# Patient Record
Sex: Male | Born: 1940 | Race: White | Hispanic: No | Smoking: Former smoker
Health system: Southern US, Community
[De-identification: ages and names within clinical notes are randomized; demographics above are authoritative.]

## PROBLEM LIST (undated history)

## (undated) DIAGNOSIS — I351 Nonrheumatic aortic (valve) insufficiency: Secondary | ICD-10-CM

## (undated) DIAGNOSIS — R9430 Abnormal result of cardiovascular function study, unspecified: Secondary | ICD-10-CM

## (undated) DIAGNOSIS — K635 Polyp of colon: Secondary | ICD-10-CM

## (undated) DIAGNOSIS — I10 Essential (primary) hypertension: Secondary | ICD-10-CM

## (undated) DIAGNOSIS — R251 Tremor, unspecified: Secondary | ICD-10-CM

## (undated) DIAGNOSIS — I471 Supraventricular tachycardia, unspecified: Secondary | ICD-10-CM

## (undated) DIAGNOSIS — K2270 Barrett's esophagus without dysplasia: Secondary | ICD-10-CM

## (undated) DIAGNOSIS — C439 Malignant melanoma of skin, unspecified: Secondary | ICD-10-CM

## (undated) DIAGNOSIS — Z86010 Personal history of colonic polyps: Secondary | ICD-10-CM

## (undated) DIAGNOSIS — I340 Nonrheumatic mitral (valve) insufficiency: Secondary | ICD-10-CM

## (undated) DIAGNOSIS — I480 Paroxysmal atrial fibrillation: Secondary | ICD-10-CM

## (undated) DIAGNOSIS — N400 Enlarged prostate without lower urinary tract symptoms: Secondary | ICD-10-CM

## (undated) DIAGNOSIS — I4510 Unspecified right bundle-branch block: Secondary | ICD-10-CM

## (undated) DIAGNOSIS — IMO0002 Reserved for concepts with insufficient information to code with codable children: Secondary | ICD-10-CM

## (undated) DIAGNOSIS — N200 Calculus of kidney: Secondary | ICD-10-CM

## (undated) DIAGNOSIS — K219 Gastro-esophageal reflux disease without esophagitis: Secondary | ICD-10-CM

## (undated) HISTORY — DX: Essential (primary) hypertension: I10

## (undated) HISTORY — DX: Tremor, unspecified: R25.1

## (undated) HISTORY — DX: Barrett's esophagus without dysplasia: K22.70

## (undated) HISTORY — DX: Benign prostatic hyperplasia without lower urinary tract symptoms: N40.0

## (undated) HISTORY — DX: Unspecified right bundle-branch block: I45.10

## (undated) HISTORY — DX: Reserved for concepts with insufficient information to code with codable children: IMO0002

## (undated) HISTORY — PX: NISSEN FUNDOPLICATION: SHX2091

## (undated) HISTORY — DX: Calculus of kidney: N20.0

## (undated) HISTORY — DX: Supraventricular tachycardia, unspecified: I47.10

## (undated) HISTORY — DX: Abnormal result of cardiovascular function study, unspecified: R94.30

## (undated) HISTORY — DX: Gastro-esophageal reflux disease without esophagitis: K21.9

## (undated) HISTORY — DX: Paroxysmal atrial fibrillation: I48.0

## (undated) HISTORY — DX: Nonrheumatic aortic (valve) insufficiency: I35.1

## (undated) HISTORY — DX: Polyp of colon: K63.5

## (undated) HISTORY — DX: Supraventricular tachycardia: I47.1

## (undated) HISTORY — DX: Personal history of colonic polyps: Z86.010

## (undated) HISTORY — DX: Nonrheumatic mitral (valve) insufficiency: I34.0

## (undated) HISTORY — DX: Malignant melanoma of skin, unspecified: C43.9

---

## 1964-02-26 HISTORY — PX: TONSILLECTOMY: SUR1361

## 1979-02-26 HISTORY — PX: KIDNEY STONE SURGERY: SHX686

## 1979-02-26 HISTORY — PX: VASECTOMY: SHX75

## 1990-02-25 HISTORY — PX: OTHER SURGICAL HISTORY: SHX169

## 1998-02-14 ENCOUNTER — Other Ambulatory Visit: Admission: RE | Admit: 1998-02-14 | Discharge: 1998-02-14 | Payer: Self-pay | Admitting: Gastroenterology

## 1998-08-22 ENCOUNTER — Encounter (INDEPENDENT_AMBULATORY_CARE_PROVIDER_SITE_OTHER): Payer: Self-pay | Admitting: Specialist

## 1998-08-22 ENCOUNTER — Other Ambulatory Visit: Admission: RE | Admit: 1998-08-22 | Discharge: 1998-08-22 | Payer: Self-pay | Admitting: Gastroenterology

## 1999-01-30 ENCOUNTER — Other Ambulatory Visit: Admission: RE | Admit: 1999-01-30 | Discharge: 1999-01-30 | Payer: Self-pay | Admitting: Gastroenterology

## 1999-01-30 ENCOUNTER — Encounter (INDEPENDENT_AMBULATORY_CARE_PROVIDER_SITE_OTHER): Payer: Self-pay

## 1999-02-08 ENCOUNTER — Inpatient Hospital Stay (HOSPITAL_COMMUNITY): Admission: EM | Admit: 1999-02-08 | Discharge: 1999-02-09 | Payer: Self-pay | Admitting: Emergency Medicine

## 1999-02-13 ENCOUNTER — Encounter (INDEPENDENT_AMBULATORY_CARE_PROVIDER_SITE_OTHER): Payer: Self-pay

## 1999-02-13 ENCOUNTER — Observation Stay (HOSPITAL_COMMUNITY): Admission: RE | Admit: 1999-02-13 | Discharge: 1999-02-14 | Payer: Self-pay

## 1999-02-13 HISTORY — PX: CHOLECYSTECTOMY: SHX55

## 1999-03-02 ENCOUNTER — Encounter: Payer: Self-pay | Admitting: Gastroenterology

## 1999-03-02 ENCOUNTER — Ambulatory Visit (HOSPITAL_COMMUNITY): Admission: RE | Admit: 1999-03-02 | Discharge: 1999-03-02 | Payer: Self-pay | Admitting: Gastroenterology

## 1999-03-13 ENCOUNTER — Ambulatory Visit (HOSPITAL_COMMUNITY): Admission: RE | Admit: 1999-03-13 | Discharge: 1999-03-13 | Payer: Self-pay | Admitting: Cardiovascular Disease

## 1999-08-07 ENCOUNTER — Encounter (INDEPENDENT_AMBULATORY_CARE_PROVIDER_SITE_OTHER): Payer: Self-pay | Admitting: Specialist

## 1999-08-07 ENCOUNTER — Other Ambulatory Visit: Admission: RE | Admit: 1999-08-07 | Discharge: 1999-08-07 | Payer: Self-pay | Admitting: Gastroenterology

## 2000-02-07 ENCOUNTER — Encounter (INDEPENDENT_AMBULATORY_CARE_PROVIDER_SITE_OTHER): Payer: Self-pay | Admitting: Specialist

## 2000-02-07 ENCOUNTER — Other Ambulatory Visit: Admission: RE | Admit: 2000-02-07 | Discharge: 2000-02-07 | Payer: Self-pay | Admitting: Gastroenterology

## 2000-07-30 ENCOUNTER — Encounter (INDEPENDENT_AMBULATORY_CARE_PROVIDER_SITE_OTHER): Payer: Self-pay | Admitting: Specialist

## 2000-07-30 ENCOUNTER — Other Ambulatory Visit: Admission: RE | Admit: 2000-07-30 | Discharge: 2000-07-30 | Payer: Self-pay | Admitting: Gastroenterology

## 2002-08-03 ENCOUNTER — Encounter: Payer: Self-pay | Admitting: Internal Medicine

## 2004-01-13 ENCOUNTER — Encounter: Admission: RE | Admit: 2004-01-13 | Discharge: 2004-01-13 | Payer: Self-pay | Admitting: Surgery

## 2004-01-26 ENCOUNTER — Ambulatory Visit: Payer: Self-pay | Admitting: Internal Medicine

## 2004-02-13 ENCOUNTER — Ambulatory Visit: Payer: Self-pay | Admitting: Internal Medicine

## 2004-03-12 ENCOUNTER — Ambulatory Visit: Payer: Self-pay | Admitting: Internal Medicine

## 2004-05-16 ENCOUNTER — Ambulatory Visit: Payer: Self-pay | Admitting: Internal Medicine

## 2004-05-18 ENCOUNTER — Ambulatory Visit: Payer: Self-pay | Admitting: Cardiology

## 2004-08-16 ENCOUNTER — Ambulatory Visit: Payer: Self-pay | Admitting: Internal Medicine

## 2004-08-24 ENCOUNTER — Ambulatory Visit: Payer: Self-pay | Admitting: Internal Medicine

## 2005-05-24 ENCOUNTER — Encounter: Payer: Self-pay | Admitting: Cardiology

## 2005-05-24 ENCOUNTER — Ambulatory Visit: Payer: Self-pay | Admitting: Cardiology

## 2005-05-27 ENCOUNTER — Ambulatory Visit: Payer: Self-pay | Admitting: Cardiology

## 2005-06-05 ENCOUNTER — Ambulatory Visit: Payer: Self-pay | Admitting: Internal Medicine

## 2005-06-10 ENCOUNTER — Ambulatory Visit: Payer: Self-pay | Admitting: Gastroenterology

## 2005-08-13 ENCOUNTER — Ambulatory Visit: Payer: Self-pay | Admitting: Internal Medicine

## 2005-08-22 ENCOUNTER — Ambulatory Visit: Payer: Self-pay | Admitting: Internal Medicine

## 2006-04-17 ENCOUNTER — Ambulatory Visit: Payer: Self-pay | Admitting: Family Medicine

## 2006-06-11 ENCOUNTER — Ambulatory Visit: Payer: Self-pay | Admitting: Cardiology

## 2006-10-13 DIAGNOSIS — Z860100 Personal history of colon polyps, unspecified: Secondary | ICD-10-CM

## 2006-10-13 HISTORY — DX: Personal history of colonic polyps: Z86.010

## 2006-10-13 HISTORY — DX: Personal history of colon polyps, unspecified: Z86.0100

## 2007-05-06 ENCOUNTER — Encounter: Payer: Self-pay | Admitting: Internal Medicine

## 2007-06-04 ENCOUNTER — Ambulatory Visit: Payer: Self-pay | Admitting: Cardiology

## 2007-07-17 ENCOUNTER — Encounter: Payer: Self-pay | Admitting: Internal Medicine

## 2007-07-30 ENCOUNTER — Ambulatory Visit: Payer: Self-pay | Admitting: Internal Medicine

## 2007-07-30 DIAGNOSIS — K219 Gastro-esophageal reflux disease without esophagitis: Secondary | ICD-10-CM | POA: Insufficient documentation

## 2007-07-30 DIAGNOSIS — K22719 Barrett's esophagus with dysplasia, unspecified: Secondary | ICD-10-CM

## 2008-01-25 ENCOUNTER — Encounter: Payer: Self-pay | Admitting: Internal Medicine

## 2008-05-20 ENCOUNTER — Ambulatory Visit: Payer: Self-pay | Admitting: Internal Medicine

## 2008-05-31 DIAGNOSIS — N400 Enlarged prostate without lower urinary tract symptoms: Secondary | ICD-10-CM

## 2008-05-31 DIAGNOSIS — Z85820 Personal history of malignant melanoma of skin: Secondary | ICD-10-CM

## 2008-05-31 DIAGNOSIS — Z87442 Personal history of urinary calculi: Secondary | ICD-10-CM

## 2008-06-01 ENCOUNTER — Ambulatory Visit: Payer: Self-pay | Admitting: Cardiology

## 2008-06-01 ENCOUNTER — Encounter: Payer: Self-pay | Admitting: Cardiology

## 2008-06-21 ENCOUNTER — Encounter: Payer: Self-pay | Admitting: Internal Medicine

## 2008-06-21 ENCOUNTER — Ambulatory Visit: Payer: Self-pay | Admitting: Internal Medicine

## 2008-06-23 ENCOUNTER — Encounter: Payer: Self-pay | Admitting: Internal Medicine

## 2009-01-30 ENCOUNTER — Encounter: Payer: Self-pay | Admitting: Internal Medicine

## 2009-01-30 ENCOUNTER — Encounter: Payer: Self-pay | Admitting: Cardiology

## 2009-02-25 HISTORY — PX: MOHS SURGERY: SUR867

## 2009-04-19 ENCOUNTER — Encounter (INDEPENDENT_AMBULATORY_CARE_PROVIDER_SITE_OTHER): Payer: Self-pay | Admitting: *Deleted

## 2009-06-06 ENCOUNTER — Encounter: Payer: Self-pay | Admitting: Cardiology

## 2009-06-07 ENCOUNTER — Ambulatory Visit: Payer: Self-pay | Admitting: Cardiology

## 2009-08-07 ENCOUNTER — Encounter: Payer: Self-pay | Admitting: Internal Medicine

## 2009-08-14 ENCOUNTER — Encounter: Payer: Self-pay | Admitting: Internal Medicine

## 2009-08-18 ENCOUNTER — Telehealth: Payer: Self-pay | Admitting: Internal Medicine

## 2009-09-07 ENCOUNTER — Ambulatory Visit: Payer: Self-pay | Admitting: Internal Medicine

## 2009-09-07 LAB — CONVERTED CEMR LAB
ALT: 19 units/L (ref 0–53)
AST: 22 units/L (ref 0–37)
Albumin: 4.2 g/dL (ref 3.5–5.2)
Alkaline Phosphatase: 54 units/L (ref 39–117)
BUN: 19 mg/dL (ref 6–23)
Basophils Absolute: 0 10*3/uL (ref 0.0–0.1)
Basophils Relative: 0.9 % (ref 0.0–3.0)
Bilirubin Urine: NEGATIVE
Bilirubin, Direct: 0.1 mg/dL (ref 0.0–0.3)
CO2: 30 meq/L (ref 19–32)
Calcium: 9.3 mg/dL (ref 8.4–10.5)
Chloride: 106 meq/L (ref 96–112)
Cholesterol: 184 mg/dL (ref 0–200)
Creatinine, Ser: 0.8 mg/dL (ref 0.4–1.5)
Eosinophils Absolute: 0.2 10*3/uL (ref 0.0–0.7)
Eosinophils Relative: 4.6 % (ref 0.0–5.0)
GFR calc non Af Amer: 104.78 mL/min (ref >60)
Glucose, Bld: 91 mg/dL (ref 70–99)
HCT: 40.2 % (ref 39.0–52.0)
HDL: 63 mg/dL (ref >39.00)
Hemoglobin: 13.7 g/dL (ref 13.0–17.0)
Ketones, ur: NEGATIVE mg/dL
LDL Cholesterol: 100 mg/dL — ABNORMAL HIGH (ref 0–99)
Leukocytes, UA: NEGATIVE
Lymphocytes Relative: 36.2 % (ref 12.0–46.0)
Lymphs Abs: 1.4 10*3/uL (ref 0.7–4.0)
MCHC: 34 g/dL (ref 30.0–36.0)
MCV: 93.8 fL (ref 78.0–100.0)
Monocytes Absolute: 0.4 10*3/uL (ref 0.1–1.0)
Monocytes Relative: 10 % (ref 3.0–12.0)
Neutro Abs: 1.9 10*3/uL (ref 1.4–7.7)
Neutrophils Relative %: 48.3 % (ref 43.0–77.0)
Nitrite: NEGATIVE
PSA: 0.89 ng/mL (ref 0.10–4.00)
Platelets: 142 10*3/uL — ABNORMAL LOW (ref 150.0–400.0)
Potassium: 5 meq/L (ref 3.5–5.1)
RBC: 4.29 M/uL (ref 4.22–5.81)
RDW: 13 % (ref 11.5–14.6)
Sodium: 142 meq/L (ref 135–145)
Specific Gravity, Urine: 1.01 (ref 1.000–1.030)
TSH: 1.39 microintl units/mL (ref 0.35–5.50)
Total Bilirubin: 0.6 mg/dL (ref 0.3–1.2)
Total CHOL/HDL Ratio: 3
Total Protein, Urine: NEGATIVE mg/dL
Total Protein: 6.7 g/dL (ref 6.0–8.3)
Triglycerides: 105 mg/dL (ref 0.0–149.0)
Urine Glucose: NEGATIVE mg/dL
Urobilinogen, UA: 0.2 (ref 0.0–1.0)
VLDL: 21 mg/dL (ref 0.0–40.0)
WBC: 3.9 10*3/uL — ABNORMAL LOW (ref 4.5–10.5)
pH: 7 (ref 5.0–8.0)

## 2009-09-21 ENCOUNTER — Ambulatory Visit: Payer: Self-pay | Admitting: Internal Medicine

## 2009-10-26 ENCOUNTER — Ambulatory Visit: Payer: Self-pay | Admitting: Internal Medicine

## 2010-02-25 HISTORY — PX: MENISCUS REPAIR: SHX5179

## 2010-03-27 NOTE — Assessment & Plan Note (Signed)
Summary: CPX/CJR   Vital Signs:  Patient profile:   70 year old male Height:      68 inches Weight:      164 pounds Temp:     98.1 degrees F oral Pulse rate:   48 / minute Pulse rhythm:   regular BP sitting:   150 / 86  (left arm) Cuff size:   regular  Vitals Entered By: Kern Reap CMA Duncan Dull) (September 21, 2009 9:51 AM) CC: yearly physical Is Patient Diabetic? No Pain Assessment Patient in pain? no        Primary Care Provider:  Birdie Sons MD  CC:  yearly physical.  History of Present Illness: Here for Medicare AWV:  1.   Risk factors based on Past M, S, F history: see list 2.   Physical Activities: he is able to do all 3.   Depression/mood: pt denies 4.   Hearing: pt denies any complaint 5.   ADL's: --pt is able to do all 6.   Fall Risk: no concerns 7.   Home Safety: pt denies any concerns 8.   Height, weight, &visual acuity:see exam, pt has regular eye exam 9.   Counseling: advised regular eye exam 10.   Labs ordered based on risk factors: seee orders 11.           Referral Coordination: none necessary 12.           Care Plan: advised regular f/u with GI , daily exercise,  13.            Cognitive Assessment : pt is able to carry on complicated conversations, he understands all of his medical hx. 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): has regular f/u with dermatology GERD (ICD-530.81):he has known barretts, tolerating PPI without any sxs HYPERTENSION (ICD-401.9): tolerating meds  All other systems reviewed and were negative       Preventive Screening-Counseling & Management  Alcohol-Tobacco     Smoking Status: quit > 6 months     Year Quit: 1977  Allergies (verified): No Known Drug Allergies  Past History:  Past Surgical History: Last updated: 05/31/2008 INGUINAL  HERNIORRHAPHY, HX OF (ICD-V45.89) VASECTOMY, HX OF (ICD-V26.52) CHOLECYSTECTOMY, HX OF (ICD-V45.79)  Social History: Last updated: 10/13/2006 Retired Married Former Smoker  Risk Factors: Smoking Status: quit > 6 months (09/21/2009)  Past Medical History: NEPHROLITHIASIS, HX OF (ICD-V13.01) BENIGN PROSTATIC HYPERTROPHY, HX OF (ICD-V13.8) ATRIAL FIBRILLATION, HX OF ONE EPISODE. 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   Head:  normocephalic and atraumatic.   Eyes:  pupils equal and pupils round.   Ears:  R ear normal and L ear normal.   Neck:  no jugular venous distention Chest Wall:  there is no chest wall tenderness Lungs:  normal respiratory effort and no intercostal retractions.   Heart:  normal rate and regular rhythm.   Abdomen:  abdomen is soft. Rectal:  no external abnormalities and normal sphincter tone.   Prostate:  no nodules and no asymmetry.   Msk:  no musculoskeletal deformities. Pulses:  R radial normal and L radial normal.   Neurologic:  cranial nerves II-XII intact and gait normal.     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  (ICD-V12.59) no need for treatment  Problem # 4:  HYPERTENSION (ICD-401.9) will aim for better control try different beta blocker---may help tremor The following medications were removed from the medication list:    Atenolol 50 Mg Tabs (Atenolol) .Marland Kitchen... 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 One by mouth bid 2)  Claritin 10 Mg Caps (Loratadine) .... As needed 3)  Tylenol  .... As needed 4)  Propranolol Hcl 20 Mg Tabs (Propranolol hcl) .... Two times a day  Other Orders: TD Toxoids IM 7 YR + (60454) Pneumococcal Vaccine (09811) Admin 1st Vaccine (91478) Admin of Any Addtl Vaccine (29562)  Preventive Care Screening  Colonoscopy:    Date:  08/03/2002    Next Due:  07/2012    Results:  normal   Last Pneumovax:    Date:  09/21/2009    Results:  Pneumovax  Last Tetanus Booster:    Date:  09/21/2009    Results:  Td   Patient Instructions: 1)  see me 6 weeks---followup tremor Prescriptions: PROPRANOLOL HCL 20 MG TABS (PROPRANOLOL HCL) two times a day  #60 x 11   Entered and Authorized by:   Birdie Sons MD   Signed by:   Birdie Sons MD on 09/21/2009   Method used:   Electronically to        [facility]* (retail)       [address]       [place], [place]  [id number]       Ph: [phone]       Fax: [phone]   RxID:   [id number]    Immunizations Administered:  Tetanus Vaccine:     Vaccine Type: Td    Site: left deltoid    Mfr: Sanofi Pasteur    Dose: 0.5 ml    Route: IM    Given by: Kern Reap CMA (AAMA)    Exp. Date: 04/29/2010    Lot #: [id number]    Physician counseled: yes  Pneumonia Vaccine:    Vaccine Type: Pneumovax    Site: right deltoid    Mfr: Merck    Dose: 0.5 ml    Route: IM    Given by: Kern Reap CMA (AAMA)    Exp. Date: 02/24/2011    Lot #: [id number]    Physician counseled: yes    Appended Document: CPX/CJR family hx is noncontributory

## 2010-03-27 NOTE — Assessment & Plan Note (Signed)
Summary: Larry Mejia   Visit Type:  Follow-up Primary Provider:  Birdie Sons MD  CC:  supraventricular tachycardia.  History of Present Illness: The patient returns for followup of supraventricular tachycardia.  He has very rare palpitations.  He is not limited by this.  He noted some very infrequent left chest discomfort.  This occurs in 3 separate spots.  It is always at rest.  It does not sound like angina.  Current Medications (verified): 1)  Atenolol 50 Mg Tabs (Atenolol) .... Take 1/2 Tablet By Mouth Once A Day 2)  Nexium 40 Mg  Cpdr (Esomeprazole Magnesium) .... One By Mouth Bid 3)  Claritin 10 Mg Caps (Loratadine) .... As Needed 4)  Tylenol .... 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  All other systems are reviewed and are negative.  Vital Signs:  Patient profile:   70 year old male Height:      71 inches Weight:      163 pounds BMI:     22.82 Pulse rate:   42 / minute BP sitting:   144 / 72  (left arm) Cuff size:   regular  Vitals Entered By: Hardin Negus, RMA (June 07, 2009 11:32 AM)  Physical Exam  General:  patient is  stable in general. Head:  head is atraumatic Eyes:  no xanthelasma Neck:  no jugular venous distention Chest Wall:  there is no chest wall tenderness Lungs:  lungs are clear.  Respiratory effort is nonlabored. Heart:  cardiac exam reveals S1 and S2.  No clicks or significant murmurs. Abdomen:  abdomen is soft. Msk:  no musculoskeletal deformities. Extremities:    No peripheral edema. Skin:  patient has several areas of skin changes probably related to sun exposure. Psych:  patient is oriented to person time and place.  Affect is normal.   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 Take 1/2 tablet by mouth once a day EKG is done and reviewed by me.  He has no right bundle branch block.  Problem # 2:  ATRIAL FIBRILLATION, HX OF ONE EPISODE. (ICD-V12.59)  His updated medication list for this problem includes:    Atenolol 50 Mg Tabs (Atenolol) .Marland Kitchen... Take 1/2 tablet by mouth once a day  Orders: EKG w/ Interpretation (93000) The patient has not had any recurrent atrial fibrillation.  Problem # 3:  MITRAL REGURGITATION (ICD-396.3) The patient has mild mitral regurgitation the past.  His last echo was done in 2007 .  When I see him next year we'll consider followup echo.  Problem # 4:  HYPERTENSION (ICD-401.9)  His updated medication list for this problem includes:    Atenolol 50 Mg Tabs (Atenolol) .Marland Kitchen... Take 1/2 tablet by mouth once a day Blood pressure is under good  control.  No change in therapy.  Problem # 5:  CHEST PAIN (ICD-786.50)  His updated medication list for this problem includes:    Atenolol 50 Mg Tabs (Atenolol) .Marland Kitchen... Take 1/2 tablet by mouth once a day The patient describes some vague chest discomfort.  It is not with exertion.  It is very localized and 3 spots and does not appear to be compatible with anemia.  No other workup is needed at this  time.  Patient Instructions: 1)  Follow up in 1 year

## 2010-03-27 NOTE — Procedures (Signed)
Summary: Upper GI Endoscopy/[facility]   [facility] Endoscopy/[facility]   Imported By: Sherian Rein 08/17/2009 09:12:22  _____________________________________________________________________  External Attachment:    Type:   Image     Comment:   External Document

## 2010-03-27 NOTE — Progress Notes (Signed)
Summary: REQUEST FOR RX (Atneolol) CPX appt has been scheduled.  Phone Note Refill Request Message from:  Patient on August 18, 2009 9:42 AM  Refills Requested: Medication #1:  ATENOLOL 50 MG TABS Take 1/2 tablet by mouth once a day--NEEDS OFFICE VISIT FOR ADDITIONAL REFILLS   Notes: [employer].... Pt has cpx appt wtih Dr Cato Mulligan on September 21, 2009.Marland KitchenMarland KitchenMarland KitchenRequest enough medication to last him till his appt.    Initial call taken by: Debbra Riding,  August 18, 2009 9:43 AM  Follow-up for Phone Call        See Rx.wife notified. Follow-up by: Gladis Riffle, RN,  August 18, 2009 12:46 PM    Prescriptions: ATENOLOL 50 MG TABS (ATENOLOL) Take 1/2 tablet by mouth once a day--NEEDS OFFICE VISIT FOR ADDITIONAL REFILLS  #30 x 1   Entered by:   Gladis Riffle, RN   Authorized by:   Birdie Sons MD   Signed by:   Gladis Riffle, RN on 08/18/2009   Method used:   Electronically to        [facility]* (retail)       [address]       [place], [place]  [id number]       Ph: [phone]       Fax: [phone]   RxID:   [id number]

## 2010-03-27 NOTE — Procedures (Signed)
Summary: Colonoscopy/[facility]  Colonoscopy/[facility]   Imported By: Lanelle Bal 09/22/2009 09:26:11  _____________________________________________________________________  External Attachment:    Type:   Image     Comment:   External Document

## 2010-03-27 NOTE — Letter (Signed)
Summary: [facility]  [facility]   Imported By: Maryln Gottron 08/31/2009 12:24:03  _____________________________________________________________________  External Attachment:    Type:   Image     Comment:   External Document

## 2010-03-27 NOTE — Miscellaneous (Signed)
  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

## 2010-03-27 NOTE — Letter (Signed)
Summary: Appointment - Reminder 2  [facility], Main Office  [address]   [place], [place] [address]   Phone: [phone]  Fax: [phone]     April 19, 2009 MRN: [id number]   Larry Mejia [address] [place], [place]  [address]   Dear Mr. SHIPPER,  Our records indicate that it is time to schedule a follow-up appointment with Dr. Myrtis Ser. It is very important that we reach you to schedule this appointment. We look forward to participating in your health care needs. Please contact us at the number listed above at your earliest convenience to schedule your appointment.  If you are unable to make an appointment at this time, give [place] a call so we can update our records.     Sincerely,   Glass blower/designer

## 2010-03-27 NOTE — Assessment & Plan Note (Signed)
Summary: 6WK Follow up/cb pt rsc/njr   Vital Signs:  Patient profile:   71 year old male Weight:      164 pounds Temp:     98.2 degrees F oral Pulse rate:   48 / minute Pulse rhythm:   regular Resp:     12 per minute BP sitting:   142 / 68  (left arm) Cuff size:   regular  Vitals Entered By: Gladis Riffle, RN (October 26, 2009 9:30 AM)  Serial Vital Signs/Assessments:  Time      Position  BP       Pulse  Resp  Temp     By                     115/65                         Birdie Sons MD  CC: 6 week rov--BP 117/71 last check at home--thinks BP may be too low as "has to be careful" when stands Is Patient Diabetic? No   Primary Care Provider:  Birdie Sons MD  CC:  6 week rov--BP 117/71 last check at home--thinks BP may be too low as "has to be careful" when stands.  History of Present Illness:  Follow-Up Visit      This is a 70 year old man who presents for Follow-up visit.  The patient denies chest pain and palpitations.  Since the last visit the patient notes no new problems or concerns.  The patient reports taking meds as prescribed.  When questioned about possible medication side effects, the patient notes none except orthostatic light headedness.    no CP, SOB, PND  Preventive Screening-Counseling & Management  Alcohol-Tobacco     Smoking Status: quit > 6 months     Year Quit: 1977  Current Medications (verified): 1)  Nexium 40 Mg  Cpdr (Esomeprazole Magnesium) .... One By Mouth Bid 2)  Claritin 10 Mg Caps (Loratadine) .... As Needed 3)  Tylenol .... As Needed 4)  Propranolol Hcl 20 Mg Tabs (Propranolol Hcl) .... Two Times A Day  Allergies (verified): No Known Drug Allergies  Past History:  Past Medical History: Last updated: 09/21/2009 NEPHROLITHIASIS, HX OF (ICD-V13.01) BENIGN PROSTATIC HYPERTROPHY, HX OF (ICD-V13.8) ATRIAL FIBRILLATION, HX OF ONE EPISODE. 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   Head:  normocephalic and atraumatic.   Eyes:  pupils equal and pupils round.   Ears:  R ear normal and L ear normal.   Neck:  No deformities, masses, or tenderness noted. Lungs:  Normal respiratory effort, chest expands symmetrically. Lungs are clear to auscultation, no crackles or wheezes. Heart:  normal rate and regular rhythm.     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 1/2 two times a day  BP today: 142/68 Prior BP: 150/86 (09/21/2009)  Labs Reviewed: K+: 5.0 (09/07/2009) Creat: : 0.8 (09/07/2009)   Chol: 184 (09/07/2009)   HDL: 63.00 (09/07/2009)   LDL: 100 (09/07/2009)   TG: 105.0 (09/07/2009)  Complete Medication List: 1)  Nexium 40 Mg Cpdr (Esomeprazole magnesium) .... One by mouth bid 2)  Claritin 10 Mg Caps (Loratadine) .... As needed 3)  Propranolol Hcl 20 Mg Tabs (Propranolol hcl) .... 1/2 two times a day  Patient Instructions: 1)  Please schedule a follow-up appointment in 6 months.

## 2010-06-14 ENCOUNTER — Ambulatory Visit: Payer: Self-pay | Admitting: Cardiology

## 2010-07-07 ENCOUNTER — Encounter: Payer: Self-pay | Admitting: *Deleted

## 2010-07-07 ENCOUNTER — Encounter: Payer: Self-pay | Admitting: Cardiology

## 2010-07-09 ENCOUNTER — Encounter: Payer: Self-pay | Admitting: Cardiology

## 2010-07-09 DIAGNOSIS — Z8679 Personal history of other diseases of the circulatory system: Secondary | ICD-10-CM | POA: Insufficient documentation

## 2010-07-09 DIAGNOSIS — R9430 Abnormal result of cardiovascular function study, unspecified: Secondary | ICD-10-CM | POA: Insufficient documentation

## 2010-07-09 DIAGNOSIS — I10 Essential (primary) hypertension: Secondary | ICD-10-CM | POA: Insufficient documentation

## 2010-07-09 DIAGNOSIS — I340 Nonrheumatic mitral (valve) insufficiency: Secondary | ICD-10-CM | POA: Insufficient documentation

## 2010-07-09 DIAGNOSIS — G250 Essential tremor: Secondary | ICD-10-CM | POA: Insufficient documentation

## 2010-07-09 DIAGNOSIS — I351 Nonrheumatic aortic (valve) insufficiency: Secondary | ICD-10-CM | POA: Insufficient documentation

## 2010-07-09 DIAGNOSIS — I480 Paroxysmal atrial fibrillation: Secondary | ICD-10-CM | POA: Insufficient documentation

## 2010-07-09 DIAGNOSIS — I4510 Unspecified right bundle-branch block: Secondary | ICD-10-CM | POA: Insufficient documentation

## 2010-07-10 ENCOUNTER — Ambulatory Visit: Payer: Medicare Other | Admitting: Cardiology

## 2010-07-10 ENCOUNTER — Encounter: Payer: Self-pay | Admitting: Cardiology

## 2010-07-10 ENCOUNTER — Ambulatory Visit (INDEPENDENT_AMBULATORY_CARE_PROVIDER_SITE_OTHER): Payer: Medicare Other | Admitting: Cardiology

## 2010-07-10 VITALS — BP 130/84 | HR 51 | Resp 18 | Ht 70.0 in | Wt 164.1 lb

## 2010-07-10 DIAGNOSIS — I4510 Unspecified right bundle-branch block: Secondary | ICD-10-CM

## 2010-07-10 DIAGNOSIS — R079 Chest pain, unspecified: Secondary | ICD-10-CM

## 2010-07-10 DIAGNOSIS — I471 Supraventricular tachycardia: Secondary | ICD-10-CM

## 2010-07-10 DIAGNOSIS — I4891 Unspecified atrial fibrillation: Secondary | ICD-10-CM

## 2010-07-10 DIAGNOSIS — I10 Essential (primary) hypertension: Secondary | ICD-10-CM

## 2010-07-10 DIAGNOSIS — R259 Unspecified abnormal involuntary movements: Secondary | ICD-10-CM

## 2010-07-10 DIAGNOSIS — I340 Nonrheumatic mitral (valve) insufficiency: Secondary | ICD-10-CM

## 2010-07-10 DIAGNOSIS — I480 Paroxysmal atrial fibrillation: Secondary | ICD-10-CM

## 2010-07-10 DIAGNOSIS — R251 Tremor, unspecified: Secondary | ICD-10-CM

## 2010-07-10 DIAGNOSIS — R072 Precordial pain: Secondary | ICD-10-CM

## 2010-07-10 NOTE — Assessment & Plan Note (Signed)
The patient has a mild tremor.  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  His atenolol was changed to Inderal very low dose.  This is to be followed over time.

## 2010-07-10 NOTE — Assessment & Plan Note (Signed)
The patient still has some mild palpitations.  These really do not bother him.  I've considered placing a monitor to see if he has any atrial fibrillation.  His CHADS score is low.  I feel we do not need to push with this information at this time.

## 2010-07-10 NOTE — Assessment & Plan Note (Signed)
He had mild MR in the past.  We will assess his valves further at the time of his echo and a stress echo.

## 2010-07-10 NOTE — Assessment & Plan Note (Signed)
Blood pressure is controlled. No change in therapy. 

## 2010-07-10 NOTE — Assessment & Plan Note (Signed)
At this point there is no definite evidence of ischemic heart disease.  However the patient does have right bundle branch block and has had some chest discomfort.  I am scheduling him for a stress echo to reassess his underlying LV function and to see his response to stress.  I am aware that he has right bundle branch block but I decided that we can still proceed with stress echo.  I will be in touch with him with the information.

## 2010-07-10 NOTE — Patient Instructions (Signed)
You are being scheduled for a stress Echo.  Please follow the instructions given.  You will be contacted with the results of the testing once read.   Please continue all current medications. Follow up with Dr Myrtis Ser in 1 year.

## 2010-07-10 NOTE — Assessment & Plan Note (Signed)
Patient right bundle-branch block.  This is old.  There no obvious significant Q waves.

## 2010-07-10 NOTE — Progress Notes (Signed)
HPI Patient is seen for cardiology followup.  I saw him last April, 2011.  In the past he had some supraventricular tachycardia that was not atrial fibrillation.  He did have some very brief atrial fibrillation on one Limited episode in the remote past.  Over time he has rare palpitations.  He tolerates these and there has been no reason to adjust his medicines.  I have been assuming that this is not atrial fibrillation.  The patient had some localized chest discomfort.  It is not necessarily related to exercise.  There was no nausea vomiting or diaphoresis.  It has not limited his activity.  It is of note that after aggressive treatment for his Barrett's esophagus in the past he now only has to have a biopsy once yearly.  This is quite good for him. No Known Allergies  Current Outpatient Prescriptions  Medication Sig Dispense Refill  . acetaminophen (TYLENOL) 325 MG tablet Take 650 mg by mouth every 6 (six) hours as needed.        Marland Kitchen esomeprazole (NEXIUM) 40 MG capsule Take 40 mg by mouth 2 (two) times daily.       Marland Kitchen loratadine (CLARITIN) 10 MG tablet Take 10 mg by mouth as needed.       . propranolol (INDERAL) 10 MG tablet Take 10 mg by mouth. Take 1/2 tablet twice a day       . DISCONTD: propranolol (INDERAL) 20 MG tablet 1/2 tab po bid         History   Social History  . Marital Status: Married    Spouse Name: N/A    Number of Children: N/A  . Years of Education: N/A   Occupational History  . Not on file.   Social History Main Topics  . Smoking status: Former Games developer  . Smokeless tobacco: Not on file  . Alcohol Use: No  . Drug Use: No  . Sexually Active: Not on file   Other Topics Concern  . Not on file   Social History Narrative  . No narrative on file    No family history on file.  Past Medical History  Diagnosis Date  . Nephrolithiasis   . BPH (benign prostatic hypertrophy)   . Paroxysmal atrial fibrillation     One episode remotely  . Mitral regurgitation    Mild, echo, 2007  . Aortic insufficiency      mild...echo.Marland KitchenMarland Kitchen3/2007....Marland KitchenMarland KitchenAortic root...upper normal ...echo.Marland KitchenMarland Kitchen3/2007  . SVT (supraventricular tachycardia)   . Malignant melanoma of other specified sites of skin   . GERD (gastroesophageal reflux disease)   . Barrett esophagus   . HTN (hypertension)   . Colonic polyp   . RBBB (right bundle branch block)   . Tremor     Essential tremor  . Ejection fraction     55-60% EF, echo, 2007    Past Surgical History  Procedure Date  . Inguinal hernia repair   . Vasectomy   . Cholecystectomy     ROS  Patient denies fever, chills, headache, sweats, rash, change in vision, change in hearing, cough, nausea vomiting, urinary symptoms.  All other systems are reviewed and are negative.  PHYSICAL EXAM Patient is oriented to person time and place.  Affect is normal.  Head is atraumatic.  There is no xanthelasma.  There no carotid bruits.  Lungs are clear.  Respiratory effort is unlabored.  Cardiac exam reveals S1-S2.  There are no clicks or significant murmurs.  The abdomen is soft.  There is no peripheral  edema.  There are no musculoskeletal deformities.  There are no skin rashes. Filed Vitals:   07/10/10 0942 07/10/10 0947  BP: 130/80 130/84  Pulse:  51  Resp: 18 18  Height: 5\' 10"  (1.778 m) 5\' 10"  (1.778 m)  Weight: 164 lb 1.9 oz (74.444 kg) 164 lb 1.9 oz (74.444 kg)    EKG EKG was done today and reviewed by me.  Patient has old right bundle branch block with sinus rhythm.  ASSESSMENT & PLAN

## 2010-07-10 NOTE — Assessment & Plan Note (Signed)
[facility]                            CARDIOLOGY OFFICE NOTE   NAME:Larry Mejia, Larry Mejia                   MRN:          [id number]  DATE:06/04/2007                            DOB:          [birth year]    Larry Mejia continues to do well.  He continues to receive aggressive  research treatment for his Barrett's esophagus.  He may need to have  some radiofrequency work done to his esophagus, but he is stable.  He  had a remote episode of atrial fibrillation, and he has mild of aortic  insufficiency.  His last echocardiogram was done in March 2007 with a  normal ejection fraction and mild aortic insufficiency.  He is not  having any significant symptoms, and he is doing well.   PAST MEDICAL HISTORY:  ALLERGIES:  No known drug allergies.   MEDICATIONS:  1. Tenormin 25.  2. Nexium b.i.d.   OTHER MEDICAL PROBLEMS:  See the list below.   REVIEW OF SYSTEMS:  He really is doing well, and his review of systems  is negative.   PHYSICAL EXAMINATION:  Blood pressure is 126/84 with a pulse of 56.  The  patient is oriented to person, time and place.  Affect is normal.  HEENT:  Reveals no xanthelasma.  He has normal extraocular motion.  There are no carotid bruits.  There is no jugular venous distention.  His skin is tan.  CARDIAC EXAM:  Reveals a S1 with a S2.  There are no clicks or  significant murmurs.  LUNGS:  Are clear.  His abdomen is soft.  He has no peripheral edema.   EKG reveals old right bundle branch block.   IMPRESSION:  The patient is stable.   PROBLEMS:  1. History of significant Barrett's esophagus with dysplasia and      treated in a treatment research program at [facility].  2. History of supraventricular tachycardia, quite stable.  We      discussed Tenormin specifically.  He is on a low dose, and we will      continue it.  3. Benign prostatic hypertrophy.  4. History of renal colic in the past.  5. History of melanoma on his left  shoulder in 1994.  6. Status post cholecystectomy.  7. Episode in the past on one occasion of atrial fibrillation.  He      does not need Coumadin.  8. Good left ventricular function.  9. Mild aortic insufficiency and mild mitral regurgitation  10.Normal LV function.   Larry Mejia is stable.  He needs no further workup at this time.     Luis Abed, MD, [facility]  Electronically Signed    JDK/MedQ  DD: 06/04/2007  DT: 06/05/2007  Job #: [id number]   cc:   Valetta Mole. Swords, MD

## 2010-07-13 NOTE — Assessment & Plan Note (Signed)
[facility]                            CARDIOLOGY OFFICE NOTE   GURTAJ, RUZ                         MRN:          [id number]  DATE:06/11/2006                            DOB:          02-12-1941    Mr. Hellenbrand is doing well.  He continues to do well after his very  aggressive treatment at [facility] for his Barrett's esophagus.  He has good  LV function.  Historically he has had mild aortic insufficiency.  He had  a remote episode of atrial fibrillation and he has not had any recurring  significant symptoms.  His last echo was done in March 2007.  Ejection  fraction was 55% - 60%.  He had mild aortic regurgitation.   PAST MEDICAL HISTORY:  ALLERGIES:  HE HAS NO ALLERGIES BUT HE CAN NOT  TAKE ASPIRIN.   MEDICATIONS:  Tenormin, Nexium, and Carafate.   OTHER MEDICAL PROBLEMS:  See the list below.   REVIEW OF SYSTEMS:  He is really feeling fine and his review of systems  is negative.   PHYSICAL EXAMINATION:  Blood pressure is 118/82 with a pulse of 50.  The  patient is oriented to person, time, and place, and his affect is  normal.  There is no xanthelasma.  There is normal extraocular motion.  There are no carotid bruits.  There is no jugular venous distention.  LUNGS:  Clear.  Respiratory effort is not labored.  SKIN:  Reveals combination of tanning and some areas of lack of colored  pigment.  CARDIAC:  Reveals an S1 with an S2, there are no clicks or significant  murmurs.  ABDOMEN:  Soft.  He has normal bowel sounds.  There is no peripheral edema.   EKG revels no significant change.  He does have sinus bradycardia.   PROBLEMS:  1. History of significant Barrett's esophagus with dysplasia after      treatment with a research protocol at [facility] and this continues to be      stable.  2. History of supraventricular tachycardia, stable.  3. Benign prostatic hypertrophy.  4. History of renal colic in the past.  5. History of a melanoma in the left  shoulder 1994.  6. Status post cholecystectomy.  7. Episode remotely of one episode of atrial fibrillation around the      time of his esophageal treatment.  He is stable and does not need      Coumadin.  8. Good left ventricular function.  9. Mild aortic insufficiency and mild mitral regurgitation.  10.Normal left ventricular function.   He does not need any testing at this time.  I will see him back in one  year.     Luis Abed, MD, [facility]  Electronically Signed    JDK/MedQ  DD: 06/11/2006  DT: 06/11/2006  Job #: [id number]   cc:   Birdie Sons, MD

## 2010-07-19 ENCOUNTER — Ambulatory Visit (HOSPITAL_COMMUNITY): Payer: Medicare Other | Attending: Cardiology | Admitting: Radiology

## 2010-07-19 ENCOUNTER — Encounter: Payer: Self-pay | Admitting: Cardiology

## 2010-07-19 ENCOUNTER — Telehealth: Payer: Self-pay | Admitting: *Deleted

## 2010-07-19 ENCOUNTER — Ambulatory Visit (HOSPITAL_BASED_OUTPATIENT_CLINIC_OR_DEPARTMENT_OTHER): Payer: Medicare Other | Admitting: Radiology

## 2010-07-19 DIAGNOSIS — R072 Precordial pain: Secondary | ICD-10-CM

## 2010-07-19 DIAGNOSIS — R0989 Other specified symptoms and signs involving the circulatory and respiratory systems: Secondary | ICD-10-CM

## 2010-07-19 DIAGNOSIS — I080 Rheumatic disorders of both mitral and aortic valves: Secondary | ICD-10-CM | POA: Insufficient documentation

## 2010-07-19 DIAGNOSIS — I340 Nonrheumatic mitral (valve) insufficiency: Secondary | ICD-10-CM

## 2010-07-19 DIAGNOSIS — I4891 Unspecified atrial fibrillation: Secondary | ICD-10-CM | POA: Insufficient documentation

## 2010-07-19 DIAGNOSIS — I379 Nonrheumatic pulmonary valve disorder, unspecified: Secondary | ICD-10-CM | POA: Insufficient documentation

## 2010-07-19 MED ORDER — PROPRANOLOL HCL 20 MG PO TABS: ORAL_TABLET | ORAL | Status: DC

## 2010-07-19 NOTE — Progress Notes (Signed)
Patient here for Stress Echo and states that he is actually taking Propranolol 20 mg tablet, 1/2 BID.  medication corrected on med list

## 2010-07-19 NOTE — Telephone Encounter (Signed)
Patient here for Stress Echo and states that he is actually taking Propranolol 20 mg tablet, 1/2 BID.  Medication correction made on med list

## 2010-08-23 ENCOUNTER — Encounter: Payer: Self-pay | Admitting: Cardiology

## 2010-12-21 ENCOUNTER — Encounter: Payer: Self-pay | Admitting: Internal Medicine

## 2010-12-21 ENCOUNTER — Ambulatory Visit (INDEPENDENT_AMBULATORY_CARE_PROVIDER_SITE_OTHER): Payer: Medicare Other | Admitting: Internal Medicine

## 2010-12-21 DIAGNOSIS — I359 Nonrheumatic aortic valve disorder, unspecified: Secondary | ICD-10-CM

## 2010-12-21 DIAGNOSIS — I351 Nonrheumatic aortic (valve) insufficiency: Secondary | ICD-10-CM

## 2010-12-21 DIAGNOSIS — K2270 Barrett's esophagus without dysplasia: Secondary | ICD-10-CM

## 2010-12-21 DIAGNOSIS — I10 Essential (primary) hypertension: Secondary | ICD-10-CM

## 2010-12-21 DIAGNOSIS — R259 Unspecified abnormal involuntary movements: Secondary | ICD-10-CM

## 2010-12-21 DIAGNOSIS — R251 Tremor, unspecified: Secondary | ICD-10-CM

## 2010-12-21 LAB — BASIC METABOLIC PANEL
Calcium: 9.2 mg/dL (ref 8.4–10.5)
Creatinine, Ser: 0.8 mg/dL (ref 0.4–1.5)
GFR: 101.39 mL/min (ref >60.00)
Sodium: 141 mEq/L (ref 135–145)

## 2010-12-21 LAB — CBC WITH DIFFERENTIAL/PLATELET
Eosinophils Absolute: 0.3 10*3/uL (ref 0.0–0.7)
Hemoglobin: 13.6 g/dL (ref 13.0–17.0)
Lymphocytes Relative: 32.7 % (ref 12.0–46.0)
Lymphs Abs: 1.1 10*3/uL (ref 0.7–4.0)
Monocytes Absolute: 0.3 10*3/uL (ref 0.1–1.0)
Monocytes Relative: 9.3 % (ref 3.0–12.0)
Neutrophils Relative %: 49.5 % (ref 43.0–77.0)
RBC: 4.26 Mil/uL (ref 4.22–5.81)
WBC: 3.5 10*3/uL — ABNORMAL LOW (ref 4.5–10.5)

## 2010-12-21 LAB — LIPID PANEL
Cholesterol: 184 mg/dL (ref 0–200)
HDL: 65.8 mg/dL (ref >39.00)
Total CHOL/HDL Ratio: 3
Triglycerides: 96 mg/dL (ref 0.0–149.0)

## 2010-12-21 LAB — HEPATIC FUNCTION PANEL
ALT: 16 U/L (ref 0–53)
AST: 24 U/L (ref 0–37)
Albumin: 4.3 g/dL (ref 3.5–5.2)
Total Protein: 6.9 g/dL (ref 6.0–8.3)

## 2010-12-21 MED ORDER — TRAZODONE HCL 50 MG PO TABS: 25.0000 mg | ORAL_TABLET | Freq: Every evening | ORAL | Status: DC | PRN

## 2010-12-21 NOTE — Assessment & Plan Note (Addendum)
No clinical sxs BP Readings from Last 3 Encounters:  12/21/10 122/74  07/10/10 130/84  10/26/09 142/68   Continue same meds

## 2010-12-21 NOTE — Assessment & Plan Note (Signed)
Followed at [facility]. Has had extensive treatment  Check labs today

## 2010-12-21 NOTE — Assessment & Plan Note (Signed)
Essential tremor. Taking propranolol

## 2010-12-21 NOTE — Assessment & Plan Note (Signed)
No sxs. Will check labs today

## 2010-12-26 NOTE — Progress Notes (Signed)
  Subjective:    Patient ID: Larry Mejia, male    DOB: 06-Mar-1940, 70 y.o.   MRN: [id number]  HPI  Patient comes in for followup. He has a history of aortic insufficiency. Reviewed echocardiogram.  He is a history of hypertension previous cardiac medications without difficulty.  He has a history of paroxysmal atrial fibrillation without known recurrence.  He has a history of Barrett's esophagus followed by gastroenterology. He's had dysplasia in the past no recent recurrence.  Past Medical History  Diagnosis Date  . Nephrolithiasis   . BPH (benign prostatic hypertrophy)   . Paroxysmal atrial fibrillation     One episode remotely  . Mitral regurgitation     Mild, echo, 2007  . Aortic insufficiency      mild...echo.Marland KitchenMarland Kitchen3/2007....Marland KitchenMarland KitchenAortic root...upper normal ...echo.Marland KitchenMarland Kitchen3/2007  . SVT (supraventricular tachycardia)   . Malignant melanoma of other specified sites of skin   . GERD (gastroesophageal reflux disease)   . Barrett esophagus   . HTN (hypertension)   . Colonic polyp   . RBBB (right bundle branch block)   . Tremor     Essential tremor  . Ejection fraction     55-60% EF, echo, 2007  . Chest pain     May, 2012   Past Surgical History  Procedure Date  . Inguinal hernia repair   . Vasectomy   . Cholecystectomy     reports that he has quit smoking. He does not have any smokeless tobacco history on file. He reports that he does not drink alcohol or use illicit drugs. family history is not on file. No Known Allergies   Review of Systems     patient denies chest pain, shortness of breath, orthopnea. Denies lower extremity edema, abdominal pain, change in appetite, change in bowel movements. Patient denies rashes, musculoskeletal complaints. No other specific complaints in a complete review of systems.   Objective:   Physical Exam   well-developed well-nourished male in no acute distress. HEENT exam atraumatic, normocephalic, neck supple without jugular venous  distention. Chest clear to auscultation cardiac exam S1-S2 are regular. Abdominal exam overweight with bowel sounds, soft and nontender. Extremities no edema. Neurologic exam is alert with a normal gait.       Assessment & Plan:

## 2011-01-31 ENCOUNTER — Other Ambulatory Visit: Payer: Self-pay | Admitting: Internal Medicine

## 2011-02-27 DIAGNOSIS — M9420 Chondromalacia, unspecified site: Secondary | ICD-10-CM | POA: Diagnosis not present

## 2011-02-27 DIAGNOSIS — IMO0002 Reserved for concepts with insufficient information to code with codable children: Secondary | ICD-10-CM | POA: Diagnosis not present

## 2011-02-27 DIAGNOSIS — Y929 Unspecified place or not applicable: Secondary | ICD-10-CM | POA: Diagnosis not present

## 2011-02-27 DIAGNOSIS — X58XXXA Exposure to other specified factors, initial encounter: Secondary | ICD-10-CM | POA: Diagnosis not present

## 2011-02-27 DIAGNOSIS — M2240 Chondromalacia patellae, unspecified knee: Secondary | ICD-10-CM | POA: Diagnosis not present

## 2011-03-01 ENCOUNTER — Ambulatory Visit (INDEPENDENT_AMBULATORY_CARE_PROVIDER_SITE_OTHER): Payer: Medicare Other | Admitting: Surgery

## 2011-03-01 ENCOUNTER — Encounter (INDEPENDENT_AMBULATORY_CARE_PROVIDER_SITE_OTHER): Payer: Self-pay | Admitting: Surgery

## 2011-03-01 VITALS — BP 148/94 | HR 64 | Temp 98.4°F | Resp 18 | Ht 69.0 in | Wt 165.2 lb

## 2011-03-01 DIAGNOSIS — R1904 Left lower quadrant abdominal swelling, mass and lump: Secondary | ICD-10-CM

## 2011-03-01 NOTE — Progress Notes (Signed)
  CC: Possible inguinal hernia HPI: This patient is a recovering from recent arthroscopic surgery on his right knee. He has had a area in the left inguinal canal it was noted by his urologist and thought to be a "gristle." More recently he had a small lump that came out in the left inguinal area about the size of a quarter. He was able to press on it and it disappeared. He is not having any pain. He has small lump has not recurred. He can show me one today. He's not having any other GU symptoms.   ROS: Basically negative  MEDS: Current Outpatient Prescriptions  Medication Sig Dispense Refill  . acetaminophen (TYLENOL) 325 MG tablet Take 650 mg by mouth every 6 (six) hours as needed.        Marland Kitchen esomeprazole (NEXIUM) 40 MG capsule Take 40 mg by mouth 2 (two) times daily.       . INDERAL 20 MG tablet TAKE 1 TABLET TWICE DAILY.  120 each  0  . loratadine (CLARITIN) 10 MG tablet Take 10 mg by mouth as needed.       Marland Kitchen oxyCODONE-acetaminophen (PERCOCET) 5-325 MG per tablet Ad lib.      . traZODone (DESYREL) 50 MG tablet Take 0.5-1 tablets (25-50 mg total) by mouth at bedtime as needed for sleep.  30 tablet  3      ALLERGIES:No Known Allergies    PE GENERAL:  The patient is alert, oriented, and generally healthy-appearing, NAD. Mood and affect are normal.    ABDOMEN:  Soft, flat, and nontender. No masses or organomegaly is noted. No hernias are noted. Bowel sounds are normal.Well healed surgical midline scar from [facility] repair. No inguinal hernia detectable  GU:  Normal male  EXTREMITIES:     Data Reviewed I have per hour old office records dating back to the year 2000. I've also looked over information in his electronic medical record.  Assessment Possible left inguinal hernia by history but no hernia detectable today on physical examination  Plan I told him if the bulge he was able to feel recurs or becomes more obvious to come back and let me reexamine him again. He felt or pain  he should also come back for a recheck.  I told him that for his office visit as a diagnosis I placed the diagnosis of abdominal mass because that was the closest diagnosis to his current symptoms that I could provide.

## 2011-03-01 NOTE — Patient Instructions (Signed)
If you feel the bulge again make another appointment to see me.

## 2011-03-05 DIAGNOSIS — M25569 Pain in unspecified knee: Secondary | ICD-10-CM | POA: Diagnosis not present

## 2011-03-05 DIAGNOSIS — M1710 Unilateral primary osteoarthritis, unspecified knee: Secondary | ICD-10-CM | POA: Diagnosis not present

## 2011-03-07 DIAGNOSIS — M1710 Unilateral primary osteoarthritis, unspecified knee: Secondary | ICD-10-CM | POA: Diagnosis not present

## 2011-03-07 DIAGNOSIS — M25569 Pain in unspecified knee: Secondary | ICD-10-CM | POA: Diagnosis not present

## 2011-03-11 DIAGNOSIS — M25569 Pain in unspecified knee: Secondary | ICD-10-CM | POA: Diagnosis not present

## 2011-03-11 DIAGNOSIS — M1710 Unilateral primary osteoarthritis, unspecified knee: Secondary | ICD-10-CM | POA: Diagnosis not present

## 2011-03-13 DIAGNOSIS — M1710 Unilateral primary osteoarthritis, unspecified knee: Secondary | ICD-10-CM | POA: Diagnosis not present

## 2011-03-13 DIAGNOSIS — M25569 Pain in unspecified knee: Secondary | ICD-10-CM | POA: Diagnosis not present

## 2011-03-18 DIAGNOSIS — M25569 Pain in unspecified knee: Secondary | ICD-10-CM | POA: Diagnosis not present

## 2011-03-18 DIAGNOSIS — M1710 Unilateral primary osteoarthritis, unspecified knee: Secondary | ICD-10-CM | POA: Diagnosis not present

## 2011-03-20 DIAGNOSIS — M25569 Pain in unspecified knee: Secondary | ICD-10-CM | POA: Diagnosis not present

## 2011-03-20 DIAGNOSIS — M1710 Unilateral primary osteoarthritis, unspecified knee: Secondary | ICD-10-CM | POA: Diagnosis not present

## 2011-03-25 DIAGNOSIS — M1710 Unilateral primary osteoarthritis, unspecified knee: Secondary | ICD-10-CM | POA: Diagnosis not present

## 2011-03-25 DIAGNOSIS — M25569 Pain in unspecified knee: Secondary | ICD-10-CM | POA: Diagnosis not present

## 2011-03-27 DIAGNOSIS — M1710 Unilateral primary osteoarthritis, unspecified knee: Secondary | ICD-10-CM | POA: Diagnosis not present

## 2011-03-27 DIAGNOSIS — M25569 Pain in unspecified knee: Secondary | ICD-10-CM | POA: Diagnosis not present

## 2011-04-01 DIAGNOSIS — M1710 Unilateral primary osteoarthritis, unspecified knee: Secondary | ICD-10-CM | POA: Diagnosis not present

## 2011-04-01 DIAGNOSIS — M25569 Pain in unspecified knee: Secondary | ICD-10-CM | POA: Diagnosis not present

## 2011-04-03 DIAGNOSIS — M1710 Unilateral primary osteoarthritis, unspecified knee: Secondary | ICD-10-CM | POA: Diagnosis not present

## 2011-04-03 DIAGNOSIS — M25569 Pain in unspecified knee: Secondary | ICD-10-CM | POA: Diagnosis not present

## 2011-04-04 DIAGNOSIS — M2240 Chondromalacia patellae, unspecified knee: Secondary | ICD-10-CM | POA: Diagnosis not present

## 2011-04-09 DIAGNOSIS — M25569 Pain in unspecified knee: Secondary | ICD-10-CM | POA: Diagnosis not present

## 2011-04-09 DIAGNOSIS — M1710 Unilateral primary osteoarthritis, unspecified knee: Secondary | ICD-10-CM | POA: Diagnosis not present

## 2011-04-12 DIAGNOSIS — M25569 Pain in unspecified knee: Secondary | ICD-10-CM | POA: Diagnosis not present

## 2011-04-12 DIAGNOSIS — M1710 Unilateral primary osteoarthritis, unspecified knee: Secondary | ICD-10-CM | POA: Diagnosis not present

## 2011-04-15 DIAGNOSIS — M1710 Unilateral primary osteoarthritis, unspecified knee: Secondary | ICD-10-CM | POA: Diagnosis not present

## 2011-04-15 DIAGNOSIS — M25569 Pain in unspecified knee: Secondary | ICD-10-CM | POA: Diagnosis not present

## 2011-04-17 DIAGNOSIS — M1710 Unilateral primary osteoarthritis, unspecified knee: Secondary | ICD-10-CM | POA: Diagnosis not present

## 2011-04-17 DIAGNOSIS — M25569 Pain in unspecified knee: Secondary | ICD-10-CM | POA: Diagnosis not present

## 2011-04-22 DIAGNOSIS — M25569 Pain in unspecified knee: Secondary | ICD-10-CM | POA: Diagnosis not present

## 2011-04-22 DIAGNOSIS — M1710 Unilateral primary osteoarthritis, unspecified knee: Secondary | ICD-10-CM | POA: Diagnosis not present

## 2011-04-24 DIAGNOSIS — M25569 Pain in unspecified knee: Secondary | ICD-10-CM | POA: Diagnosis not present

## 2011-04-24 DIAGNOSIS — M1710 Unilateral primary osteoarthritis, unspecified knee: Secondary | ICD-10-CM | POA: Diagnosis not present

## 2011-05-06 ENCOUNTER — Telehealth: Payer: Self-pay | Admitting: Internal Medicine

## 2011-05-06 ENCOUNTER — Encounter: Payer: Self-pay | Admitting: Family

## 2011-05-06 ENCOUNTER — Ambulatory Visit (INDEPENDENT_AMBULATORY_CARE_PROVIDER_SITE_OTHER): Payer: Medicare Other | Admitting: Family

## 2011-05-06 VITALS — BP 132/80 | Temp 98.6°F | Ht 70.0 in | Wt 176.0 lb

## 2011-05-06 DIAGNOSIS — R05 Cough: Secondary | ICD-10-CM

## 2011-05-06 DIAGNOSIS — J189 Pneumonia, unspecified organism: Secondary | ICD-10-CM | POA: Diagnosis not present

## 2011-05-06 MED ORDER — HYDROCOD POLST-CHLORPHEN POLST 10-8 MG/5ML PO LQCR: 5.0000 mL | Freq: Two times a day (BID) | ORAL | Status: DC | PRN

## 2011-05-06 MED ORDER — DOXYCYCLINE HYCLATE 100 MG PO TABS: 100.0000 mg | ORAL_TABLET | Freq: Two times a day (BID) | ORAL | Status: AC

## 2011-05-06 MED ORDER — METHYLPREDNISOLONE 4 MG PO KIT: PACK | ORAL | Status: AC

## 2011-05-06 NOTE — Telephone Encounter (Signed)
Appt scheduled with Padonda this afternoon.

## 2011-05-06 NOTE — Telephone Encounter (Signed)
Pt is experiencing Chest congestion, drainage,and temp as high as 101.2. Pt would like to be contacted. Pt was offered an appt with another provider but requested to speak to nurse first.

## 2011-05-06 NOTE — Patient Instructions (Signed)

## 2011-05-06 NOTE — Progress Notes (Signed)
Subjective:    Patient ID: Larry Mejia, male    DOB: 1940/11/14, 71 y.o.   MRN: [id number]  HPI Comments: C/o sinus and nasal drainage, constant hacking, nonproductive cough, dyspnea, and fever x three days. Took OTC mucinex, claritin, and tylenol with no relief.   Cough Associated symptoms include chills, a fever, postnasal drip, rhinorrhea, a sore throat and shortness of breath. Pertinent negatives include no chest pain or ear pain.      Review of Systems  Constitutional: Positive for fever, chills and diaphoresis. Negative for activity change, appetite change and fatigue.  HENT: Positive for congestion, sore throat, rhinorrhea, postnasal drip and sinus pressure. Negative for hearing loss, ear pain, nosebleeds, facial swelling, sneezing, mouth sores, trouble swallowing, neck pain, voice change, tinnitus and ear discharge.   Respiratory: Positive for cough and shortness of breath. Negative for apnea, chest tightness and stridor.   Cardiovascular: Negative for chest pain.   Past Medical History  Diagnosis Date  . Nephrolithiasis   . BPH (benign prostatic hypertrophy)   . Paroxysmal atrial fibrillation     One episode remotely  . Mitral regurgitation     Mild, echo, 2007  . Aortic insufficiency      mild...echo.Marland KitchenMarland Kitchen3/2007....Marland KitchenMarland KitchenAortic root...upper normal ...echo.Marland KitchenMarland Kitchen3/2007  . SVT (supraventricular tachycardia)   . Malignant melanoma of other specified sites of skin   . GERD (gastroesophageal reflux disease)   . Barrett esophagus   . HTN (hypertension)   . Colonic polyp   . RBBB (right bundle branch block)   . Tremor     Essential tremor  . Ejection fraction     55-60% EF, echo, 2007  . Chest pain     May, 2012    History   Social History  . Marital Status: Married    Spouse Name: N/A    Number of Children: N/A  . Years of Education: N/A   Occupational History  . Not on file.   Social History Main Topics  . Smoking status: Former Games developer  . Smokeless tobacco: Not  on file  . Alcohol Use: Yes  . Drug Use: No  . Sexually Active: Not on file   Other Topics Concern  . Not on file   Social History Narrative  . No narrative on file    Past Surgical History  Procedure Date  . Vasectomy   . Meniscus repair 2012    right  . Cholecystectomy 02/13/1999    LC and IOC by Dr Orpah Greek  . Nissen fundoplication circa 1981    Dr Wonda Horner    Family History  Problem Relation Age of Onset  . Cancer Father     colon  . Stroke Father     No Known Allergies  Current Outpatient Prescriptions on File Prior to Visit  Medication Sig Dispense Refill  . acetaminophen (TYLENOL) 325 MG tablet Take 650 mg by mouth every 6 (six) hours as needed.        Marland Kitchen esomeprazole (NEXIUM) 40 MG capsule Take 40 mg by mouth 2 (two) times daily.       . INDERAL 20 MG tablet TAKE 1 TABLET TWICE DAILY.  120 each  0  . loratadine (CLARITIN) 10 MG tablet Take 10 mg by mouth as needed.       Marland Kitchen oxyCODONE-acetaminophen (PERCOCET) 5-325 MG per tablet Ad lib.      . traZODone (DESYREL) 50 MG tablet Take 0.5-1 tablets (25-50 mg total) by mouth at bedtime as needed for sleep.  30  tablet  3    BP 132/80  Temp(Src) 98.6 F (37 C) (Oral)  Ht 5\' 10"  (1.778 m)  Wt 176 lb (79.833 kg)  BMI 25.25 kg/m2chart     Objective:   Physical Exam  Constitutional: He is oriented to person, place, and time. He appears well-developed and well-nourished. No distress.  HENT:  Right Ear: External ear normal.  Left Ear: External ear normal.  Nose: Nose normal.  Mouth/Throat: Oropharyngeal exudate present.  Eyes: Right eye exhibits no discharge. Left eye exhibits no discharge.  Cardiovascular: Normal rate, regular rhythm, normal heart sounds and intact distal pulses.  Exam reveals no gallop and no friction rub.   No murmur heard. Pulmonary/Chest: Effort normal and breath sounds normal. No respiratory distress. He has no wheezes. He has no rales. He exhibits no tenderness.  Neurological: He is  alert and oriented to person, place, and time.  Skin: Skin is warm and dry. He is not diaphoretic.          Assessment & Plan:  Assessment: Pneumonia Plan: Prednisone, tussinex, doxycycline, rest and increase po fluids, teaching handouts provided on diagnosis and medications, encouraged to RTC if s/s get worse and do not begin to subside within one week

## 2011-05-09 DIAGNOSIS — M2240 Chondromalacia patellae, unspecified knee: Secondary | ICD-10-CM | POA: Diagnosis not present

## 2011-06-10 ENCOUNTER — Other Ambulatory Visit: Payer: Self-pay | Admitting: Internal Medicine

## 2011-09-05 DIAGNOSIS — M2240 Chondromalacia patellae, unspecified knee: Secondary | ICD-10-CM | POA: Diagnosis not present

## 2011-11-14 DIAGNOSIS — D235 Other benign neoplasm of skin of trunk: Secondary | ICD-10-CM | POA: Diagnosis not present

## 2011-11-14 DIAGNOSIS — L570 Actinic keratosis: Secondary | ICD-10-CM | POA: Diagnosis not present

## 2011-11-14 DIAGNOSIS — Z85820 Personal history of malignant melanoma of skin: Secondary | ICD-10-CM | POA: Diagnosis not present

## 2011-12-05 DIAGNOSIS — K449 Diaphragmatic hernia without obstruction or gangrene: Secondary | ICD-10-CM | POA: Diagnosis not present

## 2011-12-05 DIAGNOSIS — Z8719 Personal history of other diseases of the digestive system: Secondary | ICD-10-CM | POA: Diagnosis not present

## 2011-12-05 DIAGNOSIS — Z9889 Other specified postprocedural states: Secondary | ICD-10-CM | POA: Diagnosis not present

## 2011-12-05 DIAGNOSIS — Z09 Encounter for follow-up examination after completed treatment for conditions other than malignant neoplasm: Secondary | ICD-10-CM | POA: Diagnosis not present

## 2011-12-05 DIAGNOSIS — K2270 Barrett's esophagus without dysplasia: Secondary | ICD-10-CM | POA: Diagnosis not present

## 2012-01-07 DIAGNOSIS — H2510 Age-related nuclear cataract, unspecified eye: Secondary | ICD-10-CM | POA: Diagnosis not present

## 2012-01-15 ENCOUNTER — Telehealth: Payer: Self-pay | Admitting: Internal Medicine

## 2012-01-15 NOTE — Telephone Encounter (Signed)
Error/kjh 

## 2012-02-05 ENCOUNTER — Other Ambulatory Visit: Payer: Self-pay | Admitting: *Deleted

## 2012-02-05 MED ORDER — PROPRANOLOL HCL 20 MG PO TABS: 20.0000 mg | ORAL_TABLET | Freq: Two times a day (BID) | ORAL | Status: DC

## 2012-03-02 ENCOUNTER — Ambulatory Visit: Payer: Medicare Other | Admitting: Internal Medicine

## 2012-03-03 ENCOUNTER — Encounter: Payer: Self-pay | Admitting: Internal Medicine

## 2012-03-03 ENCOUNTER — Ambulatory Visit (INDEPENDENT_AMBULATORY_CARE_PROVIDER_SITE_OTHER): Payer: Medicare Other | Admitting: Internal Medicine

## 2012-03-03 VITALS — BP 142/76 | HR 64 | Temp 97.8°F | Wt 169.0 lb

## 2012-03-03 DIAGNOSIS — R251 Tremor, unspecified: Secondary | ICD-10-CM

## 2012-03-03 DIAGNOSIS — Z23 Encounter for immunization: Secondary | ICD-10-CM | POA: Diagnosis not present

## 2012-03-03 DIAGNOSIS — I359 Nonrheumatic aortic valve disorder, unspecified: Secondary | ICD-10-CM | POA: Diagnosis not present

## 2012-03-03 DIAGNOSIS — K2270 Barrett's esophagus without dysplasia: Secondary | ICD-10-CM | POA: Diagnosis not present

## 2012-03-03 DIAGNOSIS — R259 Unspecified abnormal involuntary movements: Secondary | ICD-10-CM

## 2012-03-03 DIAGNOSIS — C439 Malignant melanoma of skin, unspecified: Secondary | ICD-10-CM

## 2012-03-03 DIAGNOSIS — I351 Nonrheumatic aortic (valve) insufficiency: Secondary | ICD-10-CM

## 2012-03-03 DIAGNOSIS — I10 Essential (primary) hypertension: Secondary | ICD-10-CM | POA: Diagnosis not present

## 2012-03-03 NOTE — Progress Notes (Signed)
Patient ID: Larry Mejia, male   DOB: Jul 05, 1940, 72 y.o.   MRN: [id number] Barrett's esoph-- reviewed [facility] records  htn-- tolerating meds  Melanoma- 1993. He sees derm q 6 months  Past Medical History  Diagnosis Date  . Nephrolithiasis   . BPH (benign prostatic hypertrophy)   . Paroxysmal atrial fibrillation     One episode remotely  . Mitral regurgitation     Mild, echo, 2007  . Aortic insufficiency      mild...echo.Marland KitchenMarland Kitchen3/2007....Marland KitchenMarland KitchenAortic root...upper normal ...echo.Marland KitchenMarland Kitchen3/2007  . SVT (supraventricular tachycardia)   . Malignant melanoma of other specified sites of skin   . GERD (gastroesophageal reflux disease)   . Barrett esophagus   . HTN (hypertension)   . Colonic polyp   . RBBB (right bundle branch block)   . Tremor     Essential tremor  . Ejection fraction     55-60% EF, echo, 2007  . Chest pain     May, 2012    History   Social History  . Marital Status: Married    Spouse Name: N/A    Number of Children: N/A  . Years of Education: N/A   Occupational History  . Not on file.   Social History Main Topics  . Smoking status: Former Games developer  . Smokeless tobacco: Not on file  . Alcohol Use: Yes  . Drug Use: No  . Sexually Active: Not on file   Other Topics Concern  . Not on file   Social History Narrative  . No narrative on file    Past Surgical History  Procedure Date  . Vasectomy   . Meniscus repair 2012    right  . Cholecystectomy 02/13/1999    LC and IOC by Dr Orpah Greek  . Nissen fundoplication circa 1981    Dr Wonda Horner    Family History  Problem Relation Age of Onset  . Cancer Father     colon  . Stroke Father     No Known Allergies  Current Outpatient Prescriptions on File Prior to Visit  Medication Sig Dispense Refill  . acetaminophen (TYLENOL) 325 MG tablet Take 650 mg by mouth every 6 (six) hours as needed.        Marland Kitchen esomeprazole (NEXIUM) 40 MG capsule Take 40 mg by mouth 2 (two) times daily.       Marland Kitchen loratadine (CLARITIN) 10 MG  tablet Take 10 mg by mouth as needed.       . propranolol (INDERAL) 20 MG tablet Take 1 tablet (20 mg total) by mouth 2 (two) times daily.  120 tablet  0     patient denies chest pain, shortness of breath, orthopnea. Denies lower extremity edema, abdominal pain, change in appetite, change in bowel movements. Patient denies rashes, musculoskeletal complaints. No other specific complaints in a complete review of systems.   BP 152/90  Pulse 64  Temp 97.8 F (36.6 C) (Oral)  Wt 169 lb (76.658 kg)  well-developed well-nourished male in no acute distress. HEENT exam atraumatic, normocephalic, neck supple without jugular venous distention. Chest clear to auscultation cardiac exam S1-S2 are regular. Abdominal exam overweight with bowel sounds, soft and nontender. Extremities no edema. Neurologic exam is alert with a normal gait.  Repeat bp 144/78

## 2012-03-05 NOTE — Assessment & Plan Note (Signed)
He has noted increasing tremor. I've talked to him about side effects. I've talked to him about additional medications. He will consider and call me back.

## 2012-03-05 NOTE — Assessment & Plan Note (Signed)
Has regular follow up with dermatology

## 2012-03-05 NOTE — Assessment & Plan Note (Signed)
Reviewed records from [place]. No further evaluation at this time. He will need lifelong treatment with PPI

## 2012-03-06 ENCOUNTER — Other Ambulatory Visit (INDEPENDENT_AMBULATORY_CARE_PROVIDER_SITE_OTHER): Payer: Medicare Other

## 2012-03-06 DIAGNOSIS — K2270 Barrett's esophagus without dysplasia: Secondary | ICD-10-CM

## 2012-03-06 LAB — CBC WITH DIFFERENTIAL/PLATELET
Basophils Absolute: 0.1 10*3/uL (ref 0.0–0.1)
Basophils Relative: 1.7 % (ref 0.0–3.0)
Eosinophils Absolute: 0.2 10*3/uL (ref 0.0–0.7)
Eosinophils Relative: 5.6 % — ABNORMAL HIGH (ref 0.0–5.0)
Lymphocytes Relative: 33.9 % (ref 12.0–46.0)
Lymphs Abs: 1.3 10*3/uL (ref 0.7–4.0)
Monocytes Absolute: 0.4 10*3/uL (ref 0.1–1.0)
Monocytes Relative: 11 % (ref 3.0–12.0)
Neutro Abs: 1.9 10*3/uL (ref 1.4–7.7)
Neutrophils Relative %: 47.8 % (ref 43.0–77.0)
Platelets: 162 10*3/uL (ref 150.0–400.0)
WBC: 3.9 10*3/uL — ABNORMAL LOW (ref 4.5–10.5)

## 2012-03-06 LAB — BASIC METABOLIC PANEL
Chloride: 106 mEq/L (ref 96–112)
Creatinine, Ser: 0.9 mg/dL (ref 0.4–1.5)
Potassium: 4.2 mEq/L (ref 3.5–5.1)
Sodium: 141 mEq/L (ref 135–145)

## 2012-03-06 LAB — HEPATIC FUNCTION PANEL
Albumin: 4.1 g/dL (ref 3.5–5.2)
Alkaline Phosphatase: 60 U/L (ref 39–117)
Total Protein: 6.7 g/dL (ref 6.0–8.3)

## 2012-06-02 ENCOUNTER — Other Ambulatory Visit: Payer: Self-pay | Admitting: Internal Medicine

## 2012-10-02 DIAGNOSIS — N200 Calculus of kidney: Secondary | ICD-10-CM | POA: Diagnosis not present

## 2012-10-28 DIAGNOSIS — N200 Calculus of kidney: Secondary | ICD-10-CM | POA: Diagnosis not present

## 2012-11-12 DIAGNOSIS — R3129 Other microscopic hematuria: Secondary | ICD-10-CM | POA: Diagnosis not present

## 2012-11-12 DIAGNOSIS — N200 Calculus of kidney: Secondary | ICD-10-CM | POA: Diagnosis not present

## 2012-12-08 DIAGNOSIS — H2589 Other age-related cataract: Secondary | ICD-10-CM | POA: Diagnosis not present

## 2012-12-08 DIAGNOSIS — H43819 Vitreous degeneration, unspecified eye: Secondary | ICD-10-CM | POA: Diagnosis not present

## 2013-01-07 DIAGNOSIS — Z9889 Other specified postprocedural states: Secondary | ICD-10-CM | POA: Diagnosis not present

## 2013-01-07 DIAGNOSIS — K2270 Barrett's esophagus without dysplasia: Secondary | ICD-10-CM | POA: Diagnosis not present

## 2013-01-07 DIAGNOSIS — K449 Diaphragmatic hernia without obstruction or gangrene: Secondary | ICD-10-CM | POA: Diagnosis not present

## 2013-01-25 DIAGNOSIS — L820 Inflamed seborrheic keratosis: Secondary | ICD-10-CM | POA: Diagnosis not present

## 2013-01-25 DIAGNOSIS — L570 Actinic keratosis: Secondary | ICD-10-CM | POA: Diagnosis not present

## 2013-01-25 DIAGNOSIS — Z85820 Personal history of malignant melanoma of skin: Secondary | ICD-10-CM | POA: Diagnosis not present

## 2013-02-01 ENCOUNTER — Other Ambulatory Visit: Payer: Self-pay | Admitting: Internal Medicine

## 2013-02-10 ENCOUNTER — Encounter: Payer: Self-pay | Admitting: Internal Medicine

## 2013-02-10 ENCOUNTER — Ambulatory Visit (INDEPENDENT_AMBULATORY_CARE_PROVIDER_SITE_OTHER): Payer: Medicare Other

## 2013-02-10 DIAGNOSIS — H43399 Other vitreous opacities, unspecified eye: Secondary | ICD-10-CM | POA: Diagnosis not present

## 2013-02-10 DIAGNOSIS — Z23 Encounter for immunization: Secondary | ICD-10-CM | POA: Diagnosis not present

## 2013-02-10 DIAGNOSIS — H2510 Age-related nuclear cataract, unspecified eye: Secondary | ICD-10-CM | POA: Diagnosis not present

## 2013-03-08 ENCOUNTER — Ambulatory Visit (INDEPENDENT_AMBULATORY_CARE_PROVIDER_SITE_OTHER): Payer: Medicare Other | Admitting: Internal Medicine

## 2013-03-08 ENCOUNTER — Encounter: Payer: Self-pay | Admitting: Internal Medicine

## 2013-03-08 VITALS — BP 122/80 | HR 76 | Temp 97.8°F | Ht 70.0 in | Wt 168.0 lb

## 2013-03-08 DIAGNOSIS — I10 Essential (primary) hypertension: Secondary | ICD-10-CM

## 2013-03-08 DIAGNOSIS — I359 Nonrheumatic aortic valve disorder, unspecified: Secondary | ICD-10-CM

## 2013-03-08 DIAGNOSIS — C439 Malignant melanoma of skin, unspecified: Secondary | ICD-10-CM

## 2013-03-08 DIAGNOSIS — R259 Unspecified abnormal involuntary movements: Secondary | ICD-10-CM | POA: Diagnosis not present

## 2013-03-08 DIAGNOSIS — K2270 Barrett's esophagus without dysplasia: Secondary | ICD-10-CM | POA: Diagnosis not present

## 2013-03-08 DIAGNOSIS — I351 Nonrheumatic aortic (valve) insufficiency: Secondary | ICD-10-CM

## 2013-03-08 DIAGNOSIS — R251 Tremor, unspecified: Secondary | ICD-10-CM

## 2013-03-08 LAB — CBC WITH DIFFERENTIAL/PLATELET
BASOS ABS: 0.1 10*3/uL (ref 0.0–0.1)
Basophils Relative: 1.3 % (ref 0.0–3.0)
EOS ABS: 0.2 10*3/uL (ref 0.0–0.7)
Eosinophils Relative: 5.4 % — ABNORMAL HIGH (ref 0.0–5.0)
HEMATOCRIT: 41.8 % (ref 39.0–52.0)
HEMOGLOBIN: 14 g/dL (ref 13.0–17.0)
LYMPHS ABS: 1.2 10*3/uL (ref 0.7–4.0)
LYMPHS PCT: 29.5 % (ref 12.0–46.0)
MCHC: 33.6 g/dL (ref 30.0–36.0)
MCV: 92.5 fl (ref 78.0–100.0)
Monocytes Absolute: 0.4 10*3/uL (ref 0.1–1.0)
Monocytes Relative: 10.6 % (ref 3.0–12.0)
NEUTROS ABS: 2.1 10*3/uL (ref 1.4–7.7)
Neutrophils Relative %: 53.2 % (ref 43.0–77.0)
PLATELETS: 160 10*3/uL (ref 150.0–400.0)
RBC: 4.52 Mil/uL (ref 4.22–5.81)
RDW: 12.9 % (ref 11.5–14.6)
WBC: 4 10*3/uL — ABNORMAL LOW (ref 4.5–10.5)

## 2013-03-08 LAB — BASIC METABOLIC PANEL
BUN: 17 mg/dL (ref 6–23)
CALCIUM: 9.6 mg/dL (ref 8.4–10.5)
CO2: 29 mEq/L (ref 19–32)
CREATININE: 0.9 mg/dL (ref 0.4–1.5)
Chloride: 109 mEq/L (ref 96–112)
GFR: 83.65 mL/min (ref >60.00)
Glucose, Bld: 103 mg/dL — ABNORMAL HIGH (ref 70–99)
Potassium: 5.8 mEq/L — ABNORMAL HIGH (ref 3.5–5.1)
SODIUM: 143 meq/L (ref 135–145)

## 2013-03-08 LAB — MAGNESIUM: MAGNESIUM: 1.9 mg/dL (ref 1.5–2.5)

## 2013-03-08 MED ORDER — TRAZODONE HCL 50 MG PO TABS: 25.0000 mg | ORAL_TABLET | Freq: Every evening | ORAL | Status: DC | PRN

## 2013-03-08 MED ORDER — PRIMIDONE 50 MG PO TABS: 50.0000 mg | ORAL_TABLET | Freq: Two times a day (BID) | ORAL | Status: DC

## 2013-03-08 MED ORDER — PROPRANOLOL HCL 20 MG PO TABS: 20.0000 mg | ORAL_TABLET | Freq: Two times a day (BID) | ORAL | Status: DC

## 2013-03-08 NOTE — Assessment & Plan Note (Signed)
Has regular f/u with dermatology.

## 2013-03-08 NOTE — Progress Notes (Signed)
Pre visit review using our clinic review tool, if applicable. No additional management support is needed unless otherwise documented below in the visit note. 

## 2013-03-08 NOTE — Progress Notes (Signed)
Barrett's esoph- has regular f/u with [facility]- reviewed records from 2014  Melanoma- has eval by derm regularly  htn- tolerating meds without difficulty  Grief- grandson died.  Past Medical History  Diagnosis Date  . Nephrolithiasis   . BPH (benign prostatic hypertrophy)   . Paroxysmal atrial fibrillation     One episode remotely  . Mitral regurgitation     Mild, echo, 2007  . Aortic insufficiency      mild...echo.Marland KitchenMarland Kitchen3/2007....Marland KitchenMarland KitchenAortic root...upper normal ...echo.Marland KitchenMarland Kitchen3/2007  . SVT (supraventricular tachycardia)   . Malignant melanoma of other specified sites of skin   . GERD (gastroesophageal reflux disease)   . Barrett esophagus   . HTN (hypertension)   . Colonic polyp   . RBBB (right bundle branch block)   . Tremor     Essential tremor  . Ejection fraction     55-60% EF, echo, 2007  . Chest pain     May, 2012    History   Social History  . Marital Status: Married    Spouse Name: N/A    Number of Children: N/A  . Years of Education: N/A   Occupational History  . Not on file.   Social History Main Topics  . Smoking status: Former Research scientist (life sciences)  . Smokeless tobacco: Not on file  . Alcohol Use: Yes  . Drug Use: No  . Sexual Activity: Not on file   Other Topics Concern  . Not on file   Social History Narrative  . No narrative on file    Past Surgical History  Procedure Laterality Date  . Vasectomy    . Meniscus repair  2012    right  . Cholecystectomy  02/13/1999    LC and IOC by Dr Leafy Kindle  . Nissen fundoplication  circa 1829    Dr Jeral Pinch    Family History  Problem Relation Age of Onset  . Cancer Father     colon  . Stroke Father     No Known Allergies  Current Outpatient Prescriptions on File Prior to Visit  Medication Sig Dispense Refill  . acetaminophen (TYLENOL) 325 MG tablet Take 650 mg by mouth every 6 (six) hours as needed.        Marland Kitchen esomeprazole (NEXIUM) 40 MG capsule Take 40 mg by mouth 2 (two) times daily.       Marland Kitchen loratadine (CLARITIN)  10 MG tablet Take 10 mg by mouth as needed.       . propranolol (INDERAL) 20 MG tablet TAKE 1 TABLET TWICE DAILY.  120 tablet  0   No current facility-administered medications on file prior to visit.     patient denies chest pain, shortness of breath, orthopnea. Denies lower extremity edema, abdominal pain, change in appetite, change in bowel movements. Patient denies rashes, musculoskeletal complaints. No other specific complaints in a complete review of systems. He has been evaluated for "floaters". Has some difficulty sleeping.   BP 122/80  Pulse 76  Temp(Src) 97.8 F (36.6 C) (Oral)  Ht 5\' 10"  (1.778 m)  Wt 168 lb (76.204 kg)  BMI 24.11 kg/m2   well-developed well-nourished male in no acute distress. HEENT exam atraumatic, normocephalic, neck supple without jugular venous distention. Chest clear to auscultation cardiac exam S1-S2 are regular. Abdominal exam overweight with bowel sounds, soft and nontender. Extremities no edema. Neurologic exam is alert with a normal gait.

## 2013-03-08 NOTE — Assessment & Plan Note (Signed)
He has noted tremor worsening

## 2013-03-08 NOTE — Assessment & Plan Note (Signed)
He has regular f/u with [facility] Apparently he has scheduled RF ablation- starting next month

## 2013-03-25 DIAGNOSIS — K219 Gastro-esophageal reflux disease without esophagitis: Secondary | ICD-10-CM | POA: Diagnosis not present

## 2013-03-25 DIAGNOSIS — K2270 Barrett's esophagus without dysplasia: Secondary | ICD-10-CM | POA: Diagnosis not present

## 2013-03-25 DIAGNOSIS — K449 Diaphragmatic hernia without obstruction or gangrene: Secondary | ICD-10-CM | POA: Diagnosis not present

## 2013-03-25 DIAGNOSIS — Z79899 Other long term (current) drug therapy: Secondary | ICD-10-CM | POA: Diagnosis not present

## 2013-03-25 DIAGNOSIS — G250 Essential tremor: Secondary | ICD-10-CM | POA: Diagnosis not present

## 2013-03-25 DIAGNOSIS — G252 Other specified forms of tremor: Secondary | ICD-10-CM | POA: Diagnosis not present

## 2013-03-30 ENCOUNTER — Telehealth: Payer: Self-pay | Admitting: Internal Medicine

## 2013-03-30 NOTE — Telephone Encounter (Signed)
Relevant patient education mailed to patient.  

## 2013-05-07 ENCOUNTER — Ambulatory Visit: Payer: Medicare Other | Admitting: Internal Medicine

## 2013-05-12 ENCOUNTER — Ambulatory Visit (INDEPENDENT_AMBULATORY_CARE_PROVIDER_SITE_OTHER): Payer: Medicare Other | Admitting: Internal Medicine

## 2013-05-12 ENCOUNTER — Encounter: Payer: Self-pay | Admitting: Internal Medicine

## 2013-05-12 VITALS — BP 132/84 | HR 56 | Temp 97.7°F | Ht 70.0 in | Wt 162.0 lb

## 2013-05-12 DIAGNOSIS — C439 Malignant melanoma of skin, unspecified: Secondary | ICD-10-CM

## 2013-05-12 DIAGNOSIS — Z23 Encounter for immunization: Secondary | ICD-10-CM

## 2013-05-12 DIAGNOSIS — I10 Essential (primary) hypertension: Secondary | ICD-10-CM | POA: Diagnosis not present

## 2013-05-12 DIAGNOSIS — Z2911 Encounter for prophylactic immunotherapy for respiratory syncytial virus (RSV): Secondary | ICD-10-CM | POA: Diagnosis not present

## 2013-05-12 DIAGNOSIS — K2270 Barrett's esophagus without dysplasia: Secondary | ICD-10-CM | POA: Diagnosis not present

## 2013-05-12 DIAGNOSIS — R259 Unspecified abnormal involuntary movements: Secondary | ICD-10-CM

## 2013-05-12 DIAGNOSIS — R251 Tremor, unspecified: Secondary | ICD-10-CM

## 2013-05-12 NOTE — Progress Notes (Signed)
Barrett's with significant dysplasia, followed at [facility]-- reviewed records from January  htn- tolerating meds  Tremor- unable to tolerate primidone  Past Medical History  Diagnosis Date  . Nephrolithiasis   . BPH (benign prostatic hypertrophy)   . Paroxysmal atrial fibrillation     One episode remotely  . Mitral regurgitation     Mild, echo, 2007  . Aortic insufficiency      mild...echo.Marland KitchenMarland Kitchen3/2007....Marland KitchenMarland KitchenAortic root...upper normal ...echo.Marland KitchenMarland Kitchen3/2007  . SVT (supraventricular tachycardia)   . Malignant melanoma of other specified sites of skin   . GERD (gastroesophageal reflux disease)   . Barrett esophagus   . HTN (hypertension)   . Colonic polyp   . RBBB (right bundle branch block)   . Tremor     Essential tremor  . Ejection fraction     55-60% EF, echo, 2007  . Chest pain     May, 2012    History   Social History  . Marital Status: Married    Spouse Name: N/A    Number of Children: N/A  . Years of Education: N/A   Occupational History  . Not on file.   Social History Main Topics  . Smoking status: Former Research scientist (life sciences)  . Smokeless tobacco: Not on file  . Alcohol Use: Yes  . Drug Use: No  . Sexual Activity: Not on file   Other Topics Concern  . Not on file   Social History Narrative  . No narrative on file    Past Surgical History  Procedure Laterality Date  . Vasectomy    . Meniscus repair  2012    right  . Cholecystectomy  02/13/1999    LC and IOC by Dr Leafy Kindle  . Nissen fundoplication  circa 8416    Dr Jeral Pinch    Family History  Problem Relation Age of Onset  . Cancer Father     colon  . Stroke Father     No Known Allergies  Current Outpatient Prescriptions on File Prior to Visit  Medication Sig Dispense Refill  . acetaminophen (TYLENOL) 325 MG tablet Take 650 mg by mouth every 6 (six) hours as needed.        Marland Kitchen esomeprazole (NEXIUM) 40 MG capsule Take 40 mg by mouth 2 (two) times daily.       Marland Kitchen loratadine (CLARITIN) 10 MG tablet Take 10 mg by  mouth as needed.       . propranolol (INDERAL) 20 MG tablet Take 1 tablet (20 mg total) by mouth 2 (two) times daily.  180 tablet  3  . traZODone (DESYREL) 50 MG tablet Take 0.5-1 tablets (25-50 mg total) by mouth at bedtime as needed for sleep.  30 tablet  3   No current facility-administered medications on file prior to visit.     patient denies chest pain, shortness of breath, orthopnea. Denies lower extremity edema, abdominal pain, change in appetite, change in bowel movements. Patient denies rashes, musculoskeletal complaints. No other specific complaints in a complete review of systems.   BP 132/84  Pulse 56  Temp(Src) 97.7 F (36.5 C) (Oral)  Ht 5\' 10"  (1.778 m)  Wt 162 lb (73.483 kg)  BMI 23.24 kg/m2  well-developed well-nourished male in no acute distress. HEENT exam atraumatic, normocephalic, neck supple without jugular venous distention. Chest clear to auscultation cardiac exam S1-S2 are regular. Abdominal exam overweight with bowel sounds, soft and nontender. Extremities no edema. Neurologic exam is alert with a normal gait.   HTN (hypertension) Welll controlled BP: 132/84 mmHg  Continue same meds  BARRETTS ESOPHAGUS Clinically doing well Has regular f/u with [place] Has regular f/u with dermatology

## 2013-05-12 NOTE — Progress Notes (Signed)
Pre visit review using our clinic review tool, if applicable. No additional management support is needed unless otherwise documented below in the visit note. 

## 2013-05-13 ENCOUNTER — Telehealth: Payer: Self-pay | Admitting: Internal Medicine

## 2013-05-13 NOTE — Telephone Encounter (Signed)
Relevant patient education mailed to patient.  

## 2013-05-17 NOTE — Assessment & Plan Note (Signed)
Clinically doing well Has regular f/u with [facility]

## 2013-05-17 NOTE — Assessment & Plan Note (Signed)
Welll controlled BP: 132/84 mmHg  Continue same meds

## 2013-05-17 NOTE — Assessment & Plan Note (Signed)
Has regular f/u with dermatology.

## 2013-06-03 ENCOUNTER — Encounter: Payer: Self-pay | Admitting: Internal Medicine

## 2013-06-22 ENCOUNTER — Ambulatory Visit (INDEPENDENT_AMBULATORY_CARE_PROVIDER_SITE_OTHER): Payer: Medicare Other | Admitting: Internal Medicine

## 2013-06-22 VITALS — BP 120/64 | HR 83 | Temp 98.0°F | Resp 18 | Ht 67.5 in | Wt 160.6 lb

## 2013-06-22 DIAGNOSIS — R197 Diarrhea, unspecified: Secondary | ICD-10-CM | POA: Diagnosis not present

## 2013-06-22 DIAGNOSIS — R109 Unspecified abdominal pain: Secondary | ICD-10-CM | POA: Diagnosis not present

## 2013-06-22 LAB — COMPREHENSIVE METABOLIC PANEL
ALK PHOS: 73 U/L (ref 39–117)
ALT: 17 U/L (ref 0–53)
AST: 27 U/L (ref 0–37)
Albumin: 4.6 g/dL (ref 3.5–5.2)
BILIRUBIN TOTAL: 0.7 mg/dL (ref 0.2–1.2)
BUN: 21 mg/dL (ref 6–23)
CO2: 26 meq/L (ref 19–32)
Calcium: 9.3 mg/dL (ref 8.4–10.5)
Chloride: 103 mEq/L (ref 96–112)
Creat: 0.84 mg/dL (ref 0.50–1.35)
Glucose, Bld: 101 mg/dL — ABNORMAL HIGH (ref 70–99)
Potassium: 4.3 mEq/L (ref 3.5–5.3)
SODIUM: 138 meq/L (ref 135–145)
TOTAL PROTEIN: 7.2 g/dL (ref 6.0–8.3)

## 2013-06-22 LAB — POCT CBC
GRANULOCYTE PERCENT: 91.9 % — AB (ref 37–80)
HCT, POC: 49.1 % (ref 43.5–53.7)
HEMOGLOBIN: 15.9 g/dL (ref 14.1–18.1)
Lymph, poc: 0.4 — AB (ref 0.6–3.4)
MCH, POC: 31.1 pg (ref 27–31.2)
MCHC: 32.4 g/dL (ref 31.8–35.4)
MCV: 96 fL (ref 80–97)
MID (cbc): 0.2 (ref 0–0.9)
MPV: 9.4 fL (ref 0–99.8)
POC Granulocyte: 7.6 — AB (ref 2–6.9)
POC LYMPH PERCENT: 5.2 %L — AB (ref 10–50)
POC MID %: 2.9 %M (ref 0–12)
Platelet Count, POC: 200 10*3/uL (ref 142–424)
RBC: 5.11 M/uL (ref 4.69–6.13)
RDW, POC: 13.4 %
WBC: 8.3 10*3/uL (ref 4.6–10.2)

## 2013-06-22 MED ORDER — DIPHENOXYLATE-ATROPINE 2.5-0.025 MG PO TABS: 1.0000 | ORAL_TABLET | Freq: Four times a day (QID) | ORAL | Status: DC | PRN

## 2013-06-22 MED ORDER — DICYCLOMINE HCL 20 MG PO TABS: 20.0000 mg | ORAL_TABLET | Freq: Three times a day (TID) | ORAL | Status: DC

## 2013-06-22 NOTE — Progress Notes (Signed)
Subjective:     Patient ID: Larry Mejia, male   DOB: 18-Aug-1940, 73 y.o.   MRN: [id number]  HPI 73 YO caucasian male presents to [facility] with 1 day of nausea, non-bloody diarrhea, and dry heaves. The symptoms started last evening after dinner and have not subsided. He endorses diarrhea every hour consistently without anything that has made it specifically worse. He states he ate a bowl of cereal for breakfast which he kept down, and a bowl of chicken noodle soup for lunch which he kept down. The dry heave were initially alleviated yesterday with zofran for a little while but came back late last evening and this morning. He did not eat anything unusual yesterday and conducted his ADL's without an issue.   Review of Systems  Constitutional: Positive for fever, chills, diaphoresis, activity change, appetite change and fatigue.  HENT: Negative for ear pain, postnasal drip, rhinorrhea, sinus pressure, sneezing and sore throat.   Eyes: Negative for pain and redness.  Respiratory: Negative for cough, chest tightness, shortness of breath and wheezing.   Cardiovascular: Negative for chest pain, palpitations and leg swelling.  Gastrointestinal: Positive for nausea, abdominal pain and diarrhea. Negative for constipation, blood in stool and abdominal distention.       Dry heaves with no vomitus  Endocrine: Negative for polyuria.  Genitourinary: Negative for frequency, hematuria, flank pain, difficulty urinating and penile pain.       Tender in both testicles  Musculoskeletal: Negative for joint swelling and myalgias.  Skin: Negative for color change and rash.  Neurological: Positive for dizziness, weakness and light-headedness. Negative for syncope and headaches.   Current Outpatient Prescriptions on File Prior to Visit  Medication Sig Dispense Refill  . acetaminophen (TYLENOL) 325 MG tablet Take 650 mg by mouth every 6 (six) hours as needed.        Marland Kitchen esomeprazole (NEXIUM) 40 MG capsule Take 40 mg by  mouth 2 (two) times daily.       Marland Kitchen loratadine (CLARITIN) 10 MG tablet Take 10 mg by mouth as needed.       . propranolol (INDERAL) 20 MG tablet Take 1 tablet (20 mg total) by mouth 2 (two) times daily.  180 tablet  3  . traZODone (DESYREL) 50 MG tablet Take 0.5-1 tablets (25-50 mg total) by mouth at bedtime as needed for sleep.  30 tablet  3   No current facility-administered medications on file prior to visit.   Past Medical History  Diagnosis Date  . Nephrolithiasis   . BPH (benign prostatic hypertrophy)   . Paroxysmal atrial fibrillation     One episode remotely  . Mitral regurgitation     Mild, echo, 2007  . Aortic insufficiency      mild...echo.Marland KitchenMarland Kitchen3/2007....Marland KitchenMarland KitchenAortic root...upper normal ...echo.Marland KitchenMarland Kitchen3/2007  . SVT (supraventricular tachycardia)   . Malignant melanoma of other specified sites of skin   . GERD (gastroesophageal reflux disease)   . Barrett esophagus   . HTN (hypertension)   . Colonic polyp   . RBBB (right bundle branch block)   . Tremor     Essential tremor  . Ejection fraction     55-60% EF, echo, 2007  . Chest pain     May, 2012        Objective:   Physical Exam  Constitutional: He is oriented to person, place, and time. He appears well-developed and well-nourished. No distress.  HENT:  Head: Normocephalic and atraumatic.  Right Ear: External ear normal.  Left Ear: External ear normal.  Mouth/Throat: No oropharyngeal exudate.  Eyes: Conjunctivae are normal. Pupils are equal, round, and reactive to light.  Cardiovascular: Normal rate, regular rhythm, normal heart sounds and intact distal pulses.  Exam reveals no gallop and no friction rub.   No murmur heard. Pulmonary/Chest: Effort normal and breath sounds normal. No respiratory distress. He has no wheezes. He has no rales.  Abdominal: Soft. Bowel sounds are normal. He exhibits no distension. There is tenderness. There is no rebound and no guarding.  LLQ tenderness, - murphy's, - rovsing's  Genitourinary:  Penis normal. No penile tenderness.  No tenderness in testicles  Musculoskeletal: Normal range of motion. He exhibits no edema.  Neurological: He is alert and oriented to person, place, and time. No cranial nerve deficit.  Skin: Skin is warm and dry. No rash noted. He is not diaphoretic. No erythema.  BP 120/64  Pulse 83  Temp(Src) 98 F (36.7 C) (Oral)  Resp 18  Ht 5' 7.5" (1.715 m)  Wt 160 lb 9.6 oz (72.848 kg)  BMI 24.77 kg/m2  SpO2 95%  CBC wnl     Assessment:     Diarrhea w/ abd cramping pain due to foodborne or viral infection - Plan: Stool culture, Comprehensive metabolic panel,   Meds ordered this encounter  Medications  . diphenoxylate-atropine (LOMOTIL) 2.5-0.025 MG per tablet    Sig: Take 1-2 tablets by mouth 4 (four) times daily as needed for diarrhea or loose stools.    Dispense:  30 tablet    Refill:  0  . dicyclomine (BENTYL) 20 MG tablet    Sig: Take 1 tablet (20 mg total) by mouth 4 (four) times daily -  before meals and at bedtime.    Dispense:  12 tablet    Refill:  0         Plan:above     I have completed the patient encounter in its entirety as documented by [facility] Penson, with editing by me where necessary. Henrik P. Laney Pastor, M.D.

## 2013-06-24 DIAGNOSIS — K2970 Gastritis, unspecified, without bleeding: Secondary | ICD-10-CM | POA: Diagnosis not present

## 2013-06-24 DIAGNOSIS — K2990 Gastroduodenitis, unspecified, without bleeding: Secondary | ICD-10-CM | POA: Diagnosis not present

## 2013-06-24 DIAGNOSIS — Z79899 Other long term (current) drug therapy: Secondary | ICD-10-CM | POA: Diagnosis not present

## 2013-06-24 DIAGNOSIS — K2270 Barrett's esophagus without dysplasia: Secondary | ICD-10-CM | POA: Diagnosis not present

## 2013-06-24 DIAGNOSIS — K449 Diaphragmatic hernia without obstruction or gangrene: Secondary | ICD-10-CM | POA: Diagnosis not present

## 2013-06-24 DIAGNOSIS — Z09 Encounter for follow-up examination after completed treatment for conditions other than malignant neoplasm: Secondary | ICD-10-CM | POA: Diagnosis not present

## 2013-06-26 LAB — STOOL CULTURE

## 2013-08-25 HISTORY — PX: OTHER SURGICAL HISTORY: SHX169

## 2013-09-23 DIAGNOSIS — K228 Other specified diseases of esophagus: Secondary | ICD-10-CM | POA: Diagnosis not present

## 2013-09-23 DIAGNOSIS — K2289 Other specified disease of esophagus: Secondary | ICD-10-CM | POA: Diagnosis not present

## 2013-09-23 DIAGNOSIS — K2270 Barrett's esophagus without dysplasia: Secondary | ICD-10-CM | POA: Diagnosis not present

## 2013-11-11 ENCOUNTER — Encounter: Payer: Self-pay | Admitting: Internal Medicine

## 2013-11-12 ENCOUNTER — Ambulatory Visit: Payer: Medicare Other | Admitting: Internal Medicine

## 2013-12-16 DIAGNOSIS — K449 Diaphragmatic hernia without obstruction or gangrene: Secondary | ICD-10-CM | POA: Diagnosis not present

## 2013-12-16 DIAGNOSIS — Z9889 Other specified postprocedural states: Secondary | ICD-10-CM | POA: Diagnosis not present

## 2013-12-16 DIAGNOSIS — K2270 Barrett's esophagus without dysplasia: Secondary | ICD-10-CM | POA: Diagnosis not present

## 2013-12-16 DIAGNOSIS — Z79899 Other long term (current) drug therapy: Secondary | ICD-10-CM | POA: Diagnosis not present

## 2013-12-16 DIAGNOSIS — Z8719 Personal history of other diseases of the digestive system: Secondary | ICD-10-CM | POA: Diagnosis not present

## 2013-12-16 DIAGNOSIS — Z09 Encounter for follow-up examination after completed treatment for conditions other than malignant neoplasm: Secondary | ICD-10-CM | POA: Diagnosis not present

## 2013-12-23 ENCOUNTER — Ambulatory Visit (AMBULATORY_SURGERY_CENTER): Payer: Self-pay | Admitting: *Deleted

## 2013-12-23 VITALS — Ht 70.0 in | Wt 165.0 lb

## 2013-12-23 DIAGNOSIS — M7051 Other bursitis of knee, right knee: Secondary | ICD-10-CM | POA: Diagnosis not present

## 2013-12-23 DIAGNOSIS — Z86010 Personal history of colonic polyps: Secondary | ICD-10-CM

## 2013-12-23 MED ORDER — MOVIPREP 100 G PO SOLR: ORAL | Status: DC

## 2013-12-23 NOTE — Progress Notes (Signed)
No allergies to eggs or soy. No problems with anesthesia.  Pt given Emmi instructions for colonoscopy  No oxygen use  No diet drug use  

## 2013-12-27 ENCOUNTER — Encounter: Payer: Self-pay | Admitting: Internal Medicine

## 2013-12-30 ENCOUNTER — Encounter: Payer: Self-pay | Admitting: Internal Medicine

## 2013-12-30 ENCOUNTER — Ambulatory Visit (AMBULATORY_SURGERY_CENTER): Payer: Medicare Other | Admitting: Internal Medicine

## 2013-12-30 VITALS — BP 140/83 | HR 50 | Temp 97.6°F | Resp 16 | Ht 70.0 in | Wt 165.0 lb

## 2013-12-30 DIAGNOSIS — I4510 Unspecified right bundle-branch block: Secondary | ICD-10-CM | POA: Diagnosis not present

## 2013-12-30 DIAGNOSIS — I471 Supraventricular tachycardia: Secondary | ICD-10-CM | POA: Diagnosis not present

## 2013-12-30 DIAGNOSIS — I10 Essential (primary) hypertension: Secondary | ICD-10-CM | POA: Diagnosis not present

## 2013-12-30 DIAGNOSIS — Z86010 Personal history of colonic polyps: Secondary | ICD-10-CM

## 2013-12-30 DIAGNOSIS — I4891 Unspecified atrial fibrillation: Secondary | ICD-10-CM | POA: Diagnosis not present

## 2013-12-30 MED ORDER — SODIUM CHLORIDE 0.9 % IV SOLN: 500.0000 mL | INTRAVENOUS | Status: DC

## 2013-12-30 NOTE — Patient Instructions (Signed)
YOU HAD AN ENDOSCOPIC PROCEDURE TODAY AT THE [facility]: Refer to the procedure report that was given to you for any specific questions about what was found during the examination.  If the procedure report does not answer your questions, please call your gastroenterologist to clarify.  If you requested that your care partner not be given the details of your procedure findings, then the procedure report has been included in a sealed envelope for you to review at your convenience later.  YOU SHOULD EXPECT: Some feelings of bloating in the abdomen. Passage of more gas than usual.  Walking can help get rid of the air that was put into your GI tract during the procedure and reduce the bloating. If you had a lower endoscopy (such as a colonoscopy or flexible sigmoidoscopy) you may notice spotting of blood in your stool or on the toilet paper. If you underwent a bowel prep for your procedure, then you may not have a normal bowel movement for a few days.  DIET: Your first meal following the procedure should be a light meal and then it is ok to progress to your normal diet.  A half-sandwich or bowl of soup is an example of a good first meal.  Heavy or fried foods are harder to digest and may make you feel nauseous or bloated.  Likewise meals heavy in dairy and vegetables can cause extra gas to form and this can also increase the bloating.  Drink plenty of fluids but you should avoid alcoholic beverages for 24 hours.  ACTIVITY: Your care partner should take you home directly after the procedure.  You should plan to take it easy, moving slowly for the rest of the day.  You can resume normal activity the day after the procedure however you should NOT DRIVE or use heavy machinery for 24 hours (because of the sedation medicines used during the test).    SYMPTOMS TO REPORT IMMEDIATELY: A gastroenterologist can be reached at any hour.  During normal business hours, 8:30 AM to 5:00 PM Monday through Friday,  call [phone].  After hours and on weekends, please call the GI answering service at [phone] who will take a message and have the physician on call contact you.   Following lower endoscopy (colonoscopy or flexible sigmoidoscopy):  Excessive amounts of blood in the stool  Significant tenderness or worsening of abdominal pains  Swelling of the abdomen that is new, acute  Fever of 100F or higher    FOLLOW UP: If any biopsies were taken you will be contacted by phone or by letter within the next 1-3 weeks.  Call your gastroenterologist if you have not heard about the biopsies in 3 weeks.  Our staff will call the home number listed on your records the next business day following your procedure to check on you and address any questions or concerns that you may have at that time regarding the information given to you following your procedure. This is a courtesy call and so if there is no answer at the home number and we have not heard from you through the emergency physician on call, we will assume that you have returned to your regular daily activities without incident.  SIGNATURES/CONFIDENTIALITY: You and/or your care partner have signed paperwork which will be entered into your electronic medical record.  These signatures attest to the fact that that the information above on your After Visit Summary has been reviewed and is understood.  Full responsibility of the confidentiality   of this discharge information lies with you and/or your care-partner.     

## 2013-12-30 NOTE — Progress Notes (Signed)
Report to PACU, RN, vss, BBS= Clear.  

## 2013-12-30 NOTE — Op Note (Signed)
[facility]  [address] [place], [id number]   COLONOSCOPY PROCEDURE REPORT  PATIENT: Larry Mejia, Larry Mejia  MR#: [id number] BIRTHDATE: 1941/01/06 , 73  yrs. old GENDER: male ENDOSCOPIST: Eustace Quail, MD REFERRED YY:[id number] Program Recall PROCEDURE DATE:  12/30/2013 PROCEDURE:   Colonoscopy, surveillance First Screening Colonoscopy - Avg.  risk and is 50 yrs.  old or older - No.  Prior Negative Screening - Now for repeat screening. N/A  History of Adenoma - Now for follow-up colonoscopy & has been > or = to 3 yrs.  Yes hx of adenoma.  Has been 3 or more years since last colonoscopy.  Polyps Removed Today? No.  Recommend repeat exam, <10 yrs? No. ASA CLASS:   Class II INDICATIONS:surveillance colonoscopy based on a history of adenomatous colonic polyp(s). Prior examinations 1988, 1992, 1994, 2000, 2004(negative), and 2010 (diminutive adenoma). MEDICATIONS: Monitored anesthesia care and Propofol 200 mg IV  DESCRIPTION OF PROCEDURE:   After the risks benefits and alternatives of the procedure were thoroughly explained, informed consent was obtained.  The digital rectal exam revealed no abnormalities of the rectum.   The [id number]  endoscope was introduced through the anus and advanced to the cecum, which was identified by both the appendix and ileocecal valve. No adverse events experienced.   The quality of the prep was excellent, using MoviPrep  The instrument was then slowly withdrawn as the colon was fully examined.      COLON FINDINGS: There was mild diverticulosis noted in the sigmoid colon.   The examination was otherwise normal. No polyps seen. Retroflexed views revealed internal hemorrhoids. The time to cecum=4 minutes 08 seconds.  Withdrawal time=8 minutes 41 seconds. The scope was withdrawn and the procedure completed.  COMPLICATIONS: There were no immediate complications.  ENDOSCOPIC IMPRESSION: 1.   Mild diverticulosis was noted  in the sigmoid colon 2.   The examination was otherwise normal  RECOMMENDATIONS: 1. Return to the care of your primary provider.  GI follow up as needed  eSigned:  Eustace Quail, MD 12/30/2013 2:46 PM   cc: The Patient   ; Garret Reddish , Jerilynn Mages.D.

## 2013-12-31 ENCOUNTER — Telehealth: Payer: Self-pay

## 2013-12-31 NOTE — Telephone Encounter (Signed)
  Follow up Call-  Call back number 12/30/2013  Post procedure Call Back phone  # [phone]  Permission to leave phone message Yes     Patient questions:  Do you have a fever, pain , or abdominal swelling? No. Pain Score  0 *  Have you tolerated food without any problems? Yes.    Have you been able to return to your normal activities? Yes.    Do you have any questions about your discharge instructions:    Diet   No. Medications  No. Follow up visit  No.  Do you have questions or concerns about your Care? No.  Actions: * If pain score is 4 or above: No action needed, pain <4.

## 2014-02-09 ENCOUNTER — Ambulatory Visit (INDEPENDENT_AMBULATORY_CARE_PROVIDER_SITE_OTHER): Payer: Medicare Other | Admitting: Family Medicine

## 2014-02-09 ENCOUNTER — Encounter: Payer: Self-pay | Admitting: Family Medicine

## 2014-02-09 ENCOUNTER — Ambulatory Visit (INDEPENDENT_AMBULATORY_CARE_PROVIDER_SITE_OTHER): Payer: Medicare Other

## 2014-02-09 ENCOUNTER — Other Ambulatory Visit: Payer: Self-pay | Admitting: Dermatology

## 2014-02-09 DIAGNOSIS — L820 Inflamed seborrheic keratosis: Secondary | ICD-10-CM | POA: Diagnosis not present

## 2014-02-09 DIAGNOSIS — R251 Tremor, unspecified: Secondary | ICD-10-CM

## 2014-02-09 DIAGNOSIS — Z23 Encounter for immunization: Secondary | ICD-10-CM

## 2014-02-09 DIAGNOSIS — C44519 Basal cell carcinoma of skin of other part of trunk: Secondary | ICD-10-CM | POA: Diagnosis not present

## 2014-02-09 DIAGNOSIS — I10 Essential (primary) hypertension: Secondary | ICD-10-CM | POA: Diagnosis not present

## 2014-02-09 DIAGNOSIS — G4700 Insomnia, unspecified: Secondary | ICD-10-CM | POA: Insufficient documentation

## 2014-02-09 DIAGNOSIS — D225 Melanocytic nevi of trunk: Secondary | ICD-10-CM | POA: Diagnosis not present

## 2014-02-09 DIAGNOSIS — I480 Paroxysmal atrial fibrillation: Secondary | ICD-10-CM

## 2014-02-09 DIAGNOSIS — L570 Actinic keratosis: Secondary | ICD-10-CM | POA: Diagnosis not present

## 2014-02-09 DIAGNOSIS — L821 Other seborrheic keratosis: Secondary | ICD-10-CM | POA: Diagnosis not present

## 2014-02-09 DIAGNOSIS — C44319 Basal cell carcinoma of skin of other parts of face: Secondary | ICD-10-CM | POA: Diagnosis not present

## 2014-02-09 DIAGNOSIS — L814 Other melanin hyperpigmentation: Secondary | ICD-10-CM | POA: Diagnosis not present

## 2014-02-09 DIAGNOSIS — J309 Allergic rhinitis, unspecified: Secondary | ICD-10-CM | POA: Insufficient documentation

## 2014-02-09 NOTE — Assessment & Plan Note (Signed)
Increase propranolol to 20mg  AM and stay at 10mg  pm

## 2014-02-09 NOTE — Assessment & Plan Note (Signed)
ASA use limited due to barrett's esophagus. I am not sure that other anticoagulants would be limited. I suggested patient return to discuss options with cardiology. Patient would prefer to stay off of anything. I warned him of risk of stroke and he states he would certainly start one if ever had stroke or TIA but for now, he would like to remain off of any medication and declines cardiology follow up for discussion. Continue propranolol alone for rate control. NSR today.

## 2014-02-09 NOTE — Patient Instructions (Addendum)
Prevnar today (final pneumonia booster for now)   Try a full tab of propranolol in the AM and see how this does for your tremor, continue 1/2 tab at night. This may help blood pressure some too. If you tolerate this, call me and I will send in new prescription. If you are slowed down too much, ok to continue at current 1/2 pill twice a day.   Copy of magnesium level given to you.   Sorry I don't have a good solution for your sleep problem.   See me in 6 months.   We discussed risk of stroke off of blood thinner with potentially intermittent atrial fibrillation (normal rhythm today)

## 2014-02-09 NOTE — Progress Notes (Signed)
Garret Reddish, MD Phone: [phone]  Subjective:  Patient presents today to establish care with me as their new primary care provider. Patient was formerly a patient of Dr. Leanne Chang. Chief complaint-noted.   Hypertension-mild poor control  Essential tremor-mildly worsening Essential tremor mildly worsening. Gets worse when he gets tired. Handwriting gets worse and worse. Atenolol for years for some ? PVCs. Better with alcohol.  BP Readings from Last 3 Encounters:  02/09/14 140/96  12/30/13 140/83  06/22/13 120/64  Home BP monitoring-no Compliant with medications-yes without side effects ROS-Denies any CP, HA, SOB, blurry vision, LE edema. No tremor at rest  Paroxysmal atrial fibrillation- in NSR currently Rate control with propranolol when in a fib. Aspirin contraindicated due to barrett's. Has not seen cardiology in several years.  ROS- no palpitaitons or chest pain. Also mentionsTrouble getting a good night's rest for years. Goes to sleep and wakes up after 1 hour. Tried trazodone but was groggy.   The following were reviewed and entered/updated in epic: Past Medical History  Diagnosis Date  . Nephrolithiasis   . BPH (benign prostatic hypertrophy)   . Paroxysmal atrial fibrillation     One episode remotely  . Mitral regurgitation     Mild, echo, 2007  . Aortic insufficiency      mild...echo.Marland KitchenMarland Kitchen3/2007....Marland KitchenMarland KitchenAortic root...upper normal ...echo.Marland KitchenMarland Kitchen3/2007  . SVT (supraventricular tachycardia)   . Malignant melanoma of other specified sites of skin   . GERD (gastroesophageal reflux disease)   . Barrett esophagus   . HTN (hypertension)   . Colonic polyp   . RBBB (right bundle branch block)   . Tremor     Essential tremor  . Ejection fraction     55-60% EF, echo, 2007  . Chest pain     May, 2012  . History of colonic polyps 10/13/2006    No polyps 2015 age 89-no further colonscopies     Patient Active Problem List   Diagnosis Date Noted  . Paroxysmal atrial fibrillation     Priority: High  . SVT (supraventricular tachycardia)     Priority: Medium  . HTN (hypertension)     Priority: Medium  . Tremor     Priority: Medium  . Melanoma of skin 05/31/2008    Priority: Medium  . GERD 07/30/2007    Priority: Medium  . BARRETTS ESOPHAGUS 07/30/2007    Priority: Medium  . Allergic rhinitis 02/09/2014    Priority: Low  . Insomnia 02/09/2014    Priority: Low  . Mitral regurgitation     Priority: Low  . Aortic insufficiency     Priority: Low  . RBBB (right bundle branch block)     Priority: Low  . NEPHROLITHIASIS, HX OF 05/31/2008    Priority: Low  . BPH (benign prostatic hyperplasia) 05/31/2008    Priority: Low   Past Surgical History  Procedure Laterality Date  . Vasectomy  1981  . Meniscus repair Right 2012  . Cholecystectomy  02/13/1999    LC and IOC by Dr Leafy Kindle  . Nissen fundoplication  circa 5573    Dr Jeral Pinch  . Kidney stone surgery  1981  . Mohs surgery  2011    forehead  . Melanoma removal Left 1992    shoulder  . Tonsillectomy  1966  . Esophageal ablation  08/2013    at least 6 times    Family History  Problem Relation Age of Onset  . Stroke Father 54  . Colon cancer Father 34    Medications- reviewed and updated  Current Outpatient Prescriptions  Medication Sig Dispense Refill  . esomeprazole (NEXIUM) 40 MG capsule Take 40 mg by mouth 2 (two) times daily.     Marland Kitchen loratadine (CLARITIN) 10 MG tablet Take 10 mg by mouth as needed.     . propranolol (INDERAL) 20 MG tablet Take 10 mg by mouth 2 (two) times daily.    Marland Kitchen acetaminophen (TYLENOL) 325 MG tablet Take 650 mg by mouth every 6 (six) hours as needed.      . sucralfate (CARAFATE) 1 GM/10ML suspension Take by mouth.     No current facility-administered medications for this visit.    Allergies-reviewed and updated Allergies  Allergen Reactions  . Iohexol     Contrast dye causes flushing and tingling    History   Social History  . Marital Status: Married     Spouse Name: N/A    Number of Children: N/A  . Years of Education: N/A   Social History Main Topics  . Smoking status: Former Smoker -- 0.25 packs/day for 6 years    Types: Cigarettes    Quit date: 02/25/1973  . Smokeless tobacco: Never Used  . Alcohol Use: 4.2 oz/week    7 Glasses of wine per week  . Drug Use: No  . Sexual Activity: None   Other Topics Concern  . None   Social History Narrative   Married 1963. 2 sons-1 with barrett's. Lost 1 grandkid at age 38, another 68 living in 21.    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       [place]ies[place]    ROS--See HPI   Objective: BP 140/96 mmHg  Pulse 63  Temp(Src) 98 F (36.7 C)  Wt 167 lb (75.751 kg) Gen: NAD, resting comfortably in chair HEENT: Mucous membranes are moist.  CV: RRR no murmurs rubs or gallops Lungs: CTAB no crackles, wheeze, rhonchi Abdomen: soft/nontender/nondistended/normal bowel sounds.  Ext: no edema Skin: warm, dry, no rash Neuro: grossly normal, moves all extremities, PERRLA   Assessment/Plan:  HTN (hypertension) Mild poor control DBP (ok with tolerating very mild SBP poor control) and tremor with mild poor control as well. Increase propranolol to 20mg  in day and 10mg  at night to see if patient tolerates (did not previously) Bp recheck next visit and discussed if cannot reach goal, would need additional medication for diastolic BP.   Tremor Increase propranolol to 20mg  AM and stay at 10mg  pm  Paroxysmal atrial fibrillation ASA use limited due to barrett's esophagus. I am not sure that other anticoagulants would be limited. I suggested patient return to discuss options with cardiology. Patient would prefer to stay off of anything. I warned him of risk of stroke and he states he would certainly start one if ever had stroke or TIA but for now, he would like to remain off of any medication and  declines cardiology follow up for discussion. Continue propranolol alone for rate control. NSR today.    Return precautions advised. 6 month follow up planned.   Orders Placed This Encounter  Procedures  . Pneumococcal conjugate vaccine 13-valent

## 2014-02-09 NOTE — Assessment & Plan Note (Signed)
Mild poor control DBP (ok with tolerating very mild SBP poor control) and tremor with mild poor control as well. Increase propranolol to 20mg  in day and 10mg  at night to see if patient tolerates (did not previously) Bp recheck next visit and discussed if cannot reach goal, would need additional medication for diastolic BP.

## 2014-02-24 DIAGNOSIS — H25819 Combined forms of age-related cataract, unspecified eye: Secondary | ICD-10-CM | POA: Diagnosis not present

## 2014-02-24 DIAGNOSIS — H43819 Vitreous degeneration, unspecified eye: Secondary | ICD-10-CM | POA: Diagnosis not present

## 2014-03-18 ENCOUNTER — Other Ambulatory Visit: Payer: Self-pay | Admitting: Family Medicine

## 2014-03-21 ENCOUNTER — Other Ambulatory Visit: Payer: Self-pay | Admitting: Internal Medicine

## 2014-04-05 DIAGNOSIS — Z85828 Personal history of other malignant neoplasm of skin: Secondary | ICD-10-CM | POA: Diagnosis not present

## 2014-04-05 DIAGNOSIS — L821 Other seborrheic keratosis: Secondary | ICD-10-CM | POA: Diagnosis not present

## 2014-04-05 DIAGNOSIS — D485 Neoplasm of uncertain behavior of skin: Secondary | ICD-10-CM | POA: Diagnosis not present

## 2014-04-05 DIAGNOSIS — Z08 Encounter for follow-up examination after completed treatment for malignant neoplasm: Secondary | ICD-10-CM | POA: Diagnosis not present

## 2014-06-23 DIAGNOSIS — K22711 Barrett's esophagus with high grade dysplasia: Secondary | ICD-10-CM | POA: Diagnosis not present

## 2014-06-23 DIAGNOSIS — Z9889 Other specified postprocedural states: Secondary | ICD-10-CM | POA: Diagnosis not present

## 2014-06-23 DIAGNOSIS — K2970 Gastritis, unspecified, without bleeding: Secondary | ICD-10-CM | POA: Diagnosis not present

## 2014-06-23 DIAGNOSIS — K319 Disease of stomach and duodenum, unspecified: Secondary | ICD-10-CM | POA: Diagnosis not present

## 2014-06-23 DIAGNOSIS — K2270 Barrett's esophagus without dysplasia: Secondary | ICD-10-CM | POA: Diagnosis not present

## 2014-06-23 DIAGNOSIS — K449 Diaphragmatic hernia without obstruction or gangrene: Secondary | ICD-10-CM | POA: Diagnosis not present

## 2014-06-27 ENCOUNTER — Encounter: Payer: Self-pay | Admitting: Family Medicine

## 2014-07-05 DIAGNOSIS — Z85828 Personal history of other malignant neoplasm of skin: Secondary | ICD-10-CM | POA: Diagnosis not present

## 2014-07-05 DIAGNOSIS — L821 Other seborrheic keratosis: Secondary | ICD-10-CM | POA: Diagnosis not present

## 2014-07-05 DIAGNOSIS — B079 Viral wart, unspecified: Secondary | ICD-10-CM | POA: Diagnosis not present

## 2014-07-05 DIAGNOSIS — Z08 Encounter for follow-up examination after completed treatment for malignant neoplasm: Secondary | ICD-10-CM | POA: Diagnosis not present

## 2014-07-05 DIAGNOSIS — L570 Actinic keratosis: Secondary | ICD-10-CM | POA: Diagnosis not present

## 2014-08-11 ENCOUNTER — Ambulatory Visit: Payer: Medicare Other | Admitting: Family Medicine

## 2014-08-16 ENCOUNTER — Ambulatory Visit (INDEPENDENT_AMBULATORY_CARE_PROVIDER_SITE_OTHER): Payer: Medicare Other | Admitting: Family Medicine

## 2014-08-16 ENCOUNTER — Encounter: Payer: Self-pay | Admitting: Family Medicine

## 2014-08-16 VITALS — BP 138/80 | HR 58 | Temp 98.3°F | Wt 164.0 lb

## 2014-08-16 DIAGNOSIS — G4700 Insomnia, unspecified: Secondary | ICD-10-CM

## 2014-08-16 DIAGNOSIS — I480 Paroxysmal atrial fibrillation: Secondary | ICD-10-CM | POA: Diagnosis not present

## 2014-08-16 DIAGNOSIS — G250 Essential tremor: Secondary | ICD-10-CM

## 2014-08-16 DIAGNOSIS — I10 Essential (primary) hypertension: Secondary | ICD-10-CM | POA: Diagnosis not present

## 2014-08-16 MED ORDER — PRIMIDONE 50 MG PO TABS: 50.0000 mg | ORAL_TABLET | Freq: Every day | ORAL | Status: DC

## 2014-08-16 NOTE — Assessment & Plan Note (Signed)
CHADsvasc of 2. Patient continues to decline anticoagulation and asa not indicated with barrett's. Starting primidone and patient does not want to start more than 1 med at a time. He opted for 3 month return. If tremor is improved, would consider reaching out to Dr. Marilynne Halsted at [facility] to see if he is ok with NOAC

## 2014-08-16 NOTE — Assessment & Plan Note (Signed)
Controlled with Propranolol 20mg  AM, 10mg  PM, continue

## 2014-08-16 NOTE — Assessment & Plan Note (Signed)
Continue propranolol 20mg  AM, 10mg  PM. Follow up 3 months as adding low dose primidone 50mg , next step 100mg  qhs or 50mg  BID

## 2014-08-16 NOTE — Patient Instructions (Addendum)
Trial a very low dose primidone 50mg  at bedtime to see if that helps with your tremor. Continue propranolol. Follow up in 3 months and if tolerating and tremor not improved, may increase dose.   We discussed risk of stroke off of blood thinner with  intermittent atrial fibrillation (normal rhythm today). Risk 2.2% per year. Would recommend blood thinner. You want to make 1 change at a time. Next visit, we may reach out to Dr. Marilynne Halsted if you are ready to consider that change  Blood pressure looks better/at goal

## 2014-08-16 NOTE — Progress Notes (Signed)
Garret Reddish, MD  Subjective:  Larry Mejia is a 74 y.o. year old very pleasant male patient who presents with:  Hypertension-improved controlled with increase to propranolol 20mg  in AM and 10mg  in PM Essential tremor- poor control despite increased beta blocker. Really troubling him. Most trouble with writing though will spill liquids at times as well INsomnia- remains an issue, with difficulty both getting to sleep and maintaining sleep (bigger issue) BP Readings from Last 3 Encounters:  08/16/14 138/80  02/09/14 140/96  12/30/13 140/83  Compliant with medications-yes without side effects ROS-Denies any CP, HA, SOB, blurry vision, LE edema, transient weakness, orthopnea, PND.   PAF- rate controlled Feels palpitatoins/knows he goes into a fib 2-3x a week but then comes out. Not on aspirin due to barrett's though he has not talked with cards or [facility] who cares for Barrett's ( Dr. 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 esomeprazole (NEXIUM) 40 MG capsule Take 40 mg by mouth 2 (two) times daily.     Marland Kitchen loratadine (CLARITIN) 10 MG tablet Take 10 mg by mouth as needed.     . propranolol (INDERAL) 20 MG tablet TAKE 1 TABLET TWICE DAILY. 180 tablet 3  . acetaminophen (TYLENOL) 325 MG tablet Take 650 mg by mouth every 6 (six) hours as needed.         Objective: BP 138/80 mmHg  Pulse 58  Temp(Src) 98.3 F (36.8 C)  Wt 164 lb (74.39 kg) Gen: NAD, resting comfortably CV: RRR no murmurs rubs or gallops (NSR today) Lungs: CTAB no crackles, wheeze, rhonchi Abdomen: soft/nontender/nondistended/normal bowel sounds. No rebound or guarding.  Ext: no edema Skin: warm, dry, no rash Neuro: intention tremor noted    Assessment/Plan:  Paroxysmal atrial fibrillation CHADsvasc of 2. Patient continues to decline anticoagulation and asa not indicated with barrett's. Starting primidone and patient does not want to start more than 1 med at a time. He opted for 3 month return. If tremor is improved, would consider reaching out to [facility] at [facility] to see if he is ok with NOAC  HTN (hypertension) Controlled with Propranolol 20mg  AM, 10mg  PM, continue  Essential tremor Continue propranolol 20mg  AM, 10mg  PM. Follow up 3 months as adding low dose primidone 50mg , next step 100mg  qhs or 50mg  BID  Insomnia Failed trazodone even at 25mg , trazodone, high dose melatonin. Primidone can cause some sleepiness so this may help but if not advised melatonin low dose 1-2mg  nightly  3 months  Meds ordered this encounter  Medications  . primidone (MYSOLINE) 50 MG tablet    Sig: Take 1 tablet (50 mg total) by mouth at bedtime.    Dispense:  30 tablet    Refill:  5

## 2014-08-16 NOTE — Assessment & Plan Note (Signed)
Failed trazodone even at 25mg , trazodone, high dose melatonin. Primidone can cause some sleepiness so this may help but if not advised melatonin low dose 1-2mg  nightly

## 2014-11-16 ENCOUNTER — Encounter: Payer: Self-pay | Admitting: Family Medicine

## 2014-11-16 ENCOUNTER — Ambulatory Visit (INDEPENDENT_AMBULATORY_CARE_PROVIDER_SITE_OTHER): Payer: Medicare Other | Admitting: Family Medicine

## 2014-11-16 VITALS — BP 122/80 | HR 45 | Temp 97.8°F | Wt 168.0 lb

## 2014-11-16 DIAGNOSIS — I480 Paroxysmal atrial fibrillation: Secondary | ICD-10-CM

## 2014-11-16 DIAGNOSIS — Z23 Encounter for immunization: Secondary | ICD-10-CM | POA: Diagnosis not present

## 2014-11-16 DIAGNOSIS — G250 Essential tremor: Secondary | ICD-10-CM | POA: Diagnosis not present

## 2014-11-16 DIAGNOSIS — I10 Essential (primary) hypertension: Secondary | ICD-10-CM | POA: Diagnosis not present

## 2014-11-16 DIAGNOSIS — G4700 Insomnia, unspecified: Secondary | ICD-10-CM

## 2014-11-16 NOTE — Assessment & Plan Note (Signed)
S:Writing issues mainly. Alcohol works. Already on propranolol for a fib. Primidone 50mg  started for tremor. On this dose, patient noted Low hr- 40s, tired, unsteady. When he stopped medication- symptoms resolved A/P: continue on propranolol. Do not think HR issues were related to primidone given HR 46 today on primidone but patient did feel poorly on it so continue off. Can use alcohol prn if in safe situation to use in future.

## 2014-11-16 NOTE — Assessment & Plan Note (Signed)
S: occasionally has rapid heart beat but usually in 50s and 60s.. HR today irregularly irregular and in 40s.  A/P: reduce dose of propranolol to 20mg  in Am and 10mg  in PM. Declines anticoag and asa not indicated with barretts. Considered a fib clinic but patient declines. Would need to check with GI before starting NOAC.

## 2014-11-16 NOTE — Patient Instructions (Addendum)
Flu shot received today.  Hear rate low- just take 1 of propranolol in morning instead of 1.5, continue to take 1/2 tab in evening. Let's try to keep HR more in 50-70 range.   Essential tremor- continued as needed alcohol  In appropriate situations since our medical therapies haven't helped. Sorry the primidone didn't help.   Glad sleep is better

## 2014-11-16 NOTE — Progress Notes (Signed)
Garret Reddish, MD  Subjective:  Larry Mejia is a 74 y.o. year old very pleasant male patient who presents for/with See problem oriented charting ROS- occaasional heart racing but no chest pain, shortness of breath, lightheadedness. Tremor in bilateral hands- worse with small writing pattern-ok on large boards.   Past Medical History-  Patient Active Problem List   Diagnosis Date Noted  . Paroxysmal atrial fibrillation     Priority: High  . SVT (supraventricular tachycardia)     Priority: Medium  . HTN (hypertension)     Priority: Medium  . Essential tremor     Priority: Medium  . Melanoma of skin 05/31/2008    Priority: Medium  . GERD 07/30/2007    Priority: Medium  . BARRETTS ESOPHAGUS 07/30/2007    Priority: Medium  . Allergic rhinitis 02/09/2014    Priority: Low  . Insomnia 02/09/2014    Priority: Low  . Mitral regurgitation     Priority: Low  . Aortic insufficiency     Priority: Low  . RBBB (right bundle branch block)     Priority: Low  . NEPHROLITHIASIS, HX OF 05/31/2008    Priority: Low  . BPH (benign prostatic hyperplasia) 05/31/2008    Priority: Low    Medications- reviewed and updated Current Outpatient Prescriptions  Medication Sig Dispense Refill  . acetaminophen (TYLENOL) 325 MG tablet Take 650 mg by mouth every 6 (six) hours as needed.      Marland Kitchen esomeprazole (NEXIUM) 40 MG capsule Take 40 mg by mouth 2 (two) times daily.     Marland Kitchen loratadine (CLARITIN) 10 MG tablet Take 10 mg by mouth as needed.     . propranolol (INDERAL) 20 MG tablet TAKE 1 TABLET TWICE DAILY. 180 tablet 3   No current facility-administered medications for this visit.    Objective: BP 122/80 mmHg  Pulse 45  Temp(Src) 97.8 F (36.6 C)  Wt 168 lb (76.204 kg) Gen: NAD, resting comfortably CV: HR 46 manually. Irregularly irregular and bradycardic. no murmurs rubs or gallops Lungs: CTAB no crackles, wheeze, rhonchi Abdomen: soft/nontender/nondistended/normal bowel sounds. No  rebound or guarding.  Ext: no edema Skin: warm, dry Neuro: grossly normal, moves all extremities  Assessment/Plan:  Essential tremor S:Writing issues mainly. Alcohol works. Already on propranolol for a fib. Primidone 50mg  started for tremor. On this dose, patient noted Low hr- 40s, tired, unsteady. When he stopped medication- symptoms resolved A/P: continue on propranolol. Do not think HR issues were related to primidone given HR 46 today on primidone but patient did feel poorly on it so continue off. Can use alcohol prn if in safe situation to use in future.    HTN (hypertension) S: patient actually taking propranolol 20mg  1.5 in AM 1/2 at night. So 30mg  and 10mg . HR today in mid 40s. BP controlled BP Readings from Last 3 Encounters:  11/16/14 122/80  08/16/14 138/80  02/09/14 140/96  A/P: reduce propranolol to 20mg - full pill in AM, half pill in PM. Still needs this on board given at times tachycardia reporteldy though asymptomatic except increased pulse   Paroxysmal atrial fibrillation S: occasionally has rapid heart beat but usually in 50s and 60s.. HR today irregularly irregular and in 40s.  A/P: reduce dose of propranolol to 20mg  in Am and 10mg  in PM. Declines anticoag and asa not indicated with barretts. Considered a fib clinic but patient declines. Would need to check with GI before starting NOAC.    Insomnia S: reasonable control with prn melatonin. Better for  him than trazodone or ambien A/P: continue prn melatonin  6 month follow up at latest  Orders Placed This Encounter  Procedures  . Flu Vaccine QUAD 36+ mos IM

## 2014-11-16 NOTE — Assessment & Plan Note (Signed)
S: reasonable control with prn melatonin. Better for him than trazodone or ambien A/P: continue prn melatonin

## 2014-11-16 NOTE — Assessment & Plan Note (Addendum)
S: patient actually taking propranolol 20mg  1.5 in AM 1/2 at night. So 30mg  and 10mg . HR today in mid 40s. BP controlled BP Readings from Last 3 Encounters:  11/16/14 122/80  08/16/14 138/80  02/09/14 140/96  A/P: reduce propranolol to 20mg - full pill in AM, half pill in PM. Still needs this on board given at times tachycardia reporteldy though asymptomatic except increased pulse

## 2015-02-01 DIAGNOSIS — D1801 Hemangioma of skin and subcutaneous tissue: Secondary | ICD-10-CM | POA: Diagnosis not present

## 2015-02-01 DIAGNOSIS — L821 Other seborrheic keratosis: Secondary | ICD-10-CM | POA: Diagnosis not present

## 2015-02-01 DIAGNOSIS — L820 Inflamed seborrheic keratosis: Secondary | ICD-10-CM | POA: Diagnosis not present

## 2015-02-01 DIAGNOSIS — Z85820 Personal history of malignant melanoma of skin: Secondary | ICD-10-CM | POA: Diagnosis not present

## 2015-02-01 DIAGNOSIS — D485 Neoplasm of uncertain behavior of skin: Secondary | ICD-10-CM | POA: Diagnosis not present

## 2015-02-01 DIAGNOSIS — C44519 Basal cell carcinoma of skin of other part of trunk: Secondary | ICD-10-CM | POA: Diagnosis not present

## 2015-02-01 DIAGNOSIS — Z85828 Personal history of other malignant neoplasm of skin: Secondary | ICD-10-CM | POA: Diagnosis not present

## 2015-02-01 DIAGNOSIS — L570 Actinic keratosis: Secondary | ICD-10-CM | POA: Diagnosis not present

## 2015-02-01 DIAGNOSIS — L812 Freckles: Secondary | ICD-10-CM | POA: Diagnosis not present

## 2015-02-01 DIAGNOSIS — B078 Other viral warts: Secondary | ICD-10-CM | POA: Diagnosis not present

## 2015-02-28 DIAGNOSIS — H43813 Vitreous degeneration, bilateral: Secondary | ICD-10-CM | POA: Diagnosis not present

## 2015-02-28 DIAGNOSIS — H5203 Hypermetropia, bilateral: Secondary | ICD-10-CM | POA: Diagnosis not present

## 2015-02-28 DIAGNOSIS — H52223 Regular astigmatism, bilateral: Secondary | ICD-10-CM | POA: Diagnosis not present

## 2015-02-28 DIAGNOSIS — H25813 Combined forms of age-related cataract, bilateral: Secondary | ICD-10-CM | POA: Diagnosis not present

## 2015-03-20 DIAGNOSIS — Z85828 Personal history of other malignant neoplasm of skin: Secondary | ICD-10-CM | POA: Diagnosis not present

## 2015-04-17 ENCOUNTER — Other Ambulatory Visit: Payer: Self-pay | Admitting: Family Medicine

## 2015-04-25 DIAGNOSIS — H18412 Arcus senilis, left eye: Secondary | ICD-10-CM | POA: Diagnosis not present

## 2015-04-25 DIAGNOSIS — H18411 Arcus senilis, right eye: Secondary | ICD-10-CM | POA: Diagnosis not present

## 2015-04-25 DIAGNOSIS — H2512 Age-related nuclear cataract, left eye: Secondary | ICD-10-CM | POA: Diagnosis not present

## 2015-04-25 DIAGNOSIS — H2511 Age-related nuclear cataract, right eye: Secondary | ICD-10-CM | POA: Diagnosis not present

## 2015-05-26 DIAGNOSIS — H52222 Regular astigmatism, left eye: Secondary | ICD-10-CM | POA: Diagnosis not present

## 2015-05-26 DIAGNOSIS — H5202 Hypermetropia, left eye: Secondary | ICD-10-CM | POA: Diagnosis not present

## 2015-05-26 DIAGNOSIS — H2512 Age-related nuclear cataract, left eye: Secondary | ICD-10-CM | POA: Diagnosis not present

## 2015-05-26 DIAGNOSIS — H25812 Combined forms of age-related cataract, left eye: Secondary | ICD-10-CM | POA: Diagnosis not present

## 2015-05-26 DIAGNOSIS — H2511 Age-related nuclear cataract, right eye: Secondary | ICD-10-CM | POA: Diagnosis not present

## 2015-06-19 DIAGNOSIS — H52223 Regular astigmatism, bilateral: Secondary | ICD-10-CM | POA: Diagnosis not present

## 2015-06-19 DIAGNOSIS — H25811 Combined forms of age-related cataract, right eye: Secondary | ICD-10-CM | POA: Diagnosis not present

## 2015-06-19 DIAGNOSIS — H5203 Hypermetropia, bilateral: Secondary | ICD-10-CM | POA: Diagnosis not present

## 2015-06-19 DIAGNOSIS — H2511 Age-related nuclear cataract, right eye: Secondary | ICD-10-CM | POA: Diagnosis not present

## 2015-07-20 DIAGNOSIS — K219 Gastro-esophageal reflux disease without esophagitis: Secondary | ICD-10-CM | POA: Diagnosis not present

## 2015-07-20 DIAGNOSIS — Z79899 Other long term (current) drug therapy: Secondary | ICD-10-CM | POA: Diagnosis not present

## 2015-07-20 DIAGNOSIS — K2270 Barrett's esophagus without dysplasia: Secondary | ICD-10-CM | POA: Diagnosis not present

## 2015-07-20 DIAGNOSIS — Z85828 Personal history of other malignant neoplasm of skin: Secondary | ICD-10-CM | POA: Diagnosis not present

## 2015-07-20 DIAGNOSIS — J302 Other seasonal allergic rhinitis: Secondary | ICD-10-CM | POA: Diagnosis not present

## 2015-07-20 DIAGNOSIS — K22711 Barrett's esophagus with high grade dysplasia: Secondary | ICD-10-CM | POA: Diagnosis not present

## 2015-07-20 DIAGNOSIS — Z9049 Acquired absence of other specified parts of digestive tract: Secondary | ICD-10-CM | POA: Diagnosis not present

## 2015-07-20 DIAGNOSIS — Z09 Encounter for follow-up examination after completed treatment for conditions other than malignant neoplasm: Secondary | ICD-10-CM | POA: Diagnosis not present

## 2015-07-20 DIAGNOSIS — Z85820 Personal history of malignant melanoma of skin: Secondary | ICD-10-CM | POA: Diagnosis not present

## 2015-07-20 DIAGNOSIS — Z87891 Personal history of nicotine dependence: Secondary | ICD-10-CM | POA: Diagnosis not present

## 2015-07-20 DIAGNOSIS — Z91041 Radiographic dye allergy status: Secondary | ICD-10-CM | POA: Diagnosis not present

## 2015-07-20 DIAGNOSIS — K22719 Barrett's esophagus with dysplasia, unspecified: Secondary | ICD-10-CM | POA: Diagnosis not present

## 2015-07-20 DIAGNOSIS — G250 Essential tremor: Secondary | ICD-10-CM | POA: Diagnosis not present

## 2015-07-20 DIAGNOSIS — K449 Diaphragmatic hernia without obstruction or gangrene: Secondary | ICD-10-CM | POA: Diagnosis not present

## 2015-07-20 DIAGNOSIS — Z9889 Other specified postprocedural states: Secondary | ICD-10-CM | POA: Diagnosis not present

## 2015-07-20 DIAGNOSIS — R000 Tachycardia, unspecified: Secondary | ICD-10-CM | POA: Diagnosis not present

## 2015-07-26 ENCOUNTER — Encounter: Payer: Self-pay | Admitting: Family Medicine

## 2015-08-03 ENCOUNTER — Ambulatory Visit: Payer: BC Managed Care – PPO | Admitting: Family Medicine

## 2015-10-20 ENCOUNTER — Other Ambulatory Visit: Payer: Self-pay

## 2016-01-10 ENCOUNTER — Ambulatory Visit (INDEPENDENT_AMBULATORY_CARE_PROVIDER_SITE_OTHER): Payer: Medicare Other

## 2016-01-10 DIAGNOSIS — Z23 Encounter for immunization: Secondary | ICD-10-CM | POA: Diagnosis not present

## 2016-02-21 DIAGNOSIS — H26491 Other secondary cataract, right eye: Secondary | ICD-10-CM | POA: Diagnosis not present

## 2016-02-21 DIAGNOSIS — H52223 Regular astigmatism, bilateral: Secondary | ICD-10-CM | POA: Diagnosis not present

## 2016-02-21 DIAGNOSIS — H5203 Hypermetropia, bilateral: Secondary | ICD-10-CM | POA: Diagnosis not present

## 2016-03-06 ENCOUNTER — Ambulatory Visit (INDEPENDENT_AMBULATORY_CARE_PROVIDER_SITE_OTHER): Payer: Medicare Other | Admitting: Family Medicine

## 2016-03-06 ENCOUNTER — Encounter: Payer: Self-pay | Admitting: Family Medicine

## 2016-03-06 VITALS — BP 130/88 | HR 57 | Temp 97.6°F | Ht 70.0 in | Wt 168.0 lb

## 2016-03-06 DIAGNOSIS — F03B18 Unspecified dementia, moderate, with other behavioral disturbance: Secondary | ICD-10-CM | POA: Insufficient documentation

## 2016-03-06 DIAGNOSIS — G250 Essential tremor: Secondary | ICD-10-CM

## 2016-03-06 DIAGNOSIS — R413 Other amnesia: Secondary | ICD-10-CM

## 2016-03-06 DIAGNOSIS — I10 Essential (primary) hypertension: Secondary | ICD-10-CM | POA: Diagnosis not present

## 2016-03-06 DIAGNOSIS — I480 Paroxysmal atrial fibrillation: Secondary | ICD-10-CM

## 2016-03-06 DIAGNOSIS — F0391 Unspecified dementia with behavioral disturbance: Secondary | ICD-10-CM | POA: Insufficient documentation

## 2016-03-06 LAB — COMPREHENSIVE METABOLIC PANEL
ALBUMIN: 4.1 g/dL (ref 3.5–5.2)
ALK PHOS: 59 U/L (ref 39–117)
ALT: 8 U/L (ref 0–53)
AST: 14 U/L (ref 0–37)
BILIRUBIN TOTAL: 0.4 mg/dL (ref 0.2–1.2)
BUN: 19 mg/dL (ref 6–23)
CO2: 28 mEq/L (ref 19–32)
Calcium: 9.2 mg/dL (ref 8.4–10.5)
Chloride: 107 mEq/L (ref 96–112)
Creatinine, Ser: 1.03 mg/dL (ref 0.40–1.50)
GFR: 74.66 mL/min (ref >60.00)
GLUCOSE: 84 mg/dL (ref 70–99)
POTASSIUM: 4.4 meq/L (ref 3.5–5.1)
Sodium: 141 mEq/L (ref 135–145)
TOTAL PROTEIN: 6.3 g/dL (ref 6.0–8.3)

## 2016-03-06 LAB — CBC
HEMATOCRIT: 41.2 % (ref 39.0–52.0)
HEMOGLOBIN: 14.2 g/dL (ref 13.0–17.0)
MCHC: 34.4 g/dL (ref 30.0–36.0)
MCV: 92.3 fl (ref 78.0–100.0)
PLATELETS: 157 10*3/uL (ref 150.0–400.0)
RBC: 4.46 Mil/uL (ref 4.22–5.81)
RDW: 13.4 % (ref 11.5–15.5)
WBC: 4.3 10*3/uL (ref 4.0–10.5)

## 2016-03-06 LAB — TSH: TSH: 0.89 u[IU]/mL (ref 0.35–4.50)

## 2016-03-06 LAB — VITAMIN B12: VITAMIN B 12: 71 pg/mL — AB (ref 211–911)

## 2016-03-06 NOTE — Progress Notes (Signed)
Pre visit review using our clinic review tool, if applicable. No additional management support is needed unless otherwise documented below in the visit note. 

## 2016-03-06 NOTE — Assessment & Plan Note (Signed)
S: controlled last visit on n propranolol 20mg  1.5 AM, 0.5 in PM. Reduced last time to 20 in AM, half pill in PM.  BP Readings from Last 3 Encounters:  03/06/16 130/88  11/16/14 122/80  08/16/14 138/80  A/P:Continue current meds

## 2016-03-06 NOTE — Assessment & Plan Note (Signed)
S: HR usually in 50s. Propranolol reduced last time to 20 AM, 10 PM. Declines anticoagulation. No aspirin as has barretts.  A/P: declines anticoagulation again but willing to start if any MRI findings of CVA. Seems to be moving toward decision to take medication though

## 2016-03-06 NOTE — Progress Notes (Signed)
Subjective:  Larry Mejia is a 76 y.o. year old very pleasant male patient who presents for/with See problem oriented charting ROS- No facial or extremity weakness. No slurred words or trouble swallowing. no blurry vision or double vision. No paresthesias. No word finding difficulties. Memory deficits noted  Past Medical History-  Patient Active Problem List   Diagnosis Date Noted  . Memory loss 03/06/2016    Priority: High  . Paroxysmal atrial fibrillation (HCC)     Priority: High  . SVT (supraventricular tachycardia) (HCC)     Priority: Medium  . HTN (hypertension)     Priority: Medium  . Essential tremor     Priority: Medium  . Melanoma of skin ([place]) 05/31/2008    Priority: Medium  . GERD 07/30/2007    Priority: Medium  . BARRETTS ESOPHAGUS 07/30/2007    Priority: Medium  . Allergic rhinitis 02/09/2014    Priority: Low  . Insomnia 02/09/2014    Priority: Low  . Mitral regurgitation     Priority: Low  . Aortic insufficiency     Priority: Low  . RBBB (right bundle branch block)     Priority: Low  . NEPHROLITHIASIS, HX OF 05/31/2008    Priority: Low  . BPH (benign prostatic hyperplasia) 05/31/2008    Priority: Low    Medications- reviewed and updated Current Outpatient Prescriptions  Medication Sig Dispense Refill  . acetaminophen (TYLENOL) 325 MG tablet Take 650 mg by mouth every 6 (six) hours as needed.      Marland Kitchen esomeprazole (NEXIUM) 40 MG capsule Take 40 mg by mouth 2 (two) times daily.     Marland Kitchen loratadine (CLARITIN) 10 MG tablet Take 10 mg by mouth as needed.     . propranolol (INDERAL) 20 MG tablet TAKE 1 TABLET TWICE DAILY. 180 tablet 2   No current facility-administered medications for this visit.     Objective: BP 130/88 (BP Location: Left Arm, Patient Position: Sitting, Cuff Size: Large)   Pulse (!) 57   Temp 97.6 F (36.4 C) (Oral)   Ht 5\' 10"  (1.778 m)   Wt 168 lb (76.2 kg)   SpO2 97%   BMI 24.11 kg/m  Gen: NAD, resting comfortably CV: RRR no  murmurs rubs or gallops Lungs: CTAB no crackles, wheeze, rhonchi Abdomen: soft/nontender/nondistended/normal bowel sounds. No rebound or guarding.  Ext: no edema Skin: warm, dry Neuro: CN II-XII intact, sensation and reflexes normal throughout, 5/5 muscle strength in bilateral upper and lower extremities. Normal finger to nose. Normal rapid alternating movements. No pronator drift. Normal romberg. Normal gait.   Assessment/Plan:  Memory loss S: has noted memory issues. Wife states he has tried to ignore it/deny it until 2-3 weeks ago when he walked in his office and went to open cpu and could not put the password in. Got really irritated. Wife has noted little increases- will forget to do tasks even within 5 minutes. Has talked to GI but was told that he has to remain on nexium even with concren of memory loss with memory loss. Thinks at least 2 years. Has periods where feels really sharp A/P: MMSE 29/30- just loses for date. This has been a significant drop off for patient though and he does have a fib history and has not been on anticoagulation. Will get bloodwork but also mri because of risk of CVA with a fib. Declines RPR, HIV testing. In regards to MRI- has some prior staples and will run this by with [facility]. Plan repeat MMSE  in 6 months. Extended time spent counseling patient and then after that- patient brought in his wife to review information second time. Known essential tremor- no signs of parkinsons, no resting tremor or rigidity. Also phq2 of 0 and wife confirms no hearing issues at all.   HTN (hypertension) S: controlled last visit on n propranolol 20mg  1.5 AM, 0.5 in PM. Reduced last time to 20 in AM, half pill in PM.  BP Readings from Last 3 Encounters:  03/06/16 130/88  11/16/14 122/80  08/16/14 138/80  A/P:Continue current meds  Essential tremor S: on propranolol. Did not tolerate primidone 50mg  for essential tremor as caused bradycardia in his mind. HR was low last check  in 40s even without medicine. He felt poorly on medicine htough and now avoiding. Uses prn alcohol In safe situations A/P: doing reasonably well with this, poor handwriting though  Paroxysmal atrial fibrillation S: HR usually in 50s. Propranolol reduced last time to 20 AM, 10 PM. Declines anticoagulation. No aspirin as has barretts.  A/P: declines anticoagulation again but willing to start if any MRI findings of CVA. Seems to be moving toward decision to take medication though  6 months  Orders Placed This Encounter  Procedures  . MR Brain W Wo Contrast    Standing Status:   Future    Standing Expiration Date:   05/04/2017    Order Specific Question:   If indicated for the ordered procedure, I authorize the administration of contrast media per Radiology protocol    Answer:   Yes    Order Specific Question:   Reason for Exam (SYMPTOM  OR DIAGNOSIS REQUIRED)    Answer:   memory loss. history of a fib- rule out cva contributing    Order Specific Question:   Preferred imaging location?    Answer:   [facility] (table limit-550lbs)    Order Specific Question:   What is the patient's sedation requirement?    Answer:   No Sedation    Order Specific Question:   Does the patient have a pacemaker or implanted devices?    Answer:   No  . CBC    [place]  . Comprehensive metabolic panel    [place]  . TSH    [place]  . Vitamin B12   Return precautions advised.  Garret Reddish, MD

## 2016-03-06 NOTE — Assessment & Plan Note (Signed)
S: on propranolol. Did not tolerate primidone 50mg  for essential tremor as caused bradycardia in his mind. HR was low last check in 40s even without medicine. He felt poorly on medicine htough and now avoiding. Uses prn alcohol In safe situations A/P: doing reasonably well with this, poor handwriting though

## 2016-03-06 NOTE — Patient Instructions (Signed)
Labs before you leave  We will call you within 3 weeks about your referral to MR brain. May take up to 1-2 months to get this done. If you havent heard in a month then call us  Your memory tested out pretty well today. Let's plan on repeat in 6 months  On metal implants correct?

## 2016-03-06 NOTE — Assessment & Plan Note (Addendum)
S: has noted memory issues. Wife states he has tried to ignore it/deny it until 2-3 weeks ago when he walked in his office and went to open cpu and could not put the password in. Got really irritated. Wife has noted little increases- will forget to do tasks even within 5 minutes. Has talked to GI but was told that he has to remain on nexium even with concren of memory loss with memory loss. Thinks at least 2 years. Has periods where feels really sharp A/P: MMSE 29/30- just loses for date. This has been a significant drop off for patient though and he does have a fib history and has not been on anticoagulation. Will get bloodwork but also mri because of risk of CVA with a fib. Declines RPR, HIV testing. In regards to MRI- has some prior staples and will run this by with [facility]. Plan repeat MMSE in 6 months. Extended time spent counseling patient and then after that- patient brought in his wife to review information second time. Known essential tremor- no signs of parkinsons, no resting tremor or rigidity. Also phq2 of 0 and wife confirms no hearing issues at all.

## 2016-03-07 ENCOUNTER — Ambulatory Visit (INDEPENDENT_AMBULATORY_CARE_PROVIDER_SITE_OTHER): Payer: Medicare Other

## 2016-03-07 DIAGNOSIS — E538 Deficiency of other specified B group vitamins: Secondary | ICD-10-CM

## 2016-03-07 MED ORDER — CYANOCOBALAMIN 1000 MCG/ML IJ SOLN
1000.0000 ug | Freq: Once | INTRAMUSCULAR | Status: AC
  Administered 2016-03-07: 1000 ug via INTRAMUSCULAR

## 2016-03-07 NOTE — Progress Notes (Signed)
Patient got his B 12 injection for vit B12 Deficiency on 03/07/2016. Given by Rosealee Albee CMA

## 2016-03-09 ENCOUNTER — Other Ambulatory Visit: Payer: Medicare Other

## 2016-03-14 ENCOUNTER — Ambulatory Visit: Payer: Medicare Other

## 2016-03-15 ENCOUNTER — Ambulatory Visit: Payer: Medicare Other

## 2016-03-15 ENCOUNTER — Ambulatory Visit (INDEPENDENT_AMBULATORY_CARE_PROVIDER_SITE_OTHER): Payer: Medicare Other

## 2016-03-15 DIAGNOSIS — E538 Deficiency of other specified B group vitamins: Secondary | ICD-10-CM

## 2016-03-15 MED ORDER — CYANOCOBALAMIN 1000 MCG/ML IJ SOLN
1000.0000 ug | Freq: Once | INTRAMUSCULAR | Status: AC
  Administered 2016-03-15: 1000 ug via INTRAMUSCULAR

## 2016-03-15 NOTE — Progress Notes (Signed)
Pt here for Vit B12 injection as pt has a low vit B12 level

## 2016-03-20 ENCOUNTER — Ambulatory Visit
Admission: RE | Admit: 2016-03-20 | Discharge: 2016-03-20 | Disposition: A | Discharge status: Home / Self Care | Payer: Medicare Other | Source: Ambulatory Visit | Attending: Family Medicine | Admitting: Family Medicine

## 2016-03-20 DIAGNOSIS — R413 Other amnesia: Secondary | ICD-10-CM | POA: Diagnosis not present

## 2016-03-20 MED ORDER — GADOBENATE DIMEGLUMINE 529 MG/ML IV SOLN
15.0000 mL | Freq: Once | INTRAVENOUS | Status: AC | PRN
  Administered 2016-03-20: 15 mL via INTRAVENOUS

## 2016-03-22 ENCOUNTER — Ambulatory Visit (INDEPENDENT_AMBULATORY_CARE_PROVIDER_SITE_OTHER): Payer: Medicare Other

## 2016-03-22 DIAGNOSIS — E538 Deficiency of other specified B group vitamins: Secondary | ICD-10-CM

## 2016-03-22 MED ORDER — CYANOCOBALAMIN 1000 MCG/ML IJ SOLN
1000.0000 ug | Freq: Once | INTRAMUSCULAR | Status: AC
  Administered 2016-03-22: 1000 ug via INTRAMUSCULAR

## 2016-03-22 NOTE — Progress Notes (Signed)
Patient got his B12 injection for his Vit B12 deficiency on 03/22/16 at 2.15pm. Given by Rosealee Albee CMA.

## 2016-03-29 ENCOUNTER — Ambulatory Visit (INDEPENDENT_AMBULATORY_CARE_PROVIDER_SITE_OTHER): Payer: Medicare Other

## 2016-03-29 DIAGNOSIS — E538 Deficiency of other specified B group vitamins: Secondary | ICD-10-CM | POA: Diagnosis not present

## 2016-03-29 MED ORDER — CYANOCOBALAMIN 1000 MCG/ML IJ SOLN
1000.0000 ug | Freq: Once | INTRAMUSCULAR | Status: AC
  Administered 2016-03-29: 1000 ug via INTRAMUSCULAR

## 2016-03-29 NOTE — Progress Notes (Addendum)
Pt is here for a Vit B12 injection as his B12 level is low. Patient tolerated injection well.   I have reviewed and agree with note, evaluation, plan.   Garret Reddish, MD

## 2016-04-04 ENCOUNTER — Other Ambulatory Visit (INDEPENDENT_AMBULATORY_CARE_PROVIDER_SITE_OTHER): Payer: Medicare Other

## 2016-04-04 DIAGNOSIS — E538 Deficiency of other specified B group vitamins: Secondary | ICD-10-CM | POA: Diagnosis not present

## 2016-04-04 LAB — VITAMIN B12: Vitamin B-12: 418 pg/mL (ref 211–911)

## 2016-04-05 ENCOUNTER — Other Ambulatory Visit: Payer: Self-pay

## 2016-04-05 DIAGNOSIS — R7989 Other specified abnormal findings of blood chemistry: Secondary | ICD-10-CM

## 2016-04-05 DIAGNOSIS — E538 Deficiency of other specified B group vitamins: Secondary | ICD-10-CM

## 2016-04-08 ENCOUNTER — Other Ambulatory Visit: Payer: Self-pay | Admitting: Dermatology

## 2016-04-08 DIAGNOSIS — D1801 Hemangioma of skin and subcutaneous tissue: Secondary | ICD-10-CM | POA: Diagnosis not present

## 2016-04-08 DIAGNOSIS — C44719 Basal cell carcinoma of skin of left lower limb, including hip: Secondary | ICD-10-CM | POA: Diagnosis not present

## 2016-04-08 DIAGNOSIS — L309 Dermatitis, unspecified: Secondary | ICD-10-CM | POA: Diagnosis not present

## 2016-04-08 DIAGNOSIS — Z85820 Personal history of malignant melanoma of skin: Secondary | ICD-10-CM | POA: Diagnosis not present

## 2016-04-08 DIAGNOSIS — B079 Viral wart, unspecified: Secondary | ICD-10-CM | POA: Diagnosis not present

## 2016-04-08 DIAGNOSIS — D235 Other benign neoplasm of skin of trunk: Secondary | ICD-10-CM | POA: Diagnosis not present

## 2016-04-08 DIAGNOSIS — L814 Other melanin hyperpigmentation: Secondary | ICD-10-CM | POA: Diagnosis not present

## 2016-04-08 DIAGNOSIS — L821 Other seborrheic keratosis: Secondary | ICD-10-CM | POA: Diagnosis not present

## 2016-04-08 DIAGNOSIS — D485 Neoplasm of uncertain behavior of skin: Secondary | ICD-10-CM | POA: Diagnosis not present

## 2016-04-10 ENCOUNTER — Telehealth: Payer: Self-pay

## 2016-04-10 NOTE — Telephone Encounter (Signed)
Call to [facility] to discuss AWV. Asked why and educated on medicare and medicare screens. The patient has mild memory loss and stated he "didn't think this was a good idea". Explained that was ok and he can discuss with Dr. Yong Channel when he comes in July.

## 2016-04-15 ENCOUNTER — Other Ambulatory Visit: Payer: Self-pay | Admitting: Family Medicine

## 2016-04-26 ENCOUNTER — Ambulatory Visit (INDEPENDENT_AMBULATORY_CARE_PROVIDER_SITE_OTHER): Payer: Medicare Other | Admitting: Family Medicine

## 2016-04-26 DIAGNOSIS — E538 Deficiency of other specified B group vitamins: Secondary | ICD-10-CM | POA: Diagnosis not present

## 2016-04-26 MED ORDER — CYANOCOBALAMIN 1000 MCG/ML IJ SOLN
1000.0000 ug | Freq: Once | INTRAMUSCULAR | Status: AC
  Administered 2016-04-26: 1000 ug via INTRAMUSCULAR

## 2016-05-02 DIAGNOSIS — R3121 Asymptomatic microscopic hematuria: Secondary | ICD-10-CM | POA: Diagnosis not present

## 2016-05-02 DIAGNOSIS — R300 Dysuria: Secondary | ICD-10-CM | POA: Diagnosis not present

## 2016-05-02 DIAGNOSIS — R102 Pelvic and perineal pain: Secondary | ICD-10-CM | POA: Diagnosis not present

## 2016-05-21 DIAGNOSIS — Z85828 Personal history of other malignant neoplasm of skin: Secondary | ICD-10-CM | POA: Diagnosis not present

## 2016-05-21 DIAGNOSIS — L570 Actinic keratosis: Secondary | ICD-10-CM | POA: Diagnosis not present

## 2016-05-21 DIAGNOSIS — L905 Scar conditions and fibrosis of skin: Secondary | ICD-10-CM | POA: Diagnosis not present

## 2016-05-23 DIAGNOSIS — R3121 Asymptomatic microscopic hematuria: Secondary | ICD-10-CM | POA: Diagnosis not present

## 2016-05-27 ENCOUNTER — Ambulatory Visit (INDEPENDENT_AMBULATORY_CARE_PROVIDER_SITE_OTHER): Payer: Medicare Other | Admitting: *Deleted

## 2016-05-27 DIAGNOSIS — E538 Deficiency of other specified B group vitamins: Secondary | ICD-10-CM | POA: Diagnosis not present

## 2016-05-27 MED ORDER — CYANOCOBALAMIN 1000 MCG/ML IJ SOLN
1000.0000 ug | Freq: Once | INTRAMUSCULAR | Status: AC
  Administered 2016-05-27: 1000 ug via INTRAMUSCULAR

## 2016-05-27 NOTE — Progress Notes (Signed)
Patient given b12 injection for low b12 history Lab Results  Component Value Date   VITAMINB12 418 04/04/2016  last level was fortunately adequate.   Garret Reddish, MD

## 2016-06-24 ENCOUNTER — Ambulatory Visit (INDEPENDENT_AMBULATORY_CARE_PROVIDER_SITE_OTHER): Payer: Medicare Other | Admitting: *Deleted

## 2016-06-24 DIAGNOSIS — E538 Deficiency of other specified B group vitamins: Secondary | ICD-10-CM | POA: Diagnosis not present

## 2016-06-24 MED ORDER — CYANOCOBALAMIN 1000 MCG/ML IJ SOLN
1000.0000 ug | Freq: Once | INTRAMUSCULAR | Status: AC
  Administered 2016-06-24: 1000 ug via INTRAMUSCULAR

## 2016-06-24 NOTE — Progress Notes (Signed)
Monthly b12 injection provided   Garret Reddish, MD

## 2016-07-24 ENCOUNTER — Ambulatory Visit (INDEPENDENT_AMBULATORY_CARE_PROVIDER_SITE_OTHER): Payer: Medicare Other | Admitting: *Deleted

## 2016-07-24 DIAGNOSIS — E538 Deficiency of other specified B group vitamins: Secondary | ICD-10-CM

## 2016-07-24 MED ORDER — CYANOCOBALAMIN 1000 MCG/ML IJ SOLN
1000.0000 ug | Freq: Once | INTRAMUSCULAR | Status: AC
  Administered 2016-07-24: 1000 ug via INTRAMUSCULAR

## 2016-08-01 ENCOUNTER — Encounter: Payer: Self-pay | Admitting: Family Medicine

## 2016-08-01 DIAGNOSIS — Z09 Encounter for follow-up examination after completed treatment for conditions other than malignant neoplasm: Secondary | ICD-10-CM | POA: Diagnosis not present

## 2016-08-01 DIAGNOSIS — K22711 Barrett's esophagus with high grade dysplasia: Secondary | ICD-10-CM | POA: Diagnosis not present

## 2016-08-01 DIAGNOSIS — Z8719 Personal history of other diseases of the digestive system: Secondary | ICD-10-CM | POA: Diagnosis not present

## 2016-08-01 DIAGNOSIS — K219 Gastro-esophageal reflux disease without esophagitis: Secondary | ICD-10-CM | POA: Diagnosis not present

## 2016-08-01 DIAGNOSIS — K449 Diaphragmatic hernia without obstruction or gangrene: Secondary | ICD-10-CM | POA: Diagnosis not present

## 2016-08-01 DIAGNOSIS — Z87891 Personal history of nicotine dependence: Secondary | ICD-10-CM | POA: Diagnosis not present

## 2016-08-21 ENCOUNTER — Ambulatory Visit (INDEPENDENT_AMBULATORY_CARE_PROVIDER_SITE_OTHER): Payer: Medicare Other

## 2016-08-21 ENCOUNTER — Other Ambulatory Visit: Payer: Self-pay | Admitting: Family Medicine

## 2016-08-21 DIAGNOSIS — E538 Deficiency of other specified B group vitamins: Secondary | ICD-10-CM

## 2016-08-21 MED ORDER — CYANOCOBALAMIN 1000 MCG/ML IJ SOLN
1000.0000 ug | Freq: Once | INTRAMUSCULAR | Status: AC
  Administered 2016-08-21: 1000 ug via INTRAMUSCULAR

## 2016-09-03 ENCOUNTER — Encounter: Payer: Self-pay | Admitting: Family Medicine

## 2016-09-03 ENCOUNTER — Ambulatory Visit (INDEPENDENT_AMBULATORY_CARE_PROVIDER_SITE_OTHER): Payer: Medicare Other | Admitting: Family Medicine

## 2016-09-03 DIAGNOSIS — I480 Paroxysmal atrial fibrillation: Secondary | ICD-10-CM | POA: Diagnosis not present

## 2016-09-03 DIAGNOSIS — R413 Other amnesia: Secondary | ICD-10-CM

## 2016-09-03 DIAGNOSIS — I10 Essential (primary) hypertension: Secondary | ICD-10-CM | POA: Diagnosis not present

## 2016-09-03 DIAGNOSIS — E538 Deficiency of other specified B group vitamins: Secondary | ICD-10-CM

## 2016-09-03 LAB — VITAMIN B12: Vitamin B-12: 322 pg/mL (ref 211–911)

## 2016-09-03 NOTE — Patient Instructions (Signed)
Please stop by lab before you go  We will call you within a week or two about your referral to neurology. If you do not hear within 3 weeks, give [place] a call.

## 2016-09-03 NOTE — Assessment & Plan Note (Signed)
No aspirin due to Barrett's history. Discussed other anticoagulation options which he declines once again. Rate controlled on propranolol-may want to reduce dose if blood pressure improved on follow-up

## 2016-09-03 NOTE — Assessment & Plan Note (Addendum)
S: poorly controlled on propranolol 20mg  in AM, 10mg   in PM last visit. Controlled on repeat. Heart rate on my exam low 50s BP Readings from Last 3 Encounters:  09/03/16 138/82  03/06/16 130/88  11/16/14 122/80  A/P: We discussed blood pressure goal of <140/90. Continue current meds

## 2016-09-03 NOTE — Assessment & Plan Note (Signed)
S: memory loss noted last visit. MMSE was 29/30 at that time- lost point for date. Bloodwork was unrevealing other than extremely low b12 at 71 which was repleted to 418- with monthly injections since that time 5 months ago after injectoins. MRI was normal with no prior strokes and no masses  Forgetting names. Using gps more. Even in our conversation he asked for prior discussions to be repeated a few times.   A/P:MMSE 27/30 today(loses 2 for 3 word recall and 1 for design). His attempt at the interlocking pentagons was substantially different from previous. Also his recall was normal last visit. In conversation his memory also appears to be worse. I am concerned about progression of memory loss and potential for Alzheimer's  We discussed 3 options. #1 continued observation with repeat in 6 months. #2 trial Aricept for mild cognitive impairment/potential Alzheimer's. #3 referral to neurology. Extensive counseling was provided on these options and in the end we decided together for neurology consultation. Don't think this represents Parkinson's but with his history of melanoma in his tremor would ask for neurology's opinion-though it has been thought that it is an essential tremor only.  B12 labwork today.

## 2016-09-03 NOTE — Progress Notes (Addendum)
Subjective:  Larry Mejia is a 76 y.o. year old very pleasant male patient who presents for/with See problem oriented charting ROS- continued issues with  Memory. Poor sleep per baseline. No chest pain or shortness of breath. No headache or blurry vision.    Past Medical History-  Patient Active Problem List   Diagnosis Date Noted  . Vitamin B 12 deficiency 03/29/2016    Priority: High  . Memory loss 03/06/2016    Priority: High  . Paroxysmal atrial fibrillation (HCC)     Priority: High  . SVT (supraventricular tachycardia) (HCC)     Priority: Medium  . HTN (hypertension)     Priority: Medium  . Essential tremor     Priority: Medium  . Melanoma of skin ([place]) 05/31/2008    Priority: Medium  . GERD 07/30/2007    Priority: Medium  . BARRETTS ESOPHAGUS 07/30/2007    Priority: Medium  . Allergic rhinitis 02/09/2014    Priority: Low  . Insomnia 02/09/2014    Priority: Low  . Mitral regurgitation     Priority: Low  . Aortic insufficiency     Priority: Low  . RBBB (right bundle branch block)     Priority: Low  . NEPHROLITHIASIS, HX OF 05/31/2008    Priority: Low  . BPH (benign prostatic hyperplasia) 05/31/2008    Priority: Low    Medications- reviewed and updated Current Outpatient Prescriptions  Medication Sig Dispense Refill  . acetaminophen (TYLENOL) 325 MG tablet Take 650 mg by mouth every 6 (six) hours as needed.      Marland Kitchen esomeprazole (NEXIUM) 40 MG capsule Take 40 mg by mouth 2 (two) times daily.     Marland Kitchen loratadine (CLARITIN) 10 MG tablet Take 10 mg by mouth as needed.     . propranolol (INDERAL) 20 MG tablet TAKE 1 TABLET TWICE DAILY. 180 tablet 0   No current facility-administered medications for this visit.     Objective: BP 138/82   Pulse (!) 47   Temp 97.6 F (36.4 C) (Oral)   Ht 5\' 10"  (1.778 m)   Wt 161 lb 12.8 oz (73.4 kg)   SpO2 96%   BMI 23.22 kg/m  Gen: NAD, resting comfortably CV: RRR no murmurs rubs or gallops. HR 52 on my exam Lungs: CTAB  no crackles, wheeze, rhonchi Abdomen: soft/nontender/nondistended/normal bowel sounds. No rebound or guarding.  Ext: no edema  Assessment/Plan:  HTN (hypertension) S: poorly controlled on propranolol 20mg  in AM, 10mg   in PM last visit. Controlled on repeat. Heart rate on my exam low 50s BP Readings from Last 3 Encounters:  09/03/16 138/82  03/06/16 130/88  11/16/14 122/80  A/P: We discussed blood pressure goal of <140/90. Continue current meds  Memory loss S: memory loss noted last visit. MMSE was 29/30 at that time- lost point for date. Bloodwork was unrevealing other than extremely low b12 at 71 which was repleted to 418- with monthly injections since that time 5 months ago after injectoins. MRI was normal with no prior strokes and no masses  Forgetting names. Using gps more. Even in our conversation he asked for prior discussions to be repeated a few times.   A/P:MMSE 27/30 today(loses 2 for 3 word recall and 1 for design). His attempt at the interlocking pentagons was substantially different from previous. Also his recall was normal last visit. In conversation his memory also appears to be worse. I am concerned about progression of memory loss and potential for Alzheimer's  We discussed 3 options. #  1 continued observation with repeat in 6 months. #2 trial Aricept for mild cognitive impairment/potential Alzheimer's. #3 referral to neurology. Extensive counseling was provided on these options and in the end we decided together for neurology consultation. Don't think this represents Parkinson's but with his history of melanoma in his tremor would ask for neurology's opinion-though it has been thought that it is an essential tremor only.  B12 labwork today.  Paroxysmal atrial fibrillation No aspirin due to Barrett's history. Discussed other anticoagulation options which he declines once again. Rate controlled on propranolol-may want to reduce dose if blood pressure improved on  follow-up  Discussed 6 months.  Orders Placed This Encounter  Procedures  . Vitamin B12  . Ambulatory referral to Neurology    Referral Priority:   Routine    Referral Type:   Consultation    Referral Reason:   Specialty Services Required    Requested Specialty:   Neurology    Number of Visits Requested:   1   The duration of face-to-face time during this visit was greater than 25 minutes. Greater than 50% of this time was spent in counseling, explanation of diagnosis, planning of further management, and/or coordination of care including discussion of follow up options, counseling/encouraging despite the memory loss potential alzheimers concerns.    Return precautions advised.  Garret Reddish, MD

## 2016-09-04 ENCOUNTER — Encounter: Payer: Self-pay | Admitting: Neurology

## 2016-09-04 ENCOUNTER — Ambulatory Visit (INDEPENDENT_AMBULATORY_CARE_PROVIDER_SITE_OTHER): Payer: Medicare Other | Admitting: Neurology

## 2016-09-04 VITALS — BP 130/82 | HR 54 | Ht 70.0 in | Wt 163.0 lb

## 2016-09-04 DIAGNOSIS — G3184 Mild cognitive impairment, so stated: Secondary | ICD-10-CM

## 2016-09-04 MED ORDER — DONEPEZIL HCL 10 MG PO TABS: ORAL_TABLET | ORAL | 11 refills | Status: DC

## 2016-09-04 NOTE — Patient Instructions (Signed)
1. Start Aricept 10mg : Take 1/2 tablet daily for 1 month, then increase to 1 tablet daily 2. Control of blood pressure, cholesterol, as well as physical exercise and brain stimulation exercises are important for brain health 3. Continue to monitor anxiety, stress levels 4. Follow-up in 6 months, call for any changes

## 2016-09-04 NOTE — Progress Notes (Signed)
NEUROLOGY CONSULTATION NOTE  Larry Mejia MRN: [id number] DOB: [birth year]  Referring provider: Dr. Garret Reddish Primary care provider: Dr. Garret Reddish  Reason for consult:  Memory loss  Dear Dr Yong Channel:  Thank you for your kind referral of Larry Mejia for consultation of the above symptoms. Although his history is well known to you, please allow me to reiterate it for the purpose of our medical record. The patient was accompanied to the clinic by his wife who also provides collateral information. Records and images were personally reviewed where available.  HISTORY OF PRESENT ILLNESS: This is a 76 year old right-handed man with a history of hypertension, paroxysmal atrial fibrillation, Barrett esophagus, GERD, tremor, presenting for evaluation of worsening memory. He feels frustrated because he knows something but "can't get the right key." He searches for answers when asked questions. He would walk into a room and stop and think what he went to get. It comes to him in a few seconds. He denies getting lost driving, but now has to pull up a picture if he is given instructions. His wife noticed this worsen in the past year. He likes to tinker with things and knows how to fit pieces together, but describes the sensation as "like somebody else if using that file before I could use it." He denies missing medications. He always loses his sunglasses or keys. His wife is in charge of finances. His wife has noticed more irritation if she tries to help him or asks him a follow-up question to help him. She has expressed some concern about driving and states she has taken over some of it. He had a workup for memory loss with his PCP, and was found to have a low B12 level of 71 last January 2018. TSH normal. I personally reviewed MRI brain with and without contrast which did not show any acute changes. There was generalized volume loss, mild to moderate chronic microvascular disease. He has  been on B12 injections since then and has not noticed much change in cognition. Most recent B12 level yesterday was 322. He is also concerned about long-term Nexium use contributing/causing memory issues. He has had tremors in both hands L>R for the past year, more with action but it does not affect eating or writing. Propranolol may be helping some. It only bothers him when he picks up a glass and moves it horizontally, he denies dropping things. He denies any headaches, dizziness, diplopia, dysarthria/dysphagia, neck/back pain, focal numbness/tingling/weakness, bowel/bladder dysfunction, anosmia. His mother and paternal uncle had memory issues. He denies any significant head injuries. He drinks 1 glass of wine a day. They deny any difficulties with ADLs, no hallucinations or paranoia.   Laboratory Data: Lab Results  Component Value Date   [id number] 536 09/03/2016   Lab Results  Component Value Date   TSH 0.89 03/06/2016     PAST MEDICAL HISTORY: Past Medical History:  Diagnosis Date  . Aortic insufficiency     mild...echo.Marland KitchenMarland Kitchen3/2007....Marland KitchenMarland KitchenAortic root...upper normal ...echo.Marland KitchenMarland Kitchen3/2007  . Barrett esophagus   . BPH (benign prostatic hypertrophy)   . Chest pain    May, 2012  . Colonic polyp   . Ejection fraction    55-60% EF, echo, 2007  . GERD (gastroesophageal reflux disease)   . History of colonic polyps 10/13/2006   No polyps 2015 age 81-no further colonscopies    . HTN (hypertension)   . Malignant melanoma of other specified sites of skin   . Mitral regurgitation  Mild, echo, 2007  . Nephrolithiasis   . Paroxysmal atrial fibrillation (HCC)    One episode remotely  . RBBB (right bundle branch block)   . SVT (supraventricular tachycardia) ([place])   . Tremor    Essential tremor    PAST SURGICAL HISTORY: Past Surgical History:  Procedure Laterality Date  . CHOLECYSTECTOMY  02/13/1999   LC and IOC by Dr Leafy Kindle  . esophageal ablation  08/2013   at least 6 times  . [place]  . melanoma removal Left 1992   shoulder  . MENISCUS REPAIR Right 2012  . MOHS SURGERY  2011   forehead  . NISSEN FUNDOPLICATION  circa 7628   Dr Jeral Pinch  . TONSILLECTOMY  1966  . VASECTOMY  1981    MEDICATIONS: Current Outpatient Prescriptions on File Prior to Visit  Medication Sig Dispense Refill  . acetaminophen (TYLENOL) 325 MG tablet Take 650 mg by mouth every 6 (six) hours as needed.      Marland Kitchen esomeprazole (NEXIUM) 40 MG capsule Take 40 mg by mouth 2 (two) times daily.     Marland Kitchen loratadine (CLARITIN) 10 MG tablet Take 10 mg by mouth as needed.     . propranolol (INDERAL) 20 MG tablet TAKE 1 TABLET TWICE DAILY. 180 tablet 0   No current facility-administered medications on file prior to visit.     ALLERGIES: Allergies  Allergen Reactions  . Iohexol     Contrast dye causes flushing and tingling    FAMILY HISTORY: Family History  Problem Relation Age of Onset  . Stroke Father 32  . Colon cancer Father 27    SOCIAL HISTORY: Social History   Social History  . Marital status: Married    Spouse name: N/A  . Number of children: N/A  . Years of education: N/A   Occupational History  . Not on file.   Social History Main Topics  . Smoking status: Former Smoker    Packs/day: 0.25    Years: 6.00    Types: Cigarettes    Quit date: 02/25/1973  . Smokeless tobacco: Never Used  . Alcohol use 4.2 oz/week    7 Glasses of wine per week  . Drug use: No  . Sexual activity: Not on file   Other Topics Concern  . Not on file   Social History Narrative   Married 1963. 2 sons-1 with barrett's. Lost 1 grandkid at age 81, another 70 living in 73.    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       H[place]ies[place]    REVIEW OF SYSTEMS: Constitutional: No fevers, chills, or sweats, no generalized fatigue, change in appetite Eyes: No visual changes, double  vision, eye pain Ear, nose and throat: No hearing loss, ear pain, nasal congestion, sore throat Cardiovascular: No chest pain, palpitations Respiratory:  No shortness of breath at rest or with exertion, wheezes GastrointestinaI: No nausea, vomiting, diarrhea, abdominal pain, fecal incontinence Genitourinary:  No dysuria, urinary retention or frequency Musculoskeletal:  No neck pain, back pain Integumentary: No rash, pruritus, skin lesions Neurological: as above Psychiatric: No depression, insomnia, anxiety Endocrine: No palpitations, fatigue, diaphoresis, mood swings, change in appetite, change in weight, increased thirst Hematologic/Lymphatic:  No anemia, purpura, petechiae. Allergic/Immunologic: no itchy/runny eyes, nasal congestion, recent allergic reactions, rashes  PHYSICAL EXAM: Vitals:   09/04/16 1355  BP: 130/82  Pulse: Marland Kitchen)  54   General: No acute distress Head:  Normocephalic/atraumatic Eyes: Fundoscopic exam shows bilateral sharp discs, no vessel changes, exudates, or hemorrhages Neck: supple, no paraspinal tenderness, full range of motion Back: No paraspinal tenderness Heart: regular rate and rhythm Lungs: Clear to auscultation bilaterally. Vascular: No carotid bruits. Skin/Extremities: No rash, no edema Neurological Exam: Mental status: alert and oriented to person, place, and time, states he is 76 years old. No dysarthria or aphasia, Fund of knowledge is appropriate.  Remote memory intact.  Attention and concentration are normal.    Able to name objects and repeat phrases.  Montreal Cognitive Assessment  09/04/2016  Visuospatial/ Executive (0/5) 3  Naming (0/3) 3  Attention: Read list of digits (0/2) 2  Attention: Read list of letters (0/1) 1  Attention: Serial 7 subtraction starting at 100 (0/3) 2  Language: Repeat phrase (0/2) 2  Language : Fluency (0/1) 1  Abstraction (0/2) 2  Delayed Recall (0/5) 1  Orientation (0/6) 6  Total 23   Cranial nerves: CN I: not  tested CN II: pupils equal, round and reactive to light, visual fields intact, fundi unremarkable. CN III, IV, VI:  full range of motion, no nystagmus, no ptosis CN V: facial sensation intact CN VII: upper and lower face symmetric CN VIII: hearing intact to finger rub CN IX, X: gag intact, uvula midline CN XI: sternocleidomastoid and trapezius muscles intact CN XII: tongue midline Bulk & Tone: normal, no cogwheeling, no fasciculations. Motor: 5/5 throughout with no pronator drift. Sensation: intact to light touch, cold, pin, vibration and joint position sense.  No extinction to double simultaneous stimulation.  Romberg test negative Deep Tendon Reflexes: +2 throughout, no ankle clonus Plantar responses: downgoing bilaterally Cerebellar: no incoordination on finger to nose, heel to shin. No dysdiadochokinesia Gait: narrow-based and steady, able to tandem walk adequately. Tremor: none noted in office today  IMPRESSION: This is a 76 year old right-handed man with a history of  hypertension, paroxysmal atrial fibrillation, Barrett esophagus, GERD, tremor, presenting for evaluation of worsening memory. His neurological exam is normal, no tremor noted today. 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 We discussed that mild cognitive impairment means there are serious cognitive problems by report and testing but the patient is functioning normally. Around 50% of MCI patients progress to dementia (functional impairment) over 5 years. Dementia implies that ADLs are currently compromised. We discussed starting cholinesterase inhibitors such as Aricept, including side effects and expectations from the medication. He would like to start low dose Aricept 5mg  daily for 1 month, then increase to 10mg  daily. We discussed concerns about Nexium, weighing risks and benefits, particularly since B12 is being replaced already. We discussed effects of mood on memory, he became anxious and slightly agitated  during [facility] testing and had more difficulty, he will discuss this with his PCP if symptoms worsen. We discussed consideration for doing Neurocognitive testing on his next visit. We discussed the importance of control of vascular risk factors, physical exercise, and brain stimulation exercises for brain health. No tremor noted today, symptoms suggestive of Essential tremor, he feels some improvement with Propranolol. He will follow-up in 6 months and knows to call for any changes.   Thank you for allowing me to participate in the care of this patient. Please do not hesitate to call for any questions or concerns.   Ellouise Newer, M.D.  CC: Dr. Yong Channel

## 2016-09-06 DIAGNOSIS — G3184 Mild cognitive impairment, so stated: Secondary | ICD-10-CM | POA: Insufficient documentation

## 2016-09-11 DIAGNOSIS — H4323 Crystalline deposits in vitreous body, bilateral: Secondary | ICD-10-CM | POA: Diagnosis not present

## 2016-09-11 DIAGNOSIS — Z961 Presence of intraocular lens: Secondary | ICD-10-CM | POA: Diagnosis not present

## 2016-09-11 DIAGNOSIS — H43393 Other vitreous opacities, bilateral: Secondary | ICD-10-CM | POA: Diagnosis not present

## 2016-09-11 DIAGNOSIS — H26493 Other secondary cataract, bilateral: Secondary | ICD-10-CM | POA: Diagnosis not present

## 2016-09-19 NOTE — Progress Notes (Signed)
Subjective:   Larry Mejia is a 76 y.o. male who presents for Medicare Annual/Subsequent preventive examination.  Review of Systems:  No ROS.  Medicare Wellness Visit. Additional risk factors are reflected in the social history.  Cardiac Risk Factors include: advanced age (>12men, >55 women);family history of premature cardiovascular disease;hypertension;male gender   Sleep patterns: Sleeps well, feels rested most days. Up to void x 1. Home Safety/Smoke Alarms: Feels safe in home. Smoke alarms in place.  Living environment; residence and Firearm Safety: Lives with wife in 1 story home.  Seat Belt Safety/Bike Helmet: Wears seat belt.   Counseling:   Eye Exam-Last exam 08/2016, yearly Dr Tommy [facility]-Last exam 06/2016, every 6 months.Dr.Tanner   Male:   CCS-Colonoscopy 12/30/2013, diverticulosis. No recall.     PSA-  Lab Results  Component Value Date   PSA 0.89 09/07/2009       Objective:    Vitals: BP 118/70 (BP Location: Right Arm, Patient Position: Sitting, Cuff Size: Normal)   Pulse (!) 52   Ht 5\' 10"  (1.778 m)   Wt 164 lb 3.2 oz (74.5 kg)   SpO2 98%   BMI 23.56 kg/m   Body mass index is 23.56 kg/m.  Tobacco History  Smoking Status  . Former Smoker  . Packs/day: 0.25  . Years: 6.00  . Types: Cigarettes  . Quit date: 02/25/1973  Smokeless Tobacco  . Never Used     Counseling given: No   Past Medical History:  Diagnosis Date  . Aortic insufficiency     mild...echo.Marland KitchenMarland Kitchen3/2007....Marland KitchenMarland KitchenAortic root...upper normal ...echo.Marland KitchenMarland Kitchen3/2007  . Barrett esophagus   . BPH (benign prostatic hypertrophy)   . Chest pain    May, 2012  . Colonic polyp   . Ejection fraction    55-60% EF, echo, 2007  . GERD (gastroesophageal reflux disease)   . History of colonic polyps 10/13/2006   No polyps 2015 age 3-no further colonscopies    . HTN (hypertension)   . Malignant melanoma of other specified sites of skin   . Mitral regurgitation    Mild, echo, 2007  .  Nephrolithiasis   . Paroxysmal atrial fibrillation (HCC)    One episode remotely  . RBBB (right bundle branch block)   . SVT (supraventricular tachycardia) ([place])   . Tremor    Essential tremor   Past Surgical History:  Procedure Laterality Date  . CHOLECYSTECTOMY  02/13/1999   LC and IOC by Dr Leafy Kindle  . esophageal ablation  08/2013   at least 6 times  . [place]  . melanoma removal Left 1992   shoulder  . MENISCUS REPAIR Right 2012  . MOHS SURGERY  2011   forehead  . NISSEN FUNDOPLICATION  circa 7510   Dr Jeral Pinch  . TONSILLECTOMY  1966  . VASECTOMY  1981   Family History  Problem Relation Age of Onset  . Stroke Father 43  . Colon cancer Father 67   History  Sexual Activity  . Sexual activity: Not on file    Outpatient Encounter Prescriptions as of 09/20/2016  Medication Sig  . acetaminophen (TYLENOL) 325 MG tablet Take 650 mg by mouth every 6 (six) hours as needed.    . donepezil (ARICEPT) 10 MG tablet Take 1/2 tablet daily for 1 month, then increase to 1 tablet daily  . esomeprazole (NEXIUM) 40 MG capsule Take 40 mg by mouth 2 (two) times daily.   Marland Kitchen loratadine (CLARITIN) 10 MG tablet Take 10 mg by mouth  as needed.   . propranolol (INDERAL) 20 MG tablet TAKE 1 TABLET TWICE DAILY.  Marland Kitchen Zoster Vac Recomb Adjuvanted [facility]) injection Inject 0.5 mLs into the muscle once.   Facility-Administered Encounter Medications as of 09/20/2016  Medication  . cyanocobalamin ((VITAMIN B-12)) injection 1,000 mcg    Activities of Daily Living In your present state of health, do you have any difficulty performing the following activities: 09/20/2016  Hearing? N  Vision? N  Difficulty concentrating or making decisions? N  Walking or climbing stairs? N  Dressing or bathing? N  Doing errands, shopping? N  Preparing Food and eating ? N  Using the Toilet? N  In the past six months, have you accidently leaked urine? N  Do you have problems with loss of bowel  control? N  Managing your Medications? N  Managing your Finances? N  Housekeeping or managing your Housekeeping? N  Some recent data might be hidden    Patient Care Team: Marin Olp, MD as PCP - General (Family Medicine) Druscilla Brownie, MD as Consulting Physician (Dermatology) Ardelle Lesches, MD as Referring Physician (Gastroenterology)   Assessment:    Physical assessment deferred to PCP.  Exercise Activities and Dietary recommendations Current Exercise Habits: Home exercise routine, Type of exercise: walking, Time (Minutes): 45, Frequency (Times/Week): 4, Weekly Exercise (Minutes/Week): 180, Exercise limited by: None identified   Diet (meal preparation, eat out, water intake, caffeinated beverages, dairy products, fruits and vegetables):   Breakfast: eggs, bacon, toast, coffee Lunch: sandwich Dinner:  Protein and vegetables.    Goals      Patient Stated   . <enter goal here> (pt-stated)          Improve memory.       Fall Risk Fall Risk  09/20/2016 09/04/2016 03/06/2016 10/20/2015 08/16/2014  Falls in the past year? No No No No No   Depression Screen PHQ 2/9 Scores 09/20/2016 03/06/2016 08/16/2014 03/08/2013  PHQ - 2 Score 0 0 0 1    Cognitive Function MMSE - Mini Mental State Exam 09/20/2016  Not completed: Refused   Completed Cognitive assessment on 09/04/2016. Patient has not started Aricept yet, therefore declined testing at this time.   Montreal Cognitive Assessment  09/04/2016  Visuospatial/ Executive (0/5) 3  Naming (0/3) 3  Attention: Read list of digits (0/2) 2  Attention: Read list of letters (0/1) 1  Attention: Serial 7 subtraction starting at 100 (0/3) 2  Language: Repeat phrase (0/2) 2  Language : Fluency (0/1) 1  Abstraction (0/2) 2  Delayed Recall (0/5) 1  Orientation (0/6) 6  Total 23      Immunization History  Administered Date(s) Administered  . Influenza Split 12/03/2010, 03/03/2012  . Influenza, High Dose Seasonal PF 01/10/2016  .  Influenza,inj,Quad PF,36+ Mos 02/10/2013, 02/09/2014, 11/16/2014  . Pneumococcal Conjugate-13 02/09/2014  . Pneumococcal Polysaccharide-23 09/21/2009  . Td 09/21/2009  . Zoster 05/12/2013   Shingrix Vaccine Rx sent to pharmacy.   Screening Tests Health Maintenance  Topic Date Due  . INFLUENZA VACCINE  09/25/2016  . TETANUS/TDAP  09/22/2019  . PNA vac Low Risk Adult  Completed      Plan:    Bring a copy of your advance directives to your next office visit.  Continue doing brain stimulating activities (puzzles, reading, adult coloring books, staying active) to keep memory sharp.   Shingrix Vaccine at pharmacy.  I have personally reviewed and noted the following in the patient's chart:   . Medical and social history . Use of  alcohol, tobacco or illicit drugs  . Current medications and supplements . Functional ability and status . Nutritional status . Physical activity . Advanced directives . List of other physicians . Hospitalizations, surgeries, and ER visits in previous 12 months . Vitals . Screenings to include cognitive, depression, and falls . Referrals and appointments  In addition, I have reviewed and discussed with patient certain preventive protocols, quality metrics, and best practice recommendations. A written personalized care plan for preventive services as well as general preventive health recommendations were provided to patient.     Gerilyn Nestle, RN  09/20/2016

## 2016-09-20 ENCOUNTER — Ambulatory Visit (INDEPENDENT_AMBULATORY_CARE_PROVIDER_SITE_OTHER): Payer: Medicare Other

## 2016-09-20 VITALS — BP 118/70 | HR 52 | Ht 70.0 in | Wt 164.2 lb

## 2016-09-20 DIAGNOSIS — Z0000 Encounter for general adult medical examination without abnormal findings: Secondary | ICD-10-CM

## 2016-09-20 DIAGNOSIS — E538 Deficiency of other specified B group vitamins: Secondary | ICD-10-CM

## 2016-09-20 DIAGNOSIS — Z23 Encounter for immunization: Secondary | ICD-10-CM

## 2016-09-20 MED ORDER — ZOSTER VAC RECOMB ADJUVANTED 50 MCG/0.5ML IM SUSR: 0.5000 mL | Freq: Once | INTRAMUSCULAR | 1 refills | Status: AC

## 2016-09-20 MED ORDER — CYANOCOBALAMIN 1000 MCG/ML IJ SOLN
1000.0000 ug | Freq: Once | INTRAMUSCULAR | Status: AC
  Administered 2016-09-20: 1000 ug via INTRAMUSCULAR

## 2016-09-20 NOTE — Patient Instructions (Addendum)
[name] , Thank you for taking time to come for your Medicare Wellness Visit. I appreciate your ongoing commitment to your health goals. Please review the following plan we discussed and let me know if I can assist you in the future.   These are the goals we discussed: Goals      Patient Stated   . <enter goal here> (pt-stated)          Improve memory.        This is a list of the screening recommended for you and due dates:  Health Maintenance  Topic Date Due  . Flu Shot  09/25/2016  . Tetanus Vaccine  09/22/2019  . Pneumonia vaccines  Completed   Bring a copy of your advance directives to your next office visit.  Continue doing brain stimulating activities (puzzles, reading, adult coloring books, staying active) to keep memory sharp.   Shingrix Vaccine at pharmacy.  Fall Prevention in the Home Falls can cause injuries. They can happen to people of all ages. There are many things you can do to make your home safe and to help prevent falls. What can I do on the outside of my home?  Regularly fix the edges of walkways and driveways and fix any cracks.  Remove anything that might make you trip as you walk through a door, such as a raised step or threshold.  Trim any bushes or trees on the path to your home.  Use bright outdoor lighting.  Clear any walking paths of anything that might make someone trip, such as rocks or tools.  Regularly check to see if handrails are loose or broken. Make sure that both sides of any steps have handrails.  Any raised decks and porches should have guardrails on the edges.  Have any leaves, snow, or ice cleared regularly.  Use sand or salt on walking paths during winter.  Clean up any spills in your garage right away. This includes oil or grease spills. What can I do in the bathroom?  Use night lights.  Install grab bars by the toilet and in the tub and shower. Do not use towel bars as grab bars.  Use non-skid mats or decals in  the tub or shower.  If you need to sit down in the shower, use a plastic, non-slip stool.  Keep the floor dry. Clean up any water that spills on the floor as soon as it happens.  Remove soap buildup in the tub or shower regularly.  Attach bath mats securely with double-sided non-slip rug tape.  Do not have throw rugs and other things on the floor that can make you trip. What can I do in the bedroom?  Use night lights.  Make sure that you have a light by your bed that is easy to reach.  Do not use any sheets or blankets that are too big for your bed. They should not hang down onto the floor.  Have a firm chair that has side arms. You can use this for support while you get dressed.  Do not have throw rugs and other things on the floor that can make you trip. What can I do in the kitchen?  Clean up any spills right away.  Avoid walking on wet floors.  Keep items that you use a lot in easy-to-reach places.  If you need to reach something above you, use a strong step stool that has a grab bar.  Keep electrical cords out of the way.  Do not use floor polish or wax that makes floors slippery. If you must use wax, use non-skid floor wax.  Do not have throw rugs and other things on the floor that can make you trip. What can I do with my stairs?  Do not leave any items on the stairs.  Make sure that there are handrails on both sides of the stairs and use them. Fix handrails that are broken or loose. Make sure that handrails are as long as the stairways.  Check any carpeting to make sure that it is firmly attached to the stairs. Fix any carpet that is loose or worn.  Avoid having throw rugs at the top or bottom of the stairs. If you do have throw rugs, attach them to the floor with carpet tape.  Make sure that you have a light switch at the top of the stairs and the bottom of the stairs. If you do not have them, ask someone to add them for you. What else can I do to help prevent  falls?  Wear shoes that: ? Do not have high heels. ? Have rubber bottoms. ? Are comfortable and fit you well. ? Are closed at the toe. Do not wear sandals.  If you use a stepladder: ? Make sure that it is fully opened. Do not climb a closed stepladder. ? Make sure that both sides of the stepladder are locked into place. ? Ask someone to hold it for you, if possible.  Clearly mark and make sure that you can see: ? Any grab bars or handrails. ? First and last steps. ? Where the edge of each step is.  Use tools that help you move around (mobility aids) if they are needed. These include: ? Canes. ? Walkers. ? Scooters. ? Crutches.  Turn on the lights when you go into a dark area. Replace any light bulbs as soon as they burn out.  Set up your furniture so you have a clear path. Avoid moving your furniture around.  If any of your floors are uneven, fix them.  If there are any pets around you, be aware of where they are.  Review your medicines with your doctor. Some medicines can make you feel dizzy. This can increase your chance of falling. Ask your doctor what other things that you can do to help prevent falls. This information is not intended to replace advice given to you by your health care provider. Make sure you discuss any questions you have with your health care provider. Document Released: 12/08/2008 Document Revised: 07/20/2015 Document Reviewed: 03/18/2014 Elsevier Interactive Patient Education  2018 [place] Maintenance, Male A healthy lifestyle and preventive care is important for your health and wellness. Ask your health care provider about what schedule of regular examinations is right for you. What should I know about weight and diet? Eat a Healthy Diet  Eat plenty of vegetables, fruits, whole grains, low-fat dairy products, and lean protein.  Do not eat a lot of foods high in solid fats, added sugars, or salt.  Maintain a Healthy  Weight Regular exercise can help you achieve or maintain a healthy weight. You should:  Do at least 150 minutes of exercise each week. The exercise should increase your heart rate and make you sweat (moderate-intensity exercise).  Do strength-training exercises at least twice a week.  Watch Your Levels of Cholesterol and Blood Lipids  Have your blood tested for lipids and cholesterol every 5 years starting at 76  years of age. If you are at high risk for heart disease, you should start having your blood tested when you are 76 years old. You may need to have your cholesterol levels checked more often if: ? Your lipid or cholesterol levels are high. ? You are older than 77 years of age. ? You are at high risk for heart disease.  What should I know about cancer screening? Many types of cancers can be detected early and may often be prevented. Lung Cancer  You should be screened every year for lung cancer if: ? You are a current smoker who has smoked for at least 30 years. ? You are a former smoker who has quit within the past 15 years.  Talk to your health care provider about your screening options, when you should start screening, and how often you should be screened.  Colorectal Cancer  Routine colorectal cancer screening usually begins at 76 years of age and should be repeated every 5-10 years until you are 76 years old. You may need to be screened more often if early forms of precancerous polyps or small growths are found. Your health care provider may recommend screening at an earlier age if you have risk factors for colon cancer.  Your health care provider may recommend using home test kits to check for hidden blood in the stool.  A small camera at the end of a tube can be used to examine your colon (sigmoidoscopy or colonoscopy). This checks for the earliest forms of colorectal cancer.  Prostate and Testicular Cancer  Depending on your age and overall health, your health care  provider may do certain tests to screen for prostate and testicular cancer.  Talk to your health care provider about any symptoms or concerns you have about testicular or prostate cancer.  Skin Cancer  Check your skin from head to toe regularly.  Tell your health care provider about any new moles or changes in moles, especially if: ? There is a change in a mole's size, shape, or color. ? You have a mole that is larger than a pencil eraser.  Always use sunscreen. Apply sunscreen liberally and repeat throughout the day.  Protect yourself by wearing long sleeves, pants, a wide-brimmed hat, and sunglasses when outside.  What should I know about heart disease, diabetes, and high blood pressure?  If you are 70-6 years of age, have your blood pressure checked every 3-5 years. If you are 32 years of age or older, have your blood pressure checked every year. You should have your blood pressure measured twice-once when you are at a hospital or clinic, and once when you are not at a hospital or clinic. Record the average of the two measurements. To check your blood pressure when you are not at a hospital or clinic, you can use: ? An automated blood pressure machine at a pharmacy. ? A home blood pressure monitor.  Talk to your health care provider about your target blood pressure.  If you are between 33-74 years old, ask your health care provider if you should take aspirin to prevent heart disease.  Have regular diabetes screenings by checking your fasting blood sugar level. ? If you are at a normal weight and have a low risk for diabetes, have this test once every three years after the age of 36. ? If you are overweight and have a high risk for diabetes, consider being tested at a younger age or more often.  A one-time screening for  abdominal aortic aneurysm (AAA) by ultrasound is recommended for men aged 36-75 years who are current or former smokers. What should I know about preventing  infection? Hepatitis B If you have a higher risk for hepatitis B, you should be screened for this virus. Talk with your health care provider to find out if you are at risk for hepatitis B infection. Hepatitis C Blood testing is recommended for:  Everyone born from [birth year] through 1965.  Anyone with known risk factors for hepatitis C.  Sexually Transmitted Diseases (STDs)  You should be screened each year for STDs including gonorrhea and chlamydia if: ? You are sexually active and are younger than 76 years of age. ? You are older than 76 years of age and your health care provider tells you that you are at risk for this type of infection. ? Your sexual activity has changed since you were last screened and you are at an increased risk for chlamydia or gonorrhea. Ask your health care provider if you are at risk.  Talk with your health care provider about whether you are at high risk of being infected with HIV. Your health care provider may recommend a prescription medicine to help prevent HIV infection.  What else can I do?  Schedule regular health, dental, and eye exams.  Stay current with your vaccines (immunizations).  Do not use any tobacco products, such as cigarettes, chewing tobacco, and e-cigarettes. If you need help quitting, ask your health care provider.  Limit alcohol intake to no more than 2 drinks per day. One drink equals 12 ounces of beer, 5 ounces of wine, or 1 ounces of hard liquor.  Do not use street drugs.  Do not share needles.  Ask your health care provider for help if you need support or information about quitting drugs.  Tell your health care provider if you often feel depressed.  Tell your health care provider if you have ever been abused or do not feel safe at home. This information is not intended to replace advice given to you by your health care provider. Make sure you discuss any questions you have with your health care provider. Document Released:  08/10/2007 Document Revised: 10/11/2015 Document Reviewed: 11/15/2014 Elsevier Interactive Patient Education  [employer].

## 2016-09-20 NOTE — Progress Notes (Signed)
I have reviewed and agree with note, evaluation, plan.   Stephen Hunter, MD  

## 2016-10-01 DIAGNOSIS — H18411 Arcus senilis, right eye: Secondary | ICD-10-CM | POA: Diagnosis not present

## 2016-10-01 DIAGNOSIS — H18412 Arcus senilis, left eye: Secondary | ICD-10-CM | POA: Diagnosis not present

## 2016-10-01 DIAGNOSIS — Z961 Presence of intraocular lens: Secondary | ICD-10-CM | POA: Diagnosis not present

## 2016-10-01 DIAGNOSIS — I10 Essential (primary) hypertension: Secondary | ICD-10-CM | POA: Diagnosis not present

## 2016-10-01 DIAGNOSIS — H26491 Other secondary cataract, right eye: Secondary | ICD-10-CM | POA: Diagnosis not present

## 2016-10-08 DIAGNOSIS — Z9849 Cataract extraction status, unspecified eye: Secondary | ICD-10-CM | POA: Diagnosis not present

## 2016-10-21 ENCOUNTER — Ambulatory Visit (INDEPENDENT_AMBULATORY_CARE_PROVIDER_SITE_OTHER): Payer: Medicare Other

## 2016-10-21 DIAGNOSIS — E538 Deficiency of other specified B group vitamins: Secondary | ICD-10-CM | POA: Diagnosis not present

## 2016-10-21 MED ORDER — CYANOCOBALAMIN 1000 MCG/ML IJ SOLN
1000.0000 ug | Freq: Once | INTRAMUSCULAR | Status: AC
  Administered 2016-10-21: 1000 ug via INTRAMUSCULAR

## 2016-10-21 NOTE — Progress Notes (Signed)
After obtaining consent, and per orders of Dr. Yong Channel, injection of B12 given by [place]. Patient instructed to remain in clinic for 20 minutes afterwards, and to report any adverse reaction to me immediately.

## 2016-10-30 ENCOUNTER — Telehealth: Payer: Self-pay | Admitting: Neurology

## 2016-10-30 NOTE — Telephone Encounter (Signed)
Returned call.  No answer.  No voicemail/answering machine set up.  Unable to relay message below

## 2016-10-30 NOTE — Telephone Encounter (Signed)
Stop Aricept for a week and see if symptoms go away. Once he is better, call our office and we will plan to start a different medication. Thanks

## 2016-10-30 NOTE — Telephone Encounter (Signed)
Patient called needing to speak with you regarding his Aricept medication. He said he is having an Upset stomach as well as Nausea. He said it usually will start as soon as he wakes up in the morning. He was wondering if there was another Alternative medication. Please Advise. Thanks

## 2016-11-21 ENCOUNTER — Ambulatory Visit: Payer: Medicare Other

## 2016-11-21 ENCOUNTER — Ambulatory Visit (INDEPENDENT_AMBULATORY_CARE_PROVIDER_SITE_OTHER): Payer: Medicare Other | Admitting: *Deleted

## 2016-11-21 DIAGNOSIS — Z23 Encounter for immunization: Secondary | ICD-10-CM

## 2016-11-21 DIAGNOSIS — E538 Deficiency of other specified B group vitamins: Secondary | ICD-10-CM | POA: Diagnosis not present

## 2016-11-21 MED ORDER — CYANOCOBALAMIN 1000 MCG/ML IJ SOLN
1000.0000 ug | INTRAMUSCULAR | Status: DC
  Administered 2016-11-21 – 2017-01-20 (×2): 1000 ug via INTRAMUSCULAR

## 2016-11-21 NOTE — Patient Instructions (Addendum)
Pt here for monthly Vit B12 injection. Given in right delt, pt tolerated well.   ACUTE: Pt concerned with last several months of heavy bleeding with cuts/scrapes/skin tears. Pt states that this seems to be getting worse and it take about 10 minutes to get a cut to stop bleeding. Most recent was a skin tear/cut on his left forearm/wrist which took about 10-15 minutes to stop bleeding and a pressure bandage was applied to decrease bleeding. Pt is not taking any ASA. Spoke with Dr Yong Channel while he patient was her in office and he stated that it is unlikely that the patient has developed any type of coagulation deficiency and it likely because of his age and thinner skin - cuts/tears are deeper d/t thinner skin which take longer to stop bleeding. Pt scheduled with Dr Yong Channel 11/28/16 to discuss this issue and further treatment. Nothing further needed.

## 2016-11-28 ENCOUNTER — Ambulatory Visit: Payer: Medicare Other | Admitting: Family Medicine

## 2016-11-29 ENCOUNTER — Ambulatory Visit (INDEPENDENT_AMBULATORY_CARE_PROVIDER_SITE_OTHER): Payer: Medicare Other | Admitting: Family Medicine

## 2016-11-29 ENCOUNTER — Encounter: Payer: Self-pay | Admitting: Family Medicine

## 2016-11-29 VITALS — BP 160/80 | HR 48 | Temp 98.1°F | Ht 70.0 in | Wt 160.0 lb

## 2016-11-29 DIAGNOSIS — R58 Hemorrhage, not elsewhere classified: Secondary | ICD-10-CM

## 2016-11-29 DIAGNOSIS — R238 Other skin changes: Secondary | ICD-10-CM | POA: Diagnosis not present

## 2016-11-29 DIAGNOSIS — R233 Spontaneous ecchymoses: Secondary | ICD-10-CM

## 2016-11-29 DIAGNOSIS — I10 Essential (primary) hypertension: Secondary | ICD-10-CM

## 2016-11-29 LAB — COMPREHENSIVE METABOLIC PANEL
ALK PHOS: 59 U/L (ref 39–117)
ALT: 10 U/L (ref 0–53)
AST: 13 U/L (ref 0–37)
Albumin: 4.3 g/dL (ref 3.5–5.2)
BUN: 14 mg/dL (ref 6–23)
CALCIUM: 9.3 mg/dL (ref 8.4–10.5)
CO2: 29 mEq/L (ref 19–32)
Chloride: 103 mEq/L (ref 96–112)
Creatinine, Ser: 1.04 mg/dL (ref 0.40–1.50)
GFR: 73.68 mL/min (ref >60.00)
Glucose, Bld: 99 mg/dL (ref 70–99)
POTASSIUM: 3.7 meq/L (ref 3.5–5.1)
Sodium: 139 mEq/L (ref 135–145)
TOTAL PROTEIN: 6.6 g/dL (ref 6.0–8.3)
Total Bilirubin: 0.4 mg/dL (ref 0.2–1.2)

## 2016-11-29 LAB — PROTIME-INR
INR: 1.1 ratio — AB (ref 0.8–1.0)
PROTHROMBIN TIME: 12.1 s (ref 9.6–13.1)

## 2016-11-29 NOTE — Patient Instructions (Signed)
For blood pressure - Continue current meds: but monitor at home and if not <140/90 by next week- return to see [place]  Please stop by lab before you go

## 2016-11-29 NOTE — Assessment & Plan Note (Signed)
S: controlled poorly on propranolol BID. Took extra dose of aricept yesterday or perhaps 2- felt nauseous. Also high stress at home  BP Readings from Last 3 Encounters:  11/29/16 (!) 160/80  09/20/16 118/70  09/04/16 130/82  A/P: We discussed blood pressure goal of <140/90. Continue current meds: but monitor at home and if not <140/90 by next week- return to see [place]

## 2016-11-29 NOTE — Progress Notes (Signed)
Subjective:  Larry Mejia is a 76 y.o. year old very pleasant male patient who presents for/with See problem oriented charting ROS- no abdominal pain, no chest pain or shortness of breath . Easy bruising/bleeding  Past Medical History-  Patient Active Problem List   Diagnosis Date Noted  . Vitamin B 12 deficiency 03/29/2016    Priority: High  . Memory loss 03/06/2016    Priority: High  . Paroxysmal atrial fibrillation (HCC)     Priority: High  . SVT (supraventricular tachycardia) (HCC)     Priority: Medium  . HTN (hypertension)     Priority: Medium  . Essential tremor     Priority: Medium  . Melanoma of skin ([place]) 05/31/2008    Priority: Medium  . GERD 07/30/2007    Priority: Medium  . BARRETTS ESOPHAGUS 07/30/2007    Priority: Medium  . Allergic rhinitis 02/09/2014    Priority: Low  . Insomnia 02/09/2014    Priority: Low  . Mitral regurgitation     Priority: Low  . Aortic insufficiency     Priority: Low  . RBBB (right bundle branch block)     Priority: Low  . NEPHROLITHIASIS, HX OF 05/31/2008    Priority: Low  . BPH (benign prostatic hyperplasia) 05/31/2008    Priority: Low  . Mild cognitive impairment 09/06/2016    Medications- reviewed and updated Current Outpatient Prescriptions  Medication Sig Dispense Refill  . acetaminophen (TYLENOL) 325 MG tablet Take 650 mg by mouth every 6 (six) hours as needed.      . donepezil (ARICEPT) 10 MG tablet Take 1/2 tablet daily for 1 month, then increase to 1 tablet daily 30 tablet 11  . esomeprazole (NEXIUM) 40 MG capsule Take 40 mg by mouth 2 (two) times daily.     Marland Kitchen loratadine (CLARITIN) 10 MG tablet Take 10 mg by mouth as needed.     . propranolol (INDERAL) 20 MG tablet TAKE 1 TABLET TWICE DAILY. 180 tablet 0   Current Facility-Administered Medications  Medication Dose Route Frequency Provider Last Rate Last Dose  . cyanocobalamin ((VITAMIN B-12)) injection 1,000 mcg  1,000 mcg Intramuscular Q30 days Marin Olp, MD   1,000 mcg at 11/21/16 3149    Objective: BP (!) 160/80 (BP Location: Left Arm, Patient Position: Sitting, Cuff Size: Large)   Pulse (!) 48   Temp 98.1 F (36.7 C) (Oral)   Ht 5\' 10"  (1.778 m)   Wt 160 lb (72.6 kg)   SpO2 96%   BMI 22.96 kg/m  Gen: NAD, resting comfortably CV: RRR no murmurs rubs or gallops Lungs: CTAB no crackles, wheeze, rhonchi Abdomen: soft/nontender/nondistended/normal bowel sounds. No rebound or guarding.  Ext: no edema Skin: warm, dry, several bruises noted. 1 bandaid on left arm- healing abrasion  Assessment/Plan:  Bleeding - Plan: Comprehensive metabolic panel, INR/PT Easy bruising - Plan: Comprehensive metabolic panel, INR/PT S: feels like he has been bleeding easier for 3-4 months. Slightly bumping his skin can cause it to bleed or bruise. Does not take any aspirin or anticoagulant. No hisotyr of easy bruising or bleeding prior to this year.  A/P: will get labs as noted. Also tried to order PTT but not covered by medicare. We discussed most likely this is thinningskin due to his age and not sign of more significant process- he will alert [place] to new or worsening symptoms.   HTN (hypertension) S: controlled poorly on propranolol BID. Took extra dose of aricept yesterday or perhaps 2- felt nauseous. Also  high stress at home  BP Readings from Last 3 Encounters:  11/29/16 (!) 160/80  09/20/16 118/70  09/04/16 130/82  A/P: We discussed blood pressure goal of <140/90. Continue current meds: but monitor at home and if not <140/90 by next week- return to see [place]  Future Appointments Date Time Provider [place]  12/19/2016 9:45 AM [facility] NURSE [facility] None  03/06/2017 10:45 AM Marin Olp, MD [facility] None  03/11/2017 3:00 PM Cameron Sprang, MD [facility] None  09/22/2017 1:00 PM Williemae Area, RN [facility] None    Orders Placed This Encounter  Procedures  . Comprehensive metabolic panel    [place]  . INR/PT   Return  precautions advised.  Garret Reddish, MD

## 2016-12-02 ENCOUNTER — Other Ambulatory Visit: Payer: Self-pay | Admitting: Family Medicine

## 2016-12-19 ENCOUNTER — Ambulatory Visit (INDEPENDENT_AMBULATORY_CARE_PROVIDER_SITE_OTHER): Payer: Medicare Other | Admitting: Family Medicine

## 2016-12-19 ENCOUNTER — Encounter: Payer: Self-pay | Admitting: Family Medicine

## 2016-12-19 DIAGNOSIS — E538 Deficiency of other specified B group vitamins: Secondary | ICD-10-CM | POA: Diagnosis not present

## 2016-12-19 MED ORDER — CYANOCOBALAMIN 1000 MCG/ML IJ SOLN
1000.0000 ug | Freq: Once | INTRAMUSCULAR | Status: AC
  Administered 2016-12-19: 1000 ug via INTRAMUSCULAR

## 2016-12-19 NOTE — Progress Notes (Signed)
Monthly B12 shot given. History low b12.   Larry Mejia

## 2017-01-07 DIAGNOSIS — D225 Melanocytic nevi of trunk: Secondary | ICD-10-CM | POA: Diagnosis not present

## 2017-01-07 DIAGNOSIS — L814 Other melanin hyperpigmentation: Secondary | ICD-10-CM | POA: Diagnosis not present

## 2017-01-07 DIAGNOSIS — L821 Other seborrheic keratosis: Secondary | ICD-10-CM | POA: Diagnosis not present

## 2017-01-07 DIAGNOSIS — Z85828 Personal history of other malignant neoplasm of skin: Secondary | ICD-10-CM | POA: Diagnosis not present

## 2017-01-07 DIAGNOSIS — Z85820 Personal history of malignant melanoma of skin: Secondary | ICD-10-CM | POA: Diagnosis not present

## 2017-01-07 DIAGNOSIS — L570 Actinic keratosis: Secondary | ICD-10-CM | POA: Diagnosis not present

## 2017-01-20 ENCOUNTER — Encounter: Payer: Self-pay | Admitting: *Deleted

## 2017-01-20 ENCOUNTER — Ambulatory Visit (INDEPENDENT_AMBULATORY_CARE_PROVIDER_SITE_OTHER): Payer: Medicare Other | Admitting: *Deleted

## 2017-01-20 DIAGNOSIS — Z23 Encounter for immunization: Secondary | ICD-10-CM | POA: Diagnosis not present

## 2017-01-20 DIAGNOSIS — E538 Deficiency of other specified B group vitamins: Secondary | ICD-10-CM

## 2017-01-20 MED ORDER — CYANOCOBALAMIN 1000 MCG/ML IJ SOLN: 1000.0000 ug | Freq: Once | INTRAMUSCULAR | Status: DC

## 2017-01-20 NOTE — Progress Notes (Signed)
Patient has medicare- is he aware that medicare is unlikely to cover this in the office and he will be charged full cost over $200? I generally recommend against shingrix for medicare aged patients.

## 2017-01-20 NOTE — Progress Notes (Signed)
I did ask pt if he contacted his insurance regarding Shingrix injection to make sure covered. Pt said his wife did and it was okay she had hers and it was covered.

## 2017-01-20 NOTE — Progress Notes (Signed)
Pt presented to the office for monthly B12 injection and 1st Shingrix vaccine. Pt tolerated well. Pt told to return in one month for next B12 injection and in 2 months for 2nd Shingrix vaccine to complete the series. Pt verbalized understanding.

## 2017-01-20 NOTE — Progress Notes (Signed)
Otherwise, I have reviewed and agree with note, evaluation, plan.   Garret Reddish, MD

## 2017-02-20 ENCOUNTER — Ambulatory Visit (INDEPENDENT_AMBULATORY_CARE_PROVIDER_SITE_OTHER): Payer: Medicare Other

## 2017-02-20 DIAGNOSIS — E538 Deficiency of other specified B group vitamins: Secondary | ICD-10-CM

## 2017-02-20 MED ORDER — CYANOCOBALAMIN 1000 MCG/ML IJ SOLN
1000.0000 ug | Freq: Once | INTRAMUSCULAR | Status: AC
  Administered 2017-02-20: 1000 ug via INTRAMUSCULAR

## 2017-02-20 NOTE — Progress Notes (Signed)
Patient in today for B12 injection. Patient tolerated well. Patient is scheduling next injection.

## 2017-03-06 ENCOUNTER — Encounter: Payer: Self-pay | Admitting: Family Medicine

## 2017-03-06 ENCOUNTER — Ambulatory Visit (INDEPENDENT_AMBULATORY_CARE_PROVIDER_SITE_OTHER): Payer: Medicare Other | Admitting: Family Medicine

## 2017-03-06 VITALS — BP 124/80 | HR 52 | Temp 97.3°F | Ht 70.0 in | Wt 159.8 lb

## 2017-03-06 DIAGNOSIS — I480 Paroxysmal atrial fibrillation: Secondary | ICD-10-CM | POA: Diagnosis not present

## 2017-03-06 DIAGNOSIS — I10 Essential (primary) hypertension: Secondary | ICD-10-CM

## 2017-03-06 DIAGNOSIS — D692 Other nonthrombocytopenic purpura: Secondary | ICD-10-CM | POA: Diagnosis not present

## 2017-03-06 DIAGNOSIS — E538 Deficiency of other specified B group vitamins: Secondary | ICD-10-CM

## 2017-03-06 DIAGNOSIS — Z8679 Personal history of other diseases of the circulatory system: Secondary | ICD-10-CM | POA: Diagnosis not present

## 2017-03-06 DIAGNOSIS — R39198 Other difficulties with micturition: Secondary | ICD-10-CM | POA: Diagnosis not present

## 2017-03-06 DIAGNOSIS — K2270 Barrett's esophagus without dysplasia: Secondary | ICD-10-CM

## 2017-03-06 MED ORDER — CYANOCOBALAMIN 1000 MCG/ML IJ SOLN
1000.0000 ug | Freq: Once | INTRAMUSCULAR | Status: AC
  Administered 2017-03-06: 1000 ug via INTRAMUSCULAR

## 2017-03-06 NOTE — Assessment & Plan Note (Signed)
Monthly injection today. Has been low normal even with this- continue monthly injections instead of switching over. Being on nexium likely contributes.  Lab Results  Component Value Date   [id number] 903 09/03/2016

## 2017-03-06 NOTE — Assessment & Plan Note (Signed)
No obvious recurrence- has been palpitations free. Last saw Dr. Ron Parker in 2012

## 2017-03-06 NOTE — Assessment & Plan Note (Signed)
S: Saw [facility] 07/2016 for repeat EGD and fortunatley no signs of Barretts. Instructed to continue nexium 40mg  BID A/P: Patient asks about potential links to memory loss and we discussed possible- on other hand- if he were off nexium and developed barrett's again could lead to cancer- he wants to remain on after discussion

## 2017-03-06 NOTE — Addendum Note (Signed)
Addended by: Lucianne Lei M on: 03/06/2017 11:46 AM   Modules accepted: Orders

## 2017-03-06 NOTE — Assessment & Plan Note (Signed)
S:  patient with history paroxysmal a fib x 1 years ago under monitor. Palpitations in the past were thought to possibly be recurrence. His rate has been controlled on propranolol. No asa due to barretts. Declines noac or coumadin A/P: In the past consideration for cardiac monitoring if continued to have palpitations through Dr. Ron Parker in 2012- has not seen cardiology since that time. No longer having palpitations- continue to monitor.

## 2017-03-06 NOTE — Progress Notes (Signed)
Subjective:  Larry Mejia is a 77 y.o. year old very pleasant male patient who presents for/with See problem oriented charting ROS- voiding difficulty. No palpitations. No chest pain or shortness of breath.    Past Medical History-  Patient Active Problem List   Diagnosis Date Noted  . Mild cognitive impairment 09/06/2016    Priority: High  . Vitamin B 12 deficiency 03/29/2016    Priority: High  . Memory loss 03/06/2016    Priority: High  . Paroxysmal atrial fibrillation (HCC)     Priority: High  . History of supraventricular tachycardia     Priority: Medium  . HTN (hypertension)     Priority: Medium  . Essential tremor     Priority: Medium  . Melanoma of skin ([place]) 05/31/2008    Priority: Medium  . GERD 07/30/2007    Priority: Medium  . BARRETTS ESOPHAGUS 07/30/2007    Priority: Medium  . Allergic rhinitis 02/09/2014    Priority: Low  . Insomnia 02/09/2014    Priority: Low  . Mitral regurgitation     Priority: Low  . Aortic insufficiency     Priority: Low  . RBBB (right bundle branch block)     Priority: Low  . NEPHROLITHIASIS, HX OF 05/31/2008    Priority: Low  . BPH (benign prostatic hyperplasia) 05/31/2008    Priority: Low  . Senile purpura ([place]) 03/06/2017  . Voiding difficulty 03/06/2017    Medications- reviewed and updated Current Outpatient Medications  Medication Sig Dispense Refill  . acetaminophen (TYLENOL) 325 MG tablet Take 650 mg by mouth every 6 (six) hours as needed.      . donepezil (ARICEPT) 10 MG tablet Take 1/2 tablet daily for 1 month, then increase to 1 tablet daily 30 tablet 11  . esomeprazole (NEXIUM) 40 MG capsule Take 40 mg by mouth 2 (two) times daily.     Marland Kitchen loratadine (CLARITIN) 10 MG tablet Take 10 mg by mouth as needed.     . propranolol (INDERAL) 20 MG tablet TAKE 1 TABLET TWICE DAILY. 180 tablet 0   Current Facility-Administered Medications  Medication Dose Route Frequency Provider Last Rate Last Dose  . cyanocobalamin  ((VITAMIN B-12)) injection 1,000 mcg  1,000 mcg Intramuscular Q30 days Marin Olp, MD   1,000 mcg at 01/20/17 0940    Objective: BP 124/80 (BP Location: Left Arm, Patient Position: Sitting, Cuff Size: Large)   Pulse (!) 52   Temp (!) 97.3 F (36.3 C) (Oral)   Ht 5\' 10"  (1.778 m)   Wt 159 lb 12.8 oz (72.5 kg)   SpO2 98%   BMI 22.93 kg/m  Gen: NAD, resting comfortably CV: RRR no murmurs rubs or gallops Lungs: CTAB no crackles, wheeze, rhonchi Abdomen: soft/nontender/nondistended/normal bowel sounds.  Ext: no edema Skin: warm, dry, several small bruises on hands and forearms  Assessment/Plan:  HTN (hypertension) S: controlled on propranolol BID 20mg  in AM and 10 mg PIM BP Readings from Last 3 Encounters:  03/06/17 124/80  11/29/16 (!) 160/80  09/20/16 118/70  A/P: We discussed blood pressure goal of <140/90. Continue current meds:  Could consider reducing dose if HR goes below 50 again  History of supraventricular tachycardia No obvious recurrence- has been palpitations free. Last saw Dr. Ron Parker in 2012  Paroxysmal atrial fibrillation S:  patient with history paroxysmal a fib x 1 years ago under monitor. Palpitations in the past were thought to possibly be recurrence. His rate has been controlled on propranolol. No asa due to  barretts. Declines noac or coumadin A/P: In the past consideration for cardiac monitoring if continued to have palpitations through Dr. Ron Parker in 2012- has not seen cardiology since that time. No longer having palpitations- continue to monitor.   BARRETTS ESOPHAGUS S: Saw [facility] 07/2016 for repeat EGD and fortunatley no signs of Barretts. Instructed to continue nexium 40mg  BID A/P: Patient asks about potential links to memory loss and we discussed possible- on other hand- if he were off nexium and developed barrett's again could lead to cancer- he wants to remain on after discussion   Vitamin B 12 deficiency Monthly injection today. Has been low normal  even with this- continue monthly injections instead of switching over. Being on nexium likely contributes.  Lab Results  Component Value Date   [id number] 967 09/03/2016     Senile purpura ([place]) Noted on exam. Not on aspirin. Discussed being cautious about hitting body/skin against options to help minimize  Voiding difficulty Progressive voiding issues over last year- states urinates very small amount each time . No dysuria. Rarely gets up at night- claims to be drinking fair amount of fluids. Really has no symptoms other than small volume and occasional urgency but rare. We discussed possibly doing PSA and UA- at his age we agreed to hold off unless symptoms worsen.   No suprapubic pain or fullness- doubt he has significant urinary distension.    Future Appointments  Date Time Provider [place]  03/11/2017  3:00 PM Cameron Sprang, MD [facility] None  03/24/2017  9:45 AM [facility] NURSE [facility]  06/20/2017  9:45 AM [facility] NURSE [facility]  09/22/2017  1:00 PM Williemae Area, RN [facility]   Return in about 4 months (around 07/04/2017) for follow up- or sooner if needed.  Return precautions advised.  Garret Reddish, MD

## 2017-03-06 NOTE — Patient Instructions (Addendum)
b12 injection today  I know you are doing your best coping with the memory changes- also know this is not easy  Glad you had a good report on the Barretts. I wish we could reduce your dose but since Gi is recommending same dose will not change  For voiding issues- could do a urine test or PSA or just refer to urology for their opinion. Since the only symptom is small volume urine we opted to hold off for now but if you change your mind let us know and we can do any of the above

## 2017-03-06 NOTE — Assessment & Plan Note (Signed)
S: controlled on propranolol BID 20mg  in AM and 10 mg PIM BP Readings from Last 3 Encounters:  03/06/17 124/80  11/29/16 (!) 160/80  09/20/16 118/70  A/P: We discussed blood pressure goal of <140/90. Continue current meds:  Could consider reducing dose if HR goes below 50 again

## 2017-03-06 NOTE — Assessment & Plan Note (Signed)
Noted on exam. Not on aspirin. Discussed being cautious about hitting body/skin against options to help minimize

## 2017-03-06 NOTE — Assessment & Plan Note (Addendum)
Progressive voiding issues over last year- states urinates very small amount each time . No dysuria. Rarely gets up at night- claims to be drinking fair amount of fluids. Really has no symptoms other than small volume and occasional urgency but rare. We discussed possibly doing PSA and UA- at his age we agreed to hold off unless symptoms worsen.   No suprapubic pain or fullness- doubt he has significant urinary distension.

## 2017-03-11 ENCOUNTER — Encounter: Payer: Self-pay | Admitting: Neurology

## 2017-03-11 ENCOUNTER — Ambulatory Visit (INDEPENDENT_AMBULATORY_CARE_PROVIDER_SITE_OTHER): Payer: Medicare Other | Admitting: Neurology

## 2017-03-11 VITALS — BP 136/70 | HR 50 | Ht 70.0 in | Wt 160.0 lb

## 2017-03-11 DIAGNOSIS — G3184 Mild cognitive impairment, so stated: Secondary | ICD-10-CM | POA: Diagnosis not present

## 2017-03-11 DIAGNOSIS — F419 Anxiety disorder, unspecified: Secondary | ICD-10-CM | POA: Diagnosis not present

## 2017-03-11 MED ORDER — DONEPEZIL HCL 10 MG PO TABS: ORAL_TABLET | ORAL | 11 refills | Status: DC

## 2017-03-11 NOTE — Patient Instructions (Addendum)
1. Refer for counseling   We have sent a referral to [facility] for therapy.  Please call [phone] to schedule your first appointment.   2. Continue Donepezil 10mg  daily 3. Follow-up in 6 months, call for any changes   RECOMMENDATIONS FOR ALL PATIENTS WITH MEMORY PROBLEMS: 1. Continue to exercise (Recommend 30 minutes of walking everyday, or 3 hours every week) 2. Increase social interactions - continue going to [place] and enjoy social gatherings with friends and family 3. Eat healthy, avoid fried foods and eat more fruits and vegetables 4. Maintain adequate blood pressure, blood sugar, and blood cholesterol level. Reducing the risk of stroke and cardiovascular disease also helps promoting better memory. 5. Avoid stressful situations. Live a simple life and avoid aggravations. Organize your time and prepare for the next day in anticipation. 6. Sleep well, avoid any interruptions of sleep and avoid any distractions in the bedroom that may interfere with adequate sleep quality 7. Avoid sugar, avoid sweets as there is a strong link between excessive sugar intake, diabetes, and cognitive impairment The Mediterranean diet has been shown to help patients reduce the risk of progressive memory disorders and reduces cardiovascular risk. This includes eating fish, eat fruits and green leafy vegetables, nuts like almonds and hazelnuts, walnuts, and also use olive oil. Avoid fast foods and fried foods as much as possible. Avoid sweets and sugar as sugar use has been linked to worsening of memory function.

## 2017-03-11 NOTE — Progress Notes (Signed)
NEUROLOGY FOLLOW UP OFFICE NOTE  Larry Mejia [id number] Apr 23, 1940  HISTORY OF PRESENT ILLNESS: I had the pleasure of seeing Larry Mejia in follow-up in the neurology clinic on 03/11/2017.  The patient was last seen on 6 months ago for worsening memory. MOCA score in July 2018 was 23/30. He is again accompanied by his wife who helps supplement the history today.  On his last visit, he was started on Aricept 10mg  daily, which he is tolerating without side effects. He reports that his main concern is the "flustration" he is having with his cognitive difficulties, he recognizes he is more confused and it makes him more flustered/frustrated. He can easily be distracted, he would be answering a question and if interrupted, he cannot complete his original thought. He is very, very careful with driving, his wife watches him like a hawk, he usually drives short distances familiar roads without getting lost. He occasionally forgets his medications. He initially had a lot of nausea with the Aricept but was apparently taking double the dose, thinking he was taking dramamine instead. His wife is now in charge solely of the Aricept, he manages his other medications. His wife reports he is more irritable, no paranoia. 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 His family is mostly concerned that he would feel embarrassed if something occurs and this would make him feel bad. He denies any headaches, dizziness, diplopia, focal numbness/tingling/weakness, no falls. Tremor is unchanged, he is on Propranolol without side effects.  HPI 09/04/2016: This is a 77 yo RH man with a history of hypertension, paroxysmal atrial fibrillation, Barrett esophagus, GERD, tremor, with mild cognitive impairment. He feels frustrated because he knows something but "can't get  the right key." He searches for answers when asked questions. He would walk into a room and stop and think what he went to get. It comes to him in a few seconds. He denies getting lost driving, but now has to pull up a picture if he is given instructions. His wife noticed this worsen in the past year. He likes to tinker with things and knows how to fit pieces together, but describes the sensation as "like somebody else if using that file before I could use it." He denies missing medications. He always loses his sunglasses or keys. His wife is in charge of finances. His wife has noticed more irritation if she tries to help him or asks him a follow-up question to help him. She has expressed some concern about driving and states she has taken over some of it. He had a workup for memory loss with his PCP, and was found to have a low B12 level of 71 last January 2018. TSH normal. I personally reviewed MRI brain with and without contrast which did not show any acute changes. There was generalized volume loss, mild to moderate chronic microvascular disease. He has been on B12 injections since then and has not noticed much change in cognition. Most recent B12 level yesterday was 322. He is also concerned about long-term Nexium use contributing/causing memory issues. He has had tremors in both hands L>R for the past year, more with action but it does not affect eating or writing. Propranolol may be helping some. It only bothers him when he picks up a glass and moves it horizontally, he  denies dropping things. He denies any headaches, dizziness, diplopia, dysarthria/dysphagia, neck/back pain, focal numbness/tingling/weakness, bowel/bladder dysfunction, anosmia. His mother and paternal uncle had memory issues. He denies any significant head injuries. He drinks 1 glass of wine a day. They deny any difficulties with ADLs, no hallucinations or paranoia  PAST MEDICAL HISTORY: Past Medical History:  Diagnosis Date  . Aortic  insufficiency     mild...echo.Marland KitchenMarland Kitchen3/2007....Marland KitchenMarland KitchenAortic root...upper normal ...echo.Marland KitchenMarland Kitchen3/2007  . Barrett esophagus   . BPH (benign prostatic hypertrophy)   . Chest pain    May, 2012  . Colonic polyp   . Ejection fraction    55-60% EF, echo, 2007  . GERD (gastroesophageal reflux disease)   . History of colonic polyps 10/13/2006   No polyps 2015 age 50-no further colonscopies    . HTN (hypertension)   . Malignant melanoma of other specified sites of skin   . Mitral regurgitation    Mild, echo, 2007  . Nephrolithiasis   . Paroxysmal atrial fibrillation (HCC)    One episode remotely  . RBBB (right bundle branch block)   . SVT (supraventricular tachycardia) ([place])   . Tremor    Essential tremor    MEDICATIONS: Current Outpatient Medications on File Prior to Visit  Medication Sig Dispense Refill  . acetaminophen (TYLENOL) 325 MG tablet Take 650 mg by mouth every 6 (six) hours as needed.      . donepezil (ARICEPT) 10 MG tablet Take 1/2 tablet daily for 1 month, then increase to 1 tablet daily 30 tablet 11  . esomeprazole (NEXIUM) 40 MG capsule Take 40 mg by mouth 2 (two) times daily.     Marland Kitchen loratadine (CLARITIN) 10 MG tablet Take 10 mg by mouth as needed.     . propranolol (INDERAL) 20 MG tablet TAKE 1 TABLET TWICE DAILY. 180 tablet 0   Current Facility-Administered Medications on File Prior to Visit  Medication Dose Route Frequency Provider Last Rate Last Dose  . cyanocobalamin ((VITAMIN B-12)) injection 1,000 mcg  1,000 mcg Intramuscular Q30 days Marin Olp, MD   1,000 mcg at 01/20/17 0940    ALLERGIES: Allergies  Allergen Reactions  . Iohexol     Contrast dye causes flushing and tingling    FAMILY HISTORY: Family History  Problem Relation Age of Onset  . Stroke Father 31  . Colon cancer Father 65    SOCIAL HISTORY: Social History   Socioeconomic History  . Marital status: Married    Spouse name: Not on file  . Number of children: Not on file  . Years of  education: Not on file  . Highest education level: Not on file  Social Needs  . Financial resource strain: Not on file  . Food insecurity - worry: Not on file  . Food insecurity - inability: Not on file  . Transportation needs - medical: Not on file  . Transportation needs - non-medical: Not on file  Occupational History  . Not on file  Tobacco Use  . Smoking status: Former Smoker    Packs/day: 0.25    Years: 6.00    Pack years: 1.50    Types: Cigarettes    Last attempt to quit: 02/25/1973    Years since quitting: 44.0  . Smokeless tobacco: Never Used  Substance and Sexual Activity  . Alcohol use: Yes    Comment: wine or scotch at night  . Drug use: No  . Sexual activity: Not on file  Other Topics Concern  . Not on file  Social History  Narrative   Married 1963. 2 sons-1 with barrett's. Lost 1 grandkid at age 51, another 6 living in 80.    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       Hobbies[place]    REVIEW OF SYSTEMS: Constitutional: No fevers, chills, or sweats, no generalized fatigue, change in appetite Eyes: No visual changes, double vision, eye pain Ear, nose and throat: No hearing loss, ear pain, nasal congestion, sore throat Cardiovascular: No chest pain, palpitations Respiratory:  No shortness of breath at rest or with exertion, wheezes GastrointestinaI: No nausea, vomiting, diarrhea, abdominal pain, fecal incontinence Genitourinary:  No dysuria, urinary retention or frequency Musculoskeletal:  No neck pain, back pain Integumentary: No rash, pruritus, skin lesions Neurological: as above Psychiatric: No depression, insomnia, anxiety Endocrine: No palpitations, fatigue, diaphoresis, mood swings, change in appetite, change in weight, increased thirst Hematologic/Lymphatic:  No anemia, purpura, petechiae. Allergic/Immunologic: no itchy/runny eyes, nasal  congestion, recent allergic reactions, rashes  PHYSICAL EXAM: Vitals:   03/11/17 1456  BP: 136/70  Pulse: (!) 50  SpO2: 96%   General: No acute distress Head:  Normocephalic/atraumatic Neck: supple, no paraspinal tenderness, full range of motion Heart:  Regular rate and rhythm Lungs:  Clear to auscultation bilaterally Back: No paraspinal tenderness Skin/Extremities: No rash, no edema Neurological Exam: alert and oriented to person, place, and time. No aphasia or dysarthria. Fund of knowledge is appropriate.  Recent and remote memory are intact.  Attention and concentration are normal.    Able to name objects and repeat phrases.  Montreal Cognitive Assessment  03/11/2017 09/04/2016  Visuospatial/ Executive (0/5) 1 3  Naming (0/3) 3 3  Attention: Read list of digits (0/2) 2 2  Attention: Read list of letters (0/1) 1 1  Attention: Serial 7 subtraction starting at 100 (0/3) 2 2  Language: Repeat phrase (0/2) 2 2  Language : Fluency (0/1) 1 1  Abstraction (0/2) 2 2  Delayed Recall (0/5) 3 1  Orientation (0/6) 5 6  Total 22 23   Cranial nerves: Pupils equal, round, reactive to light.  Extraocular movements intact with no nystagmus. Visual fields full. Facial sensation intact. No facial asymmetry. Tongue, uvula, palate midline.  Motor: Bulk and tone normal, no cogwheeling, muscle strength 5/5 throughout with no pronator drift.  Sensation to light touch intact.  No extinction to double simultaneous stimulation.  Deep tendon reflexes 2+ throughout, toes downgoing.  Finger to nose testing intact.  Gait narrow-based and steady, able to tandem walk adequately.  Romberg negative. No resting tremor, +postural and action tremor.  IMPRESSION: This is a 77 yo RH man with a history of  hypertension, paroxysmal atrial fibrillation, Barrett esophagus, GERD, tremor, with mild cognitive impairment. MOCA score today 22/30 (23/30 in July 2018). He is taking Aricept 10mg  daily without side effects. His main  concern is the frustration he is experiencing due to his cognitive changes. He is again noted to get flustered during [facility] testing, stating he did not want to embarrass himself. We discussed his symptoms concerning for anxiety, and how anxiety can cause further worsening of memory. He is opposed to seeing a psychiatrist, but agreed to see a therapist to help him with coping strategies. We again discussed the importance of control of vascular risk factors, physical exercise, and brain stimulation exercises for brain health. He reports tremor is unchanged, continue Propronolol. He will follow-up in 6 months  and knows to call for any changes.   Thank you for allowing me to participate in his care.  Please do not hesitate to call for any questions or concerns.  The duration of this appointment visit was 25 minutes of face-to-face time with the patient.  Greater than 50% of this time was spent in counseling, explanation of diagnosis, planning of further management, and coordination of care.   Ellouise Newer, M.D.   CC: Dr. Yong Channel

## 2017-03-16 ENCOUNTER — Other Ambulatory Visit: Payer: Self-pay | Admitting: Family Medicine

## 2017-03-19 ENCOUNTER — Ambulatory Visit (INDEPENDENT_AMBULATORY_CARE_PROVIDER_SITE_OTHER): Payer: Medicare Other | Admitting: Family Medicine

## 2017-03-19 ENCOUNTER — Encounter: Payer: Self-pay | Admitting: Family Medicine

## 2017-03-19 VITALS — BP 124/78 | HR 62 | Temp 98.5°F | Ht 70.0 in | Wt 160.6 lb

## 2017-03-19 DIAGNOSIS — J0100 Acute maxillary sinusitis, unspecified: Secondary | ICD-10-CM

## 2017-03-19 MED ORDER — IPRATROPIUM BROMIDE 0.06 % NA SOLN: 2.0000 | Freq: Four times a day (QID) | NASAL | 0 refills | Status: DC

## 2017-03-19 NOTE — Progress Notes (Signed)
PCP: Larry Olp, MD  Subjective:  Larry Mejia is a 77 y.o. year old very pleasant male patient who presents with sinusitis symptoms including nasal congestion, sinus tenderness -other symptoms include: mild cough, has sore throat. Drainage is mainly clear but starting to get some yellow and very heavy particularly from left side. Some left ear pressure -day of illness:3 -Symptoms are worsening slightly -previous treatments: coricidin HBP -sick contacts/travel/risks: denies flu exposure or other sick contacts  ROS-denies fever, SOB, NVD, tooth pain  Pertinent Past Medical History-  Patient Active Problem List   Diagnosis Date Noted  . Vitamin B 12 deficiency 03/29/2016    Priority: High  . Memory loss 03/06/2016    Priority: High  . Paroxysmal atrial fibrillation (HCC)     Priority: High  . History of supraventricular tachycardia     Priority: Medium  . HTN (hypertension)     Priority: Medium  . Essential tremor     Priority: Medium  . Melanoma of skin ([place]) 05/31/2008    Priority: Medium  . GERD 07/30/2007    Priority: Medium  . BARRETTS ESOPHAGUS 07/30/2007    Priority: Medium  . Allergic rhinitis 02/09/2014    Priority: Low  . Insomnia 02/09/2014    Priority: Low  . Mitral regurgitation     Priority: Low  . Aortic insufficiency     Priority: Low  . RBBB (right bundle branch block)     Priority: Low  . NEPHROLITHIASIS, HX OF 05/31/2008    Priority: Low  . BPH (benign prostatic hyperplasia) 05/31/2008    Priority: Low  . Senile purpura ([place]) 03/06/2017  . Voiding difficulty 03/06/2017    Medications- reviewed  Current Outpatient Medications  Medication Sig Dispense Refill  . acetaminophen (TYLENOL) 325 MG tablet Take 650 mg by mouth every 6 (six) hours as needed.      . donepezil (ARICEPT) 10 MG tablet Take 1 tablet daily 30 tablet 11  . esomeprazole (NEXIUM) 40 MG capsule Take 40 mg by mouth 2 (two) times daily.     Marland Kitchen loratadine (CLARITIN) 10 MG  tablet Take 10 mg by mouth as needed.     . propranolol (INDERAL) 20 MG tablet TAKE 1 TABLET TWICE DAILY. 180 tablet 0  . propranolol (INDERAL) 20 MG tablet TAKE 1 TABLET BY MOUTH TWICE DAILY. 180 tablet 0   Current Facility-Administered Medications  Medication Dose Route Frequency Provider Last Rate Last Dose  . cyanocobalamin ((VITAMIN B-12)) injection 1,000 mcg  1,000 mcg Intramuscular Q30 days Larry Olp, MD   1,000 mcg at 01/20/17 0940    Objective: BP 124/78 (BP Location: Left Arm, Patient Position: Sitting, Cuff Size: Large)   Pulse 62   Temp 98.5 F (36.9 C) (Oral)   Ht 5\' 10"  (1.778 m)   Wt 160 lb 9.6 oz (72.8 kg)   SpO2 97%   BMI 23.04 kg/m  Gen: NAD, resting comfortably HEENT: Turbinates erythematous with mainly clear but slight yellow drainage, TM normal, pharynx mildly erythematous with no tonsilar exudate or edema, maxillary sinus tenderness CV: RRR no murmurs rubs or gallops Lungs: CTAB no crackles, wheeze, rhonchi Ext: no edema Skin: warm, dry, no rash  Assessment/Plan:  Sinsusitis Viral based on <10 days, no double sickening (symptoms improve then worse or worsen after about a week of symptoms), lack of severity of symptoms in first 3 days (fever and thick green drainage.  Treatment: -considered steroid: I opted out for now but we could consider prednisone if  not starting to improve by Friday -other symptomatic care with mucinex. Will also try ipratropium nasal spray up to 4x a day -Antibiotic indicated: no- but I agreed to call in augmentin antibiotic for [name] if symptoms last to next Tuesday or improve then worsen  Finally, we reviewed reasons to return to care including if symptoms worsen or persist or new concerns arise (particularly fever or shortness of breath)  Meds ordered this encounter  Medications  . ipratropium (ATROVENT) 0.06 % nasal spray    Sig: Place 2 sprays into both nostrils 4 (four) times daily.    Dispense:  15 mL    Refill:   0    Larry Reddish, MD

## 2017-03-19 NOTE — Patient Instructions (Addendum)
Sinsusitis (suspect this over common cold due to sinus pressure and amount of nasal discharge) Viral based on <10 days, no double sickening (symptoms improve then worse or worsen after about a week of symptoms), lack of severity of symptoms in first 3 days (fever and thick green drainage.  Treatment: -considered steroid: I opted out for now but we could consider prednisone if not starting to improve by Friday -other symptomatic care with mucinex. Will also try ipratropium nasal spray up to 4x a day -Antibiotic indicated: no- but I agreed to call in augmentin antibiotic for Larry Mejia if symptoms last to next Tuesday or improve then worsen  Finally, we reviewed reasons to return to care including if symptoms worsen or persist or new concerns arise (particularly fever or shortness of breath)

## 2017-03-21 ENCOUNTER — Encounter: Payer: Self-pay | Admitting: Family Medicine

## 2017-03-24 ENCOUNTER — Ambulatory Visit (INDEPENDENT_AMBULATORY_CARE_PROVIDER_SITE_OTHER): Payer: Medicare Other

## 2017-03-24 DIAGNOSIS — E538 Deficiency of other specified B group vitamins: Secondary | ICD-10-CM | POA: Diagnosis not present

## 2017-03-24 MED ORDER — CYANOCOBALAMIN 1000 MCG/ML IJ SOLN
1000.0000 ug | Freq: Once | INTRAMUSCULAR | Status: AC
  Administered 2017-03-24: 1000 ug via INTRAMUSCULAR

## 2017-03-24 NOTE — Progress Notes (Signed)
Patient in today and received injection in left arm.Tolerated well.

## 2017-04-24 ENCOUNTER — Ambulatory Visit (INDEPENDENT_AMBULATORY_CARE_PROVIDER_SITE_OTHER): Payer: Medicare Other

## 2017-04-24 DIAGNOSIS — E538 Deficiency of other specified B group vitamins: Secondary | ICD-10-CM

## 2017-04-24 MED ORDER — CYANOCOBALAMIN 1000 MCG/ML IJ SOLN
1000.0000 ug | Freq: Once | INTRAMUSCULAR | Status: AC
  Administered 2017-04-24: 1000 ug via INTRAMUSCULAR

## 2017-04-24 NOTE — Progress Notes (Signed)
Patient in today for B12 injection due to B12 deficiency. Injection administered in right arm and patient tolerated well.

## 2017-04-25 DIAGNOSIS — H26492 Other secondary cataract, left eye: Secondary | ICD-10-CM | POA: Diagnosis not present

## 2017-04-25 DIAGNOSIS — Z961 Presence of intraocular lens: Secondary | ICD-10-CM | POA: Diagnosis not present

## 2017-04-25 DIAGNOSIS — H52223 Regular astigmatism, bilateral: Secondary | ICD-10-CM | POA: Diagnosis not present

## 2017-04-25 DIAGNOSIS — H5203 Hypermetropia, bilateral: Secondary | ICD-10-CM | POA: Diagnosis not present

## 2017-04-30 NOTE — Progress Notes (Signed)
I have reviewed the patient's encounter and agree with the documentation.  Algis Greenhouse. Jerline Pain, MD 04/30/2017 11:59 AM

## 2017-05-22 ENCOUNTER — Ambulatory Visit (INDEPENDENT_AMBULATORY_CARE_PROVIDER_SITE_OTHER): Payer: Medicare Other | Admitting: Surgical

## 2017-05-22 DIAGNOSIS — E538 Deficiency of other specified B group vitamins: Secondary | ICD-10-CM | POA: Diagnosis not present

## 2017-05-22 MED ORDER — CYANOCOBALAMIN 1000 MCG/ML IJ SOLN
1000.0000 ug | Freq: Once | INTRAMUSCULAR | Status: AC
  Administered 2017-05-22: 1000 ug via INTRAMUSCULAR

## 2017-05-22 NOTE — Progress Notes (Signed)
Patient in today for his monthly B 12 injection. Injection given in left deltoid. Patient tolerated injection well. He will schedule B 12 injection for next month.

## 2017-05-27 DIAGNOSIS — H02839 Dermatochalasis of unspecified eye, unspecified eyelid: Secondary | ICD-10-CM | POA: Diagnosis not present

## 2017-05-27 DIAGNOSIS — H26492 Other secondary cataract, left eye: Secondary | ICD-10-CM | POA: Diagnosis not present

## 2017-05-27 DIAGNOSIS — I10 Essential (primary) hypertension: Secondary | ICD-10-CM | POA: Diagnosis not present

## 2017-05-27 DIAGNOSIS — Z961 Presence of intraocular lens: Secondary | ICD-10-CM | POA: Diagnosis not present

## 2017-06-06 DIAGNOSIS — Z961 Presence of intraocular lens: Secondary | ICD-10-CM | POA: Diagnosis not present

## 2017-06-06 DIAGNOSIS — H52223 Regular astigmatism, bilateral: Secondary | ICD-10-CM | POA: Diagnosis not present

## 2017-06-06 DIAGNOSIS — H26492 Other secondary cataract, left eye: Secondary | ICD-10-CM | POA: Diagnosis not present

## 2017-06-06 DIAGNOSIS — H5203 Hypermetropia, bilateral: Secondary | ICD-10-CM | POA: Diagnosis not present

## 2017-06-20 ENCOUNTER — Ambulatory Visit (INDEPENDENT_AMBULATORY_CARE_PROVIDER_SITE_OTHER): Payer: Medicare Other

## 2017-06-20 DIAGNOSIS — Z23 Encounter for immunization: Secondary | ICD-10-CM | POA: Diagnosis not present

## 2017-06-20 DIAGNOSIS — E538 Deficiency of other specified B group vitamins: Secondary | ICD-10-CM

## 2017-06-20 MED ORDER — CYANOCOBALAMIN 1000 MCG/ML IJ SOLN
1000.0000 ug | Freq: Once | INTRAMUSCULAR | Status: AC
  Administered 2017-06-20: 1000 ug via INTRAMUSCULAR

## 2017-06-20 NOTE — Progress Notes (Signed)
Patient received vitamin B12 1000 mcg in right deltoid.  Tolerated without difficulty.  Scheduled for another B12 injection in one month.

## 2017-07-04 ENCOUNTER — Ambulatory Visit (INDEPENDENT_AMBULATORY_CARE_PROVIDER_SITE_OTHER): Payer: Medicare Other | Admitting: Family Medicine

## 2017-07-04 ENCOUNTER — Encounter: Payer: Self-pay | Admitting: Family Medicine

## 2017-07-04 VITALS — BP 116/76 | HR 52 | Temp 97.9°F | Ht 70.0 in | Wt 154.4 lb

## 2017-07-04 DIAGNOSIS — K2270 Barrett's esophagus without dysplasia: Secondary | ICD-10-CM

## 2017-07-04 DIAGNOSIS — I10 Essential (primary) hypertension: Secondary | ICD-10-CM | POA: Diagnosis not present

## 2017-07-04 DIAGNOSIS — I480 Paroxysmal atrial fibrillation: Secondary | ICD-10-CM

## 2017-07-04 DIAGNOSIS — K219 Gastro-esophageal reflux disease without esophagitis: Secondary | ICD-10-CM

## 2017-07-04 DIAGNOSIS — E538 Deficiency of other specified B group vitamins: Secondary | ICD-10-CM

## 2017-07-04 LAB — COMPREHENSIVE METABOLIC PANEL
ALK PHOS: 62 U/L (ref 39–117)
ALT: 10 U/L (ref 0–53)
AST: 13 U/L (ref 0–37)
Albumin: 4 g/dL (ref 3.5–5.2)
BILIRUBIN TOTAL: 0.5 mg/dL (ref 0.2–1.2)
BUN: 18 mg/dL (ref 6–23)
CALCIUM: 9.2 mg/dL (ref 8.4–10.5)
CHLORIDE: 105 meq/L (ref 96–112)
CO2: 29 mEq/L (ref 19–32)
CREATININE: 1.05 mg/dL (ref 0.40–1.50)
GFR: 72.76 mL/min (ref >60.00)
Glucose, Bld: 87 mg/dL (ref 70–99)
Potassium: 4.2 mEq/L (ref 3.5–5.1)
Sodium: 140 mEq/L (ref 135–145)
TOTAL PROTEIN: 6.4 g/dL (ref 6.0–8.3)

## 2017-07-04 LAB — CBC
HCT: 42.3 % (ref 39.0–52.0)
Hemoglobin: 14.2 g/dL (ref 13.0–17.0)
MCHC: 33.6 g/dL (ref 30.0–36.0)
MCV: 92.9 fl (ref 78.0–100.0)
PLATELETS: 166 10*3/uL (ref 150.0–400.0)
RBC: 4.56 Mil/uL (ref 4.22–5.81)
RDW: 14.3 % (ref 11.5–15.5)
WBC: 4.2 10*3/uL (ref 4.0–10.5)

## 2017-07-04 LAB — VITAMIN B12: VITAMIN B 12: 400 pg/mL (ref 211–911)

## 2017-07-04 LAB — LDL CHOLESTEROL, DIRECT: Direct LDL: 86 mg/dL

## 2017-07-04 MED ORDER — CYANOCOBALAMIN 1000 MCG/ML IJ SOLN: 1000.0000 ug | Freq: Once | INTRAMUSCULAR | Status: DC

## 2017-07-04 NOTE — Assessment & Plan Note (Signed)
S: Patient had last EGD at [facility] in June 2018.  No signs of Barrett's at that time.  He was encouraged to continue Nexium 40 mg twice daily he is concerned about memory loss related to this but we have agreed that benefits of preventing Barrett's outweigh the risks of medication. No reflux on this medication A/P: Continue current medications. Has endoscopy planned for next few months

## 2017-07-04 NOTE — Assessment & Plan Note (Addendum)
S: controlled on propranolol 20 mg in the morning and 20 mg in the evening.  Heart rate slightly low BP Readings from Last 3 Encounters:  07/04/17 116/76  03/19/17 124/78  03/11/17 136/70  A/P: We discussed blood pressure goal of <140/90. Continue current meds

## 2017-07-04 NOTE — Progress Notes (Signed)
Subjective:  Larry Mejia is a 77 y.o. year old very pleasant male patient who presents for/with See problem oriented charting ROS- No chest pain or shortness of breath. No headache or blurry vision.    Past Medical History-  Patient Active Problem List   Diagnosis Date Noted  . Vitamin B 12 deficiency 03/29/2016    Priority: High  . Memory loss 03/06/2016    Priority: High  . Paroxysmal atrial fibrillation (HCC)     Priority: High  . History of supraventricular tachycardia     Priority: Medium  . HTN (hypertension)     Priority: Medium  . Essential tremor     Priority: Medium  . Melanoma of skin ([place]) 05/31/2008    Priority: Medium  . GERD 07/30/2007    Priority: Medium  . BARRETTS ESOPHAGUS 07/30/2007    Priority: Medium  . Allergic rhinitis 02/09/2014    Priority: Low  . Insomnia 02/09/2014    Priority: Low  . Mitral regurgitation     Priority: Low  . Aortic insufficiency     Priority: Low  . RBBB (right bundle branch block)     Priority: Low  . NEPHROLITHIASIS, HX OF 05/31/2008    Priority: Low  . BPH (benign prostatic hyperplasia) 05/31/2008    Priority: Low  . Senile purpura ([place]) 03/06/2017  . Voiding difficulty 03/06/2017    Medications- reviewed and updated Current Outpatient Medications  Medication Sig Dispense Refill  . acetaminophen (TYLENOL) 325 MG tablet Take 650 mg by mouth every 6 (six) hours as needed.      . donepezil (ARICEPT) 10 MG tablet Take 1 tablet daily 30 tablet 11  . esomeprazole (NEXIUM) 40 MG capsule Take 40 mg by mouth 2 (two) times daily.     Marland Kitchen loratadine (CLARITIN) 10 MG tablet Take 10 mg by mouth as needed.     . propranolol (INDERAL) 20 MG tablet TAKE 1 TABLET TWICE DAILY. (Patient taking differently: TAKE 1 HALF TABLET TWICE DAILY.) 180 tablet 0   Current Facility-Administered Medications  Medication Dose Route Frequency Provider Last Rate Last Dose  . cyanocobalamin ((VITAMIN B-12)) injection 1,000 mcg  1,000 mcg  Intramuscular Q30 days Marin Olp, MD   1,000 mcg at 01/20/17 0940    Objective: BP 116/76 (BP Location: Left Arm, Patient Position: Sitting, Cuff Size: Normal)   Pulse (!) 52   Temp 97.9 F (36.6 C) (Oral)   Ht 5' 10"  (1.778 m)   Wt 154 lb 6.4 oz (70 kg)   SpO2 98%   BMI 22.15 kg/m  Gen: NAD, resting comfortably CV: RRR no murmurs rubs or gallops Lungs: CTAB no crackles, wheeze, rhonchi Abdomen: soft/nontender/nondistended/normal bowel sounds.   Ext: no edema Skin: warm, dry Neuro: Some memory issues.  Normal speech Psych: Some anxiety  Assessment/Plan:  Other notes: 1.  Denies palpitations.  History of SVT years ago.  Last saw Dr. Ron Mejia in 2012 2. Small volume of urination not worsening.  3.  Following with Dr. Delice Mejia for memory loss-on Aricept 10 mg.  Has follow-up appointment in July. He has not met with behavioral health yet. He doesn't feel safe [place] on [place]. Drives locally 2 lane roads if drives at all. This is placing stress on wife.  4. havent checked lipids in years- will get a direct LDL today. Not fasting for full lipid panel  HTN (hypertension) S: controlled on propranolol 20 mg in the morning and 20 mg in the evening.  Heart rate slightly low  BP Readings from Last 3 Encounters:  07/04/17 116/76  03/19/17 124/78  03/11/17 136/70  A/P: We discussed blood pressure goal of <140/90. Continue current meds  BARRETTS ESOPHAGUS S: Patient had last EGD at [facility] in June 2018.  No signs of Barrett's at that time.  He was encouraged to continue Nexium 40 mg twice daily he is concerned about memory loss related to this but we have agreed that benefits of preventing Barrett's outweigh the risks of medication. No reflux on this medication A/P: Continue current medications. Has endoscopy planned for next few months  GERD Continue current medications. No reflux  Paroxysmal atrial fibrillation S: No obvious recurrence recently.  He cannot be on aspirin due to  Barrett's.  He declines Noac or Coumadin.  On propranolol if needed for rate control A/P: continue current medication  Vitamin B 12 deficiency Continues monthly injections for b12 Lab Results  Component Value Date   [id number] 580 09/03/2016  Update b12 today  Future Appointments  Date Time Provider [place]  07/18/2017  9:30 AM [facility] NURSE [facility]  09/12/2017  3:30 PM Larry Sprang, MD [facility] None   Return in about 6 months (around 01/04/2018) for follow up- or sooner if needed.  Lab/Order associations: Vitamin B 12 deficiency - Plan: Vitamin B12  Essential hypertension - Plan: LDL cholesterol, direct, CBC, Comprehensive metabolic panel  Barrett's esophagus without dysplasia  Gastroesophageal reflux disease, esophagitis presence not specified  Paroxysmal atrial fibrillation ([place])  Return precautions advised.  Larry Reddish, MD

## 2017-07-04 NOTE — Assessment & Plan Note (Signed)
S: No obvious recurrence recently.  He cannot be on aspirin due to Barrett's.  He declines Noac or Coumadin.  On propranolol if needed for rate control A/P: continue current medication

## 2017-07-04 NOTE — Assessment & Plan Note (Signed)
Continue current medications. No reflux

## 2017-07-04 NOTE — Assessment & Plan Note (Signed)
Continues monthly injections for b12 Lab Results  Component Value Date   VITAMINB12 322 09/03/2016  Update b12 today

## 2017-07-04 NOTE — Patient Instructions (Addendum)
Continue monthly injectoins- you are already scheduled for may 24th   I would encourage you to follow through with counseling in regards to stressor of memory changing.   Please stop by lab before you go

## 2017-07-04 NOTE — Progress Notes (Signed)
Your CBC was normal (blood counts, infection fighting cells, platelets). Your CMET was normal (kidney, liver, and electrolytes, blood sugar).  Your cholesterol Looks great! Your  B12 level is in a healthy range at 400-f ar better than the 71 from January 2018

## 2017-07-08 DIAGNOSIS — L821 Other seborrheic keratosis: Secondary | ICD-10-CM | POA: Diagnosis not present

## 2017-07-08 DIAGNOSIS — Z85828 Personal history of other malignant neoplasm of skin: Secondary | ICD-10-CM | POA: Diagnosis not present

## 2017-07-08 DIAGNOSIS — L570 Actinic keratosis: Secondary | ICD-10-CM | POA: Diagnosis not present

## 2017-07-08 DIAGNOSIS — R21 Rash and other nonspecific skin eruption: Secondary | ICD-10-CM | POA: Diagnosis not present

## 2017-07-08 DIAGNOSIS — D1801 Hemangioma of skin and subcutaneous tissue: Secondary | ICD-10-CM | POA: Diagnosis not present

## 2017-07-08 DIAGNOSIS — Z85820 Personal history of malignant melanoma of skin: Secondary | ICD-10-CM | POA: Diagnosis not present

## 2017-07-08 DIAGNOSIS — L814 Other melanin hyperpigmentation: Secondary | ICD-10-CM | POA: Diagnosis not present

## 2017-07-18 ENCOUNTER — Ambulatory Visit (INDEPENDENT_AMBULATORY_CARE_PROVIDER_SITE_OTHER): Payer: Medicare Other

## 2017-07-18 DIAGNOSIS — E538 Deficiency of other specified B group vitamins: Secondary | ICD-10-CM | POA: Diagnosis not present

## 2017-07-18 MED ORDER — CYANOCOBALAMIN 1000 MCG/ML IJ SOLN
1000.0000 ug | Freq: Once | INTRAMUSCULAR | Status: AC
  Administered 2017-07-18: 1000 ug via INTRAMUSCULAR

## 2017-07-18 NOTE — Progress Notes (Signed)
Patient received vitamin B12 1000 mcg in left deltoid.  Tolerated without difficulty.  Will schedule vitamin B12 injection in 1 month.

## 2017-07-23 DIAGNOSIS — L309 Dermatitis, unspecified: Secondary | ICD-10-CM | POA: Diagnosis not present

## 2017-07-31 DIAGNOSIS — D485 Neoplasm of uncertain behavior of skin: Secondary | ICD-10-CM | POA: Diagnosis not present

## 2017-07-31 DIAGNOSIS — R21 Rash and other nonspecific skin eruption: Secondary | ICD-10-CM | POA: Diagnosis not present

## 2017-08-01 DIAGNOSIS — L308 Other specified dermatitis: Secondary | ICD-10-CM | POA: Diagnosis not present

## 2017-08-06 DIAGNOSIS — L308 Other specified dermatitis: Secondary | ICD-10-CM | POA: Diagnosis not present

## 2017-08-07 ENCOUNTER — Encounter: Payer: Self-pay | Admitting: Family Medicine

## 2017-08-07 DIAGNOSIS — K2950 Unspecified chronic gastritis without bleeding: Secondary | ICD-10-CM | POA: Diagnosis not present

## 2017-08-07 DIAGNOSIS — Z85820 Personal history of malignant melanoma of skin: Secondary | ICD-10-CM | POA: Diagnosis not present

## 2017-08-07 DIAGNOSIS — K449 Diaphragmatic hernia without obstruction or gangrene: Secondary | ICD-10-CM | POA: Diagnosis not present

## 2017-08-07 DIAGNOSIS — Z87891 Personal history of nicotine dependence: Secondary | ICD-10-CM | POA: Diagnosis not present

## 2017-08-07 DIAGNOSIS — K2270 Barrett's esophagus without dysplasia: Secondary | ICD-10-CM | POA: Diagnosis not present

## 2017-08-07 DIAGNOSIS — K219 Gastro-esophageal reflux disease without esophagitis: Secondary | ICD-10-CM | POA: Diagnosis not present

## 2017-08-07 DIAGNOSIS — Z79899 Other long term (current) drug therapy: Secondary | ICD-10-CM | POA: Diagnosis not present

## 2017-08-07 DIAGNOSIS — Z8719 Personal history of other diseases of the digestive system: Secondary | ICD-10-CM | POA: Diagnosis not present

## 2017-08-07 DIAGNOSIS — K228 Other specified diseases of esophagus: Secondary | ICD-10-CM | POA: Diagnosis not present

## 2017-08-07 DIAGNOSIS — G250 Essential tremor: Secondary | ICD-10-CM | POA: Diagnosis not present

## 2017-08-07 DIAGNOSIS — Z87442 Personal history of urinary calculi: Secondary | ICD-10-CM | POA: Diagnosis not present

## 2017-08-07 DIAGNOSIS — Z09 Encounter for follow-up examination after completed treatment for conditions other than malignant neoplasm: Secondary | ICD-10-CM | POA: Diagnosis not present

## 2017-08-18 ENCOUNTER — Ambulatory Visit (INDEPENDENT_AMBULATORY_CARE_PROVIDER_SITE_OTHER): Payer: Medicare Other

## 2017-08-18 DIAGNOSIS — E538 Deficiency of other specified B group vitamins: Secondary | ICD-10-CM

## 2017-08-18 MED ORDER — CYANOCOBALAMIN 1000 MCG/ML IJ SOLN
1000.0000 ug | Freq: Once | INTRAMUSCULAR | Status: AC
  Administered 2017-08-18: 1000 ug via INTRAMUSCULAR

## 2017-08-18 NOTE — Progress Notes (Signed)
Patient in today for B12 injection due to B12 deficiency. Administered in right arm. Patient tolerated well. 

## 2017-09-09 ENCOUNTER — Ambulatory Visit: Payer: Medicare Other | Admitting: Neurology

## 2017-09-12 ENCOUNTER — Ambulatory Visit (INDEPENDENT_AMBULATORY_CARE_PROVIDER_SITE_OTHER): Payer: Medicare Other | Admitting: Neurology

## 2017-09-12 ENCOUNTER — Other Ambulatory Visit: Payer: Self-pay

## 2017-09-12 ENCOUNTER — Other Ambulatory Visit: Payer: Self-pay | Admitting: Family Medicine

## 2017-09-12 ENCOUNTER — Encounter: Payer: Self-pay | Admitting: Neurology

## 2017-09-12 VITALS — BP 136/78 | HR 57 | Ht 70.0 in | Wt 152.0 lb

## 2017-09-12 DIAGNOSIS — G250 Essential tremor: Secondary | ICD-10-CM

## 2017-09-12 DIAGNOSIS — F419 Anxiety disorder, unspecified: Secondary | ICD-10-CM

## 2017-09-12 DIAGNOSIS — F0390 Unspecified dementia without behavioral disturbance: Secondary | ICD-10-CM

## 2017-09-12 DIAGNOSIS — F03A0 Unspecified dementia, mild, without behavioral disturbance, psychotic disturbance, mood disturbance, and anxiety: Secondary | ICD-10-CM

## 2017-09-12 NOTE — Patient Instructions (Addendum)
1. Recommend increasing Propranolol 20mg  to 1 tablet twice a day 2. Recommend proceeding with individual therapy to help with developing coping strategies to reduce frustration 3. Continue Donepezil 10mg  daily 4. Follow-up in 6 months, call for any changes  We have sent a referral to [facility] for Therapy.  Please call [phone] to schedule your first appointment.     RECOMMENDATIONS FOR ALL PATIENTS WITH MEMORY PROBLEMS: 1. Continue to exercise (Recommend 30 minutes of walking everyday, or 3 hours every week) 2. Increase social interactions - continue going to [place] and enjoy social gatherings with friends and family 3. Eat healthy, avoid fried foods and eat more fruits and vegetables 4. Maintain adequate blood pressure, blood sugar, and blood cholesterol level. Reducing the risk of stroke and cardiovascular disease also helps promoting better memory. 5. Avoid stressful situations. Live a simple life and avoid aggravations. Organize your time and prepare for the next day in anticipation. 6. Sleep well, avoid any interruptions of sleep and avoid any distractions in the bedroom that may interfere with adequate sleep quality 7. Avoid sugar, avoid sweets as there is a strong link between excessive sugar intake, diabetes, and cognitive impairment The Mediterranean diet has been shown to help patients reduce the risk of progressive memory disorders and reduces cardiovascular risk. This includes eating fish, eat fruits and green leafy vegetables, nuts like almonds and hazelnuts, walnuts, and also use olive oil. Avoid fast foods and fried foods as much as possible. Avoid sweets and sugar as sugar use has been linked to worsening of memory function.

## 2017-09-12 NOTE — Progress Notes (Signed)
NEUROLOGY FOLLOW UP OFFICE NOTE  Larry Mejia [id number] 08-29-1940  HISTORY OF PRESENT ILLNESS: I had the pleasure of seeing Larry Mejia in follow-up in the neurology clinic on 09/12/2017.  The patient was last seen on 6 months ago for worsening memory. MOCA score in January 2019 was 22/30 (23/30 in July 2018). He is again accompanied by his wife who helps supplement the history today. He is taking Aricept 10mg  daily without side effects. His concerns today are very similar to his last visit, he having a "higher level of flustration." He gets very irritable because he would forget halfway through a thought process or would say a different word from what he meant. He had to get out of 3 organizations where he was an active member because they required a lot of brain work. He does not drive any more because he would not remember streets. He was not getting lost. His wife manages his medications. He denies any headaches, dizziness, diplopia, focal numbness/tingling/weakness, no falls. With the increase in "flustration," his tremor has worsened as well, he has more difficulties writing. He is taking Propranolol 10mg  BID without side effects.  HPI 09/04/2016: This is a 77 yo RH man with a history of hypertension, paroxysmal atrial fibrillation, Barrett esophagus, GERD, tremor, with mild cognitive impairment. He feels frustrated because he knows something but "can't get the right key." He searches for answers when asked questions. He would walk into a room and stop and think what he went to get. It comes to him in a few seconds. He denies getting lost driving, but now has to pull up a picture if he is given instructions. His wife noticed this worsen in the past year. He likes to tinker with things and knows how to fit pieces together, but describes the sensation as "like somebody else if using that file before I could use it." He denies missing medications. He always loses his sunglasses or keys. His wife  is in charge of finances. His wife has noticed more irritation if she tries to help him or asks him a follow-up question to help him. She has expressed some concern about driving and states she has taken over some of it. He had a workup for memory loss with his PCP, and was found to have a low B12 level of 71 last January 2018. TSH normal. I personally reviewed MRI brain with and without contrast which did not show any acute changes. There was generalized volume loss, mild to moderate chronic microvascular disease. He has been on B12 injections since then and has not noticed much change in cognition. Most recent B12 level yesterday was 322. He is also concerned about long-term Nexium use contributing/causing memory issues. He has had tremors in both hands L>R for the past year, more with action but it does not affect eating or writing. Propranolol may be helping some. It only bothers him when he picks up a glass and moves it horizontally, he denies dropping things. He denies any headaches, dizziness, diplopia, dysarthria/dysphagia, neck/back pain, focal numbness/tingling/weakness, bowel/bladder dysfunction, anosmia. His mother and paternal uncle had memory issues. He denies any significant head injuries. He drinks 1 glass of wine a day. They deny any difficulties with ADLs, no hallucinations or paranoia  PAST MEDICAL HISTORY: Past Medical History:  Diagnosis Date  . Aortic insufficiency     mild...echo.Marland KitchenMarland Kitchen3/2007....Marland KitchenMarland KitchenAortic root...upper normal ...echo.Marland KitchenMarland Kitchen3/2007  . Barrett esophagus   . BPH (benign prostatic hypertrophy)   . Chest pain  May, 2012  . Colonic polyp   . Ejection fraction    55-60% EF, echo, 2007  . GERD (gastroesophageal reflux disease)   . History of colonic polyps 10/13/2006   No polyps 2015 age 40-no further colonscopies    . HTN (hypertension)   . Malignant melanoma of other specified sites of skin   . Mitral regurgitation    Mild, echo, 2007  . Nephrolithiasis   . Paroxysmal  atrial fibrillation (HCC)    One episode remotely  . RBBB (right bundle branch block)   . SVT (supraventricular tachycardia) ([facility])   . Tremor    Essential tremor    MEDICATIONS: Current Outpatient Medications on File Prior to Visit  Medication Sig Dispense Refill  . acetaminophen (TYLENOL) 325 MG tablet Take 650 mg by mouth every 6 (six) hours as needed.      . donepezil (ARICEPT) 10 MG tablet Take 1 tablet daily 30 tablet 11  . esomeprazole (NEXIUM) 40 MG capsule Take 40 mg by mouth 2 (two) times daily.     Marland Kitchen loratadine (CLARITIN) 10 MG tablet Take 10 mg by mouth as needed.     . propranolol (INDERAL) 20 MG tablet TAKE 1 TABLET TWICE DAILY. (Patient taking differently: TAKE 1 HALF TABLET TWICE DAILY.) 180 tablet 0   Current Facility-Administered Medications on File Prior to Visit  Medication Dose Route Frequency Provider Last Rate Last Dose  . cyanocobalamin ((VITAMIN B-12)) injection 1,000 mcg  1,000 mcg Intramuscular Q30 days Marin Olp, MD   1,000 mcg at 01/20/17 0940    ALLERGIES: Allergies  Allergen Reactions  . Iohexol     Contrast dye causes flushing and tingling    FAMILY HISTORY: Family History  Problem Relation Age of Onset  . Stroke Father 50  . Colon cancer Father 25    SOCIAL HISTORY: Social History   Socioeconomic History  . Marital status: Married    Spouse name: Not on file  . Number of children: Not on file  . Years of education: Not on file  . Highest education level: Not on file  Occupational History  . Not on file  Social Needs  . Financial resource strain: Not on file  . Food insecurity:    Worry: Not on file    Inability: Not on file  . Transportation needs:    Medical: Not on file    Non-medical: Not on file  Tobacco Use  . Smoking status: Former Smoker    Packs/day: 0.25    Years: 6.00    Pack years: 1.50    Types: Cigarettes    Last attempt to quit: 02/25/1973    Years since quitting: 44.5  . Smokeless tobacco: Never Used    Substance and Sexual Activity  . Alcohol use: Yes    Comment: wine or scotch at night  . Drug use: No  . Sexual activity: Not on file  Lifestyle  . Physical activity:    Days per week: Not on file    Minutes per session: Not on file  . Stress: Not on file  Relationships  . Social connections:    Talks on phone: Not on file    Gets together: Not on file    Attends religious service: Not on file    Active member of club or organization: Not on file    Attends meetings of clubs or organizations: Not on file    Relationship status: Not on file  . Intimate partner violence:  Fear of current or ex partner: Not on file    Emotionally abused: Not on file    Physically abused: Not on file    Forced sexual activity: Not on file  Other Topics Concern  . Not on file  Social History Narrative   Married 1963. 2 sons-1 with barrett's. Lost 1 grandkid at age 61, another 65 living in 58.    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       Hobbies[place]    REVIEW OF SYSTEMS: Constitutional: No fevers, chills, or sweats, no generalized fatigue, change in appetite Eyes: No visual changes, double vision, eye pain Ear, nose and throat: No hearing loss, ear pain, nasal congestion, sore throat Cardiovascular: No chest pain, palpitations Respiratory:  No shortness of breath at rest or with exertion, wheezes GastrointestinaI: No nausea, vomiting, diarrhea, abdominal pain, fecal incontinence Genitourinary:  No dysuria, urinary retention or frequency Musculoskeletal:  No neck pain, back pain Integumentary: No rash, pruritus, skin lesions Neurological: as above Psychiatric: No depression, insomnia, +anxiety Endocrine: No palpitations, fatigue, diaphoresis, mood swings, change in appetite, change in weight, increased thirst Hematologic/Lymphatic:  No anemia, purpura,  petechiae. Allergic/Immunologic: no itchy/runny eyes, nasal congestion, recent allergic reactions, rashes  PHYSICAL EXAM: Vitals:   09/12/17 1526  BP: 136/78  Pulse: (!) 57  SpO2: 98%   General: No acute distress, he becomes anxious again in the office, worse during MMSE testing Head:  Normocephalic/atraumatic Neck: supple, no paraspinal tenderness, full range of motion Heart:  Regular rate and rhythm Lungs:  Clear to auscultation bilaterally Back: No paraspinal tenderness Skin/Extremities: No rash, no edema Neurological Exam: alert and oriented to person, place, month/day of week. He gets confused during questioning, saying per calendar, it is the Fall. No aphasia or dysarthria. Fund of knowledge is appropriate.  Recent and remote memory are impaired.  Attention and concentration are normal.    Able to name objects and repeat phrases.  MMSE - Mini Mental State Exam 09/12/2017 09/20/2016  Not completed: - Refused  Orientation to time 2 -  Orientation to Place 5 -  Registration 3 -  Attention/ Calculation 4 -  Recall 0 -  Language- name 2 objects 2 -  Language- repeat 1 -  Language- follow 3 step command 2 -  Language- read & follow direction 1 -  Write a sentence 1 -  Copy design 1 -  Total score 22 -    Montreal Cognitive Assessment  03/11/2017 09/04/2016  Visuospatial/ Executive (0/5) 1 3  Naming (0/3) 3 3  Attention: Read list of digits (0/2) 2 2  Attention: Read list of letters (0/1) 1 1  Attention: Serial 7 subtraction starting at 100 (0/3) 2 2  Language: Repeat phrase (0/2) 2 2  Language : Fluency (0/1) 1 1  Abstraction (0/2) 2 2  Delayed Recall (0/5) 3 1  Orientation (0/6) 5 6  Total 22 23   Cranial nerves: Pupils equal, round, reactive to light.  Extraocular movements intact with no nystagmus. Visual fields full. Facial sensation intact. No facial asymmetry. Tongue, uvula, palate midline.  Motor: Bulk and tone normal, no cogwheeling, muscle strength 5/5 throughout  with no pronator drift.  Sensation to light touch intact.  No extinction to double simultaneous stimulation.  Deep tendon reflexes 2+ throughout, toes downgoing.  Finger to nose testing intact.  Gait narrow-based and steady, able to tandem  walk adequately.  Romberg negative. No resting tremor, +postural and action tremor with tremor worsening during writing, no micrographia (see attached)  IMPRESSION: This is a 77 yo RH man with a history of  hypertension, paroxysmal atrial fibrillation, Barrett esophagus, GERD, tremor, with worsening memory. MMSE today 22/30 (MOCA score 22/30 in January 2019, 23/30 in July 2018). He is having more confusion and difficulties with complex ADLs, symptoms suggestive of mild dementia. Continue Aricept 10mg  daily. We again discussed the level of frustration he is having, he repeatedly stated he does not want to do group therapy, but is agreeable to counseling to help with developing coping strategies. He is not interested in an SSRI. He reports worsening of tremor, increase Propranolol to 20mg  BID, this may help with anxiety as well. We again discussed the importance of control of vascular risk factors, physical exercise, and brain stimulation exercises for brain health. He will follow-up in 6 months and knows to call for any changes.   Thank you for allowing me to participate in his care.  Please do not hesitate to call for any questions or concerns.  The duration of this appointment visit was 30 minutes of face-to-face time with the patient.  Greater than 50% of this time was spent in counseling, explanation of diagnosis, planning of further management, and coordination of care.   Ellouise Newer, M.D.   CC: Dr. Yong Channel

## 2017-09-15 ENCOUNTER — Ambulatory Visit (INDEPENDENT_AMBULATORY_CARE_PROVIDER_SITE_OTHER): Payer: Medicare Other | Admitting: Family Medicine

## 2017-09-15 ENCOUNTER — Encounter: Payer: Self-pay | Admitting: Family Medicine

## 2017-09-15 VITALS — BP 134/74 | HR 49 | Temp 97.7°F | Ht 70.0 in | Wt 155.0 lb

## 2017-09-15 DIAGNOSIS — R1909 Other intra-abdominal and pelvic swelling, mass and lump: Secondary | ICD-10-CM | POA: Diagnosis not present

## 2017-09-15 NOTE — Patient Instructions (Signed)
It was very nice to see you today!  I think you have a hernia. We will check an ultrasound to evaluate this.   Please seek medical care if you have severe nausea, vomiting, or abdominal pain.  Take care, Dr Jerline Pain   Inguinal Hernia, Adult An inguinal hernia is when fat or the intestines push through the area where the leg meets the lower belly (groin) and make a rounded lump (bulge). This condition happens over time. There are three types of inguinal hernias. These types include:  Hernias that can be pushed back into the belly (are reducible).  Hernias that cannot be pushed back into the belly (are incarcerated).  Hernias that cannot be pushed back into the belly and lose their blood supply (get strangulated). This type needs emergency surgery.  Follow these instructions at home: Lifestyle  Drink enough fluid to keep your urine (pee) clear or pale yellow.  Eat plenty of fruits, vegetables, and whole grains. These have a lot of fiber. Talk with your doctor if you have questions.  Avoid lifting heavy objects.  Avoid standing for long periods of time.  Do not use tobacco products. These include cigarettes, chewing tobacco, or e-cigarettes. If you need help quitting, ask your doctor.  Try to stay at a healthy weight. General instructions  Do not try to force the hernia back in.  Watch your hernia for any changes in color or size. Let your doctor know if there are any changes.  Take over-the-counter and prescription medicines only as told by your doctor.  Keep all follow-up visits as told by your doctor. This is important. Contact a doctor if:  You have a fever.  You have new symptoms.  Your symptoms get worse. Get help right away if:  The area where the legs meets the lower belly has: ? Pain that gets worse suddenly. ? A bulge that gets bigger suddenly and does not go down. ? A bulge that turns red or purple. ? A bulge that is painful to the touch.  You are a man  and your scrotum: ? Suddenly feels painful. ? Suddenly changes in size.  You feel sick to your stomach (nauseous) and this feeling does not go away.  You throw up (vomit) and this keeps happening.  You feel your heart beating a lot more quickly than normal.  You cannot poop (have a bowel movement) or pass gas. This information is not intended to replace advice given to you by your health care provider. Make sure you discuss any questions you have with your health care provider. Document Released: 03/14/2006 Document Revised: 07/20/2015 Document Reviewed: 12/22/2013 Elsevier Interactive Patient Education  2018 [employer].

## 2017-09-15 NOTE — Progress Notes (Signed)
   Subjective:  Larry Mejia is a 77 y.o. male who presents today for same-day appointment with a chief complaint of abdominal bulge.   HPI:  Abdominal Bulge, Acute problem Started about a week ago. Gradually improving over the past few days. Recently was at a conference and was doing a lot of heavy lifting and thinks that exacerbated his symptoms.  He had a little pain which is improved.  Still some swelling to the area.  Took Tylenol which helped.  No nausea or vomiting.  No constipation or diarrhea.  ROS: Per HPI  PMH: He reports that he quit smoking about 44 years ago. His smoking use included cigarettes. He has a 1.50 pack-year smoking history. He has never used smokeless tobacco. He reports that he drinks alcohol. He reports that he does not use drugs.  Objective:  Physical Exam: BP 134/74 (BP Location: Left Arm, Patient Position: Sitting, Cuff Size: Normal)   Pulse (!) 49   Temp 97.7 F (36.5 C) (Oral)   Ht 5\' 10"  (1.778 m)   Wt 155 lb (70.3 kg)   SpO2 98%   BMI 22.24 kg/m   Gen: NAD, resting comfortably CV: RRR with no murmurs appreciated Pulm: NWOB, CTAB with no crackles, wheezes, or rhonchi GI: Normal bowel sounds present. Soft, Nontender, Nondistended. GU: Appreciable mass noted in left inguinal area.  Assessment/Plan:  Left inguinal mass Consistent with hernia.  Will obtain ultrasound to confirm diagnosis.  He will continue using over-the-counter analgesics as needed.  Discussed reasons to seek medical care including development of severe abdominal pain, nausea, and vomiting.  Discussed natural course of hernia and eventual need for surgical repair.  Algis Greenhouse. Jerline Pain, MD 09/15/2017 11:56 AM

## 2017-09-16 ENCOUNTER — Telehealth: Payer: Self-pay | Admitting: Neurology

## 2017-09-16 NOTE — Telephone Encounter (Signed)
Patient wife called and states that behavioral health could not see them due to no one takes their ins and would like to be referred to someone else

## 2017-09-16 NOTE — Telephone Encounter (Signed)
Called Behavioral health regarding patients therapy referral.  Right now none of there therapist have availability for a medicare patient.  They recommended [facility]-[phone], [facility]-[phone] or [facility]-[phone].  Informed patient of other options.

## 2017-09-17 DIAGNOSIS — L570 Actinic keratosis: Secondary | ICD-10-CM | POA: Diagnosis not present

## 2017-09-18 ENCOUNTER — Ambulatory Visit (INDEPENDENT_AMBULATORY_CARE_PROVIDER_SITE_OTHER): Payer: Medicare Other

## 2017-09-18 DIAGNOSIS — E538 Deficiency of other specified B group vitamins: Secondary | ICD-10-CM | POA: Diagnosis not present

## 2017-09-18 MED ORDER — CYANOCOBALAMIN 1000 MCG/ML IJ SOLN
1000.0000 ug | Freq: Once | INTRAMUSCULAR | Status: AC
  Administered 2017-09-18: 1000 ug via INTRAMUSCULAR

## 2017-09-18 NOTE — Progress Notes (Signed)
Patient in today for B12 injection. Administered in left arm. Patient tolerated well.

## 2017-09-19 ENCOUNTER — Ambulatory Visit
Admission: RE | Admit: 2017-09-19 | Discharge: 2017-09-19 | Disposition: A | Discharge status: Home / Self Care | Payer: Medicare Other | Source: Ambulatory Visit | Attending: Family Medicine | Admitting: Family Medicine

## 2017-09-19 ENCOUNTER — Encounter: Payer: Self-pay | Admitting: Neurology

## 2017-09-19 DIAGNOSIS — R1909 Other intra-abdominal and pelvic swelling, mass and lump: Secondary | ICD-10-CM

## 2017-09-19 DIAGNOSIS — K4090 Unilateral inguinal hernia, without obstruction or gangrene, not specified as recurrent: Secondary | ICD-10-CM | POA: Diagnosis not present

## 2017-09-22 ENCOUNTER — Ambulatory Visit: Payer: Medicare Other | Admitting: *Deleted

## 2017-09-23 NOTE — Progress Notes (Signed)
Please inform patient of the following:  His ultrasound confirmed that he has a hernia. I would recommend surgical evaluation if he wishes. Please place this referral if he is interested.  Algis Greenhouse. Jerline Pain, MD 09/23/2017 12:54 PM

## 2017-09-24 ENCOUNTER — Other Ambulatory Visit: Payer: Self-pay

## 2017-09-24 DIAGNOSIS — K4090 Unilateral inguinal hernia, without obstruction or gangrene, not specified as recurrent: Secondary | ICD-10-CM

## 2017-10-16 DIAGNOSIS — L249 Irritant contact dermatitis, unspecified cause: Secondary | ICD-10-CM | POA: Diagnosis not present

## 2017-10-22 ENCOUNTER — Ambulatory Visit (INDEPENDENT_AMBULATORY_CARE_PROVIDER_SITE_OTHER): Payer: Medicare Other

## 2017-10-22 DIAGNOSIS — K4090 Unilateral inguinal hernia, without obstruction or gangrene, not specified as recurrent: Secondary | ICD-10-CM | POA: Diagnosis not present

## 2017-10-22 DIAGNOSIS — E538 Deficiency of other specified B group vitamins: Secondary | ICD-10-CM

## 2017-10-22 MED ORDER — CYANOCOBALAMIN 1000 MCG/ML IJ SOLN
1000.0000 ug | Freq: Once | INTRAMUSCULAR | Status: AC
Start: 2017-10-22 — End: 2017-10-22
  Administered 2017-10-22: 1000 ug via INTRAMUSCULAR

## 2017-10-22 NOTE — Progress Notes (Signed)
Patient was here for B12 injection. Administered in Right deltoid by Merry Lofty, CMA. Patient tolerated well.

## 2017-11-25 ENCOUNTER — Ambulatory Visit (INDEPENDENT_AMBULATORY_CARE_PROVIDER_SITE_OTHER): Payer: Medicare Other

## 2017-11-25 DIAGNOSIS — Z23 Encounter for immunization: Secondary | ICD-10-CM | POA: Diagnosis not present

## 2017-11-25 DIAGNOSIS — E538 Deficiency of other specified B group vitamins: Secondary | ICD-10-CM | POA: Diagnosis not present

## 2017-11-25 MED ORDER — CYANOCOBALAMIN 1000 MCG/ML IJ SOLN
1000.0000 ug | Freq: Once | INTRAMUSCULAR | Status: AC
  Administered 2017-11-25: 1000 ug via INTRAMUSCULAR

## 2017-11-25 NOTE — Patient Instructions (Signed)
Health Maintenance Due  Topic Date Due  . INFLUENZA VACCINE  09/25/2017    Depression screen [facility] 2/9 09/20/2016 03/06/2016 08/16/2014  Decreased Interest 0 0 0  Down, Depressed, Hopeless 0 0 0  PHQ - 2 Score 0 0 0  Feeling bad or failure about yourself  - - -  Moving slowly or fidgety/restless - - -  Suicidal thoughts - - -

## 2017-11-25 NOTE — Progress Notes (Signed)
Patient in today for B12 injections. Administered in left arm. Tolerated well.  Administered Flu shot in right arm. VIS given. Tolerated well

## 2018-01-05 ENCOUNTER — Ambulatory Visit (INDEPENDENT_AMBULATORY_CARE_PROVIDER_SITE_OTHER): Payer: Medicare Other | Admitting: Family Medicine

## 2018-01-05 ENCOUNTER — Encounter: Payer: Self-pay | Admitting: Family Medicine

## 2018-01-05 DIAGNOSIS — R413 Other amnesia: Secondary | ICD-10-CM | POA: Diagnosis not present

## 2018-01-05 DIAGNOSIS — G250 Essential tremor: Secondary | ICD-10-CM

## 2018-01-05 DIAGNOSIS — E538 Deficiency of other specified B group vitamins: Secondary | ICD-10-CM

## 2018-01-05 NOTE — Assessment & Plan Note (Addendum)
Patient is compliant with Aricept but is really struggling with the effects of memory loss on his lifestyle.  He is only shared with family members.  Has been unable to share with friends yet.  We discussed counseling to help him with this.  I think this has also led him at least into a situational depression.  Elevated PHQ 9 today 20.  Patient did agree to consider counseling-seems strongly interested in meeting with Dr. Cheryln Manly- gave him a handout today and he and his wife will proceed with this except  Will not list as major depression yet-but if persists over the next 6 months or needs medication will certainly add this

## 2018-01-05 NOTE — Patient Instructions (Signed)
No changes today other than reaching out to Dr. Cheryln Manly to get set up for a counseling/therapy session. I really think this is going to benefit you

## 2018-01-05 NOTE — Assessment & Plan Note (Signed)
Monthly injections--> oral 11/2017.  We discussed updating B12 levels-they would like to wait until next visit/blood work

## 2018-01-05 NOTE — Progress Notes (Signed)
Subjective:  Larry Mejia is a 77 y.o. year old very pleasant male patient who presents for/with See problem oriented charting ROS-patient with some depressed mood, no SI.  No reported chest pain or shortness of breath.  Bilateral intention tremor  Past Medical History-  Patient Active Problem List   Diagnosis Date Noted  . Vitamin B 12 deficiency 03/29/2016    Priority: High  . Memory loss 03/06/2016    Priority: High  . Paroxysmal atrial fibrillation (HCC)     Priority: High  . History of supraventricular tachycardia     Priority: Medium  . HTN (hypertension)     Priority: Medium  . Essential tremor     Priority: Medium  . Melanoma of skin ([place]) 05/31/2008    Priority: Medium  . GERD 07/30/2007    Priority: Medium  . BARRETTS ESOPHAGUS 07/30/2007    Priority: Medium  . Allergic rhinitis 02/09/2014    Priority: Low  . Insomnia 02/09/2014    Priority: Low  . Mitral regurgitation     Priority: Low  . Aortic insufficiency     Priority: Low  . RBBB (right bundle branch block)     Priority: Low  . NEPHROLITHIASIS, HX OF 05/31/2008    Priority: Low  . BPH (benign prostatic hyperplasia) 05/31/2008    Priority: Low  . 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 Voiding difficulty 03/06/2017    Medications- reviewed and updated Current Outpatient Medications  Medication Sig Dispense Refill  . acetaminophen (TYLENOL) 325 MG tablet Take 650 mg by mouth every 6 (six) hours as needed.      . donepezil (ARICEPT) 10 MG tablet Take 1 tablet daily 30 tablet 11  . esomeprazole (NEXIUM) 40 MG capsule Take 40 mg by mouth 2 (two) times daily.     Marland Kitchen loratadine (CLARITIN) 10 MG tablet Take 10 mg by mouth as needed.     . propranolol (INDERAL) 20 MG tablet TAKE 1 TABLET BY MOUTH TWICE DAILY. 180 tablet 0   Current Facility-Administered Medications  Medication Dose Route Frequency Provider Last Rate Last Dose  . 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 Doesn't do technical things- just feels discouraged by not being able to do what he wants to do. Feels frustrated. He is concerned about embarrassing family or friends. Feels tremor is worse and trouble writing- increased propranolol per Dr. Delice Lesch - but apparently he had rash on bilateral hips on higher dose so he is back to propranolol 1/2 tablet of 20mg  in Am and half in PM. Wife states tremor at rest, not walking as confidently, less expression in face  Has upcoming allergy testing which will give [place] more information.   On b12 tablet now 1000 mcg a month for last month Lab Results  Component Value Date   VITAMINB12 400 07/04/2017  A/P: Memory loss Patient is compliant with Aricept but is really struggling with the effects of memory loss on his lifestyle.  He is only shared with family members.  Has been unable to share with friends yet.  We discussed counseling to help him with this.  I think this has also led him at least into a situational depression.  Elevated PHQ 9 today 20.  Patient did agree to consider counseling-seems strongly interested in meeting with Dr. Cheryln Manly- gave him a handout today and he and his wife will proceed with this except  Will not list as major depression yet-but if persists over the next 6 months or needs  medication will certainly add this  Vitamin B 12 deficiency Monthly injections--> oral 11/2017.  We discussed updating B12 levels-they would like to wait until next visit/blood work  Essential tremor Patient using 10 mg propranolol twice a day-would likely benefit him to use higher dose but he wants to get allergy testing first in case this is related to the skin rash he developed  Future Appointments  Date Time Provider [place]  02/16/2018  3:00 PM Oren Binet, PhD [facility] None  03/17/2018  9:00 AM Cameron Sprang, MD [facility] None  07/07/2018 11:00 AM Marin Olp, MD [facility]   Lab/Order associations: Memory loss  Vitamin B 12 deficiency  Essential tremor Return precautions advised.  Garret Reddish, MD

## 2018-01-05 NOTE — Assessment & Plan Note (Signed)
Patient using 10 mg propranolol twice a day-would likely benefit him to use higher dose but he wants to get allergy testing first in case this is related to the skin rash he developed

## 2018-01-19 DIAGNOSIS — R208 Other disturbances of skin sensation: Secondary | ICD-10-CM | POA: Diagnosis not present

## 2018-01-19 DIAGNOSIS — L820 Inflamed seborrheic keratosis: Secondary | ICD-10-CM | POA: Diagnosis not present

## 2018-01-19 DIAGNOSIS — Z85828 Personal history of other malignant neoplasm of skin: Secondary | ICD-10-CM | POA: Diagnosis not present

## 2018-01-19 DIAGNOSIS — L905 Scar conditions and fibrosis of skin: Secondary | ICD-10-CM | POA: Diagnosis not present

## 2018-01-21 ENCOUNTER — Encounter: Payer: Self-pay | Admitting: Neurology

## 2018-01-21 ENCOUNTER — Other Ambulatory Visit: Payer: Self-pay

## 2018-01-21 ENCOUNTER — Ambulatory Visit (INDEPENDENT_AMBULATORY_CARE_PROVIDER_SITE_OTHER): Payer: Medicare Other | Admitting: Neurology

## 2018-01-21 VITALS — BP 142/76 | HR 61 | Ht 68.0 in | Wt 153.0 lb

## 2018-01-21 DIAGNOSIS — F03B18 Unspecified dementia, moderate, with other behavioral disturbance: Secondary | ICD-10-CM

## 2018-01-21 DIAGNOSIS — F0391 Unspecified dementia with behavioral disturbance: Secondary | ICD-10-CM

## 2018-01-21 DIAGNOSIS — R443 Hallucinations, unspecified: Secondary | ICD-10-CM

## 2018-01-21 MED ORDER — ESCITALOPRAM OXALATE 10 MG PO TABS: 10.0000 mg | ORAL_TABLET | Freq: Every day | ORAL | 11 refills | Status: DC

## 2018-01-21 MED ORDER — DONEPEZIL HCL 10 MG PO TABS: ORAL_TABLET | ORAL | 3 refills | Status: DC

## 2018-01-21 NOTE — Patient Instructions (Addendum)
1. Schedule MRI brain with and without contrast  We have sent a referral to [facility] for your MRI and they will call you directly to schedule your appt. They are located at [place]. If you need to contact them directly please call [phone].   2. Start Lexapro 10mg : Take 1 tablet every night 3. Continue Aricept 10mg : Take 1 tablet every night 4. Proceed with seeing Dr. Cheryln Manly as scheduled 5. Follow-up in 4-5 months, call for any changes  FALL PRECAUTIONS: Be cautious when walking. Scan the area for obstacles that may increase the risk of trips and falls. When getting up in the mornings, sit up at the edge of the bed for a few minutes before getting out of bed. Consider elevating the bed at the head end to avoid drop of blood pressure when getting up. Walk always in a well-lit room (use night lights in the walls). Avoid area rugs or power cords from appliances in the middle of the walkways. Use a walker or a cane if necessary and consider physical therapy for balance exercise. Get your eyesight checked regularly.  HOME SAFETY: Consider the safety of the kitchen when operating appliances like stoves, microwave oven, and blender. Consider having supervision and share cooking responsibilities until no longer able to participate in those. Accidents with firearms and other hazards in the house should be identified and addressed as well.  ABILITY TO BE LEFT ALONE: If patient is unable to contact 911 operator, consider using LifeLine, or when the need is there, arrange for someone to stay with patients. Smoking is a fire hazard, consider supervision or cessation. Risk of wandering should be assessed by caregiver and if detected at any point, supervision and safe proof recommendations should be instituted.   RECOMMENDATIONS FOR ALL PATIENTS WITH MEMORY PROBLEMS: 1. Continue to exercise (Recommend 30 minutes of walking everyday, or 3 hours every week) 2. Increase social  interactions - continue going to [place] and enjoy social gatherings with friends and family 3. Eat healthy, avoid fried foods and eat more fruits and vegetables 4. Maintain adequate blood pressure, blood sugar, and blood cholesterol level. Reducing the risk of stroke and cardiovascular disease also helps promoting better memory. 5. Avoid stressful situations. Live a simple life and avoid aggravations. Organize your time and prepare for the next day in anticipation. 6. Sleep well, avoid any interruptions of sleep and avoid any distractions in the bedroom that may interfere with adequate sleep quality 7. Avoid sugar, avoid sweets as there is a strong link between excessive sugar intake, diabetes, and cognitive impairment The Mediterranean diet has been shown to help patients reduce the risk of progressive memory disorders and reduces cardiovascular risk. This includes eating fish, eat fruits and green leafy vegetables, nuts like almonds and hazelnuts, walnuts, and also use olive oil. Avoid fast foods and fried foods as much as possible. Avoid sweets and sugar as sugar use has been linked to worsening of memory function.  There is always a concern of gradual progression of memory problems. If this is the case, then we may need to adjust level of care according to patient needs. Support, both to the patient and caregiver, should then be put into place.

## 2018-01-21 NOTE — Progress Notes (Signed)
NEUROLOGY FOLLOW UP OFFICE NOTE  Larry Mejia [id number] 1940-05-26  HISTORY OF PRESENT ILLNESS: I had the pleasure of seeing Larry Mejia in follow-up in the neurology clinic on 01/21/2018.  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 He is again accompanied by his wife who helps supplement the history today. MMSE 22/30 in July 2019 (MOCA score in January 2019 was 22/30 ,23/30 in July 2018). There has been continued decline and a change in symptoms since his last visit. His wife handed me a letter detailing changes since his last visit. He has more tremors. He gets very agitated in the late afternoons, evenings, cloudy days. Mirrors bother him, he thinks someone else is in the room with him. He is very confused locating rooms in their home. At times he thinks he is in a motel/facility and will have to move out. He gets confused with "Bear," which is her nickname. He has trouble dressing and pulling clothes over his head. He carries on conversations with "other people" either at night or sometimes during the day. He has shown signs of being unsteady.  He again becomes flustered at the beginning of the appointment, unable to answer what they are doing for Thanksgiving tomorrow. He again reports "flustration" that he is unable to do things he used to do. He has not been driving for the past 2 years. His wife manages medications and finances. When his wife asks him to describe the people at home last night, he states that he was with a small group putting together some sort of program, and after midnight he did not feel they were doing much progress. He reports there were 6 people that he could see and hear talking, the most vivid for him was that it was getting late without accomplishing anything. He also describes the incident with mirrors, he walked down the hall one night and saw a man standing in front of him without any clothes. He wondered what in the world  he had gotten into, called his wife, who told him he was looking at a Geologist, engineering. He went back and continued to see a different person in front of him, she led him toward the mirror so it was right in front of his nose but he still did not believe it. He tells his wife it is not a Geologist, engineering, it's a person, it is not really me, it is a different person. This is not scary for him, he states it irritates him more than anything. He denies any headaches, dizziness, diplopia, focal numbness/tingling/weakness, no falls. He had a rash after increasing Propranolol to 20mg  BID for the tremors, and would like to wait for allergy testing in January before increasing dose back again, he is back on 10mg  BID.  HPI 09/04/2016: This is a 77 yo RH man with a history of hypertension, paroxysmal atrial fibrillation, Barrett esophagus, GERD, tremor, with mild cognitive impairment. He feels frustrated because he knows something but "can't get the right key." He searches for answers when asked questions. He would walk into a room and stop and think what he went to get. It comes to him in a few seconds. He denies getting lost driving, but now has to pull up a picture if he is given instructions. His wife noticed this worsen in the past year. He likes to tinker with things and knows how to fit pieces together, but describes the sensation as "like somebody else if using that  file before I could use it." He denies missing medications. He always loses his sunglasses or keys. His wife is in charge of finances. His wife has noticed more irritation if she tries to help him or asks him a follow-up question to help him. She has expressed some concern about driving and states she has taken over some of it. He had a workup for memory loss with his PCP, and was found to have a low B12 level of 71 last January 2018. TSH normal. I personally reviewed MRI brain with and without contrast which did not show any acute changes. There was generalized volume loss,  mild to moderate chronic microvascular disease. He has been on B12 injections since then and has not noticed much change in cognition. Most recent B12 level yesterday was 322. He is also concerned about long-term Nexium use contributing/causing memory issues. He has had tremors in both hands L>R for the past year, more with action but it does not affect eating or writing. Propranolol may be helping some. It only bothers him when he picks up a glass and moves it horizontally, he denies dropping things. He denies any headaches, dizziness, diplopia, dysarthria/dysphagia, neck/back pain, focal numbness/tingling/weakness, bowel/bladder dysfunction, anosmia. His mother and paternal uncle had memory issues. He denies any significant head injuries. He drinks 1 glass of wine a day. They deny any difficulties with ADLs, no hallucinations or paranoia  PAST MEDICAL HISTORY: Past Medical History:  Diagnosis Date  . Aortic insufficiency     mild...echo.Marland KitchenMarland Kitchen3/2007....Marland KitchenMarland KitchenAortic root...upper normal ...echo.Marland KitchenMarland Kitchen3/2007  . Barrett esophagus   . BPH (benign prostatic hypertrophy)   . Chest pain    May, 2012  . Colonic polyp   . Ejection fraction    55-60% EF, echo, 2007  . GERD (gastroesophageal reflux disease)   . History of colonic polyps 10/13/2006   No polyps 2015 age 77-no further colonscopies    . HTN (hypertension)   . Malignant melanoma of other specified sites of skin   . Mitral regurgitation    Mild, echo, 2007  . Nephrolithiasis   . Paroxysmal atrial fibrillation (HCC)    One episode remotely  . RBBB (right bundle branch block)   . SVT (supraventricular tachycardia) ([place])   . Tremor    Essential tremor    MEDICATIONS: Current Outpatient Medications on File Prior to Visit  Medication Sig Dispense Refill  . acetaminophen (TYLENOL) 325 MG tablet Take 650 mg by mouth every 6 (six) hours as needed.      . donepezil (ARICEPT) 10 MG tablet Take 1 tablet daily 30 tablet 11  . esomeprazole (NEXIUM) 40 MG  capsule Take 40 mg by mouth 2 (two) times daily.     Marland Kitchen loratadine (CLARITIN) 10 MG tablet Take 10 mg by mouth as needed.     . propranolol (INDERAL) 20 MG tablet TAKE 1 TABLET BY MOUTH TWICE DAILY. 180 tablet 0   Current Facility-Administered Medications on File Prior to Visit  Medication Dose Route Frequency Provider Last Rate Last Dose  . cyanocobalamin ((VITAMIN B-12)) injection 1,000 mcg  1,000 mcg Intramuscular Q30 days Marin Olp, MD   1,000 mcg at 01/20/17 0940    ALLERGIES: Allergies  Allergen Reactions  . Iohexol     Contrast dye causes flushing and tingling    FAMILY HISTORY: Family History  Problem Relation Age of Onset  . Stroke Father 70  . Colon cancer Father 51    SOCIAL HISTORY: Social History   Socioeconomic History  .  Marital status: Married    Spouse name: Not on file  . Number of children: Not on file  . Years of education: Not on file  . Highest education level: Not on file  Occupational History  . Not on file  Social Needs  . Financial resource strain: Not on file  . Food insecurity:    Worry: Not on file    Inability: Not on file  . Transportation needs:    Medical: Not on file    Non-medical: Not on file  Tobacco Use  . Smoking status: Former Smoker    Packs/day: 0.25    Years: 6.00    Pack years: 1.50    Types: Cigarettes    Last attempt to quit: 02/25/1973    Years since quitting: 44.9  . Smokeless tobacco: Never Used  Substance and Sexual Activity  . Alcohol use: Yes    Comment: wine or scotch at night  . Drug use: No  . Sexual activity: Not on file  Lifestyle  . Physical activity:    Days per week: Not on file    Minutes per session: Not on file  . Stress: Not on file  Relationships  . Social connections:    Talks on phone: Not on file    Gets together: Not on file    Attends religious service: Not on file    Active member of club or organization: Not on file    Attends meetings of clubs or organizations: Not on file      Relationship status: Not on file  . Intimate partner violence:    Fear of current or ex partner: Not on file    Emotionally abused: Not on file    Physically abused: Not on file    Forced sexual activity: Not on file  Other Topics Concern  . Not on file  Social History Narrative   Married 1963. 2 sons-1 with barrett's. Lost 1 grandkid at age 67, another 47 living in 50.    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       Hobbies[place]    REVIEW OF SYSTEMS: Constitutional: No fevers, chills, or sweats, no generalized fatigue, change in appetite Eyes: No visual changes, double vision, eye pain Ear, nose and throat: No hearing loss, ear pain, nasal congestion, sore throat Cardiovascular: No chest pain, palpitations Respiratory:  No shortness of breath at rest or with exertion, wheezes GastrointestinaI: No nausea, vomiting, diarrhea, abdominal pain, fecal incontinence Genitourinary:  No dysuria, urinary retention or frequency Musculoskeletal:  No neck pain, back pain Integumentary: No rash, pruritus, skin lesions Neurological: as above Psychiatric: No depression, insomnia, +anxiety Endocrine: No palpitations, fatigue, diaphoresis, mood swings, change in appetite, change in weight, increased thirst Hematologic/Lymphatic:  No anemia, purpura, petechiae. Allergic/Immunologic: no itchy/runny eyes, nasal congestion, recent allergic reactions, rashes  PHYSICAL EXAM: Vitals:   01/21/18 0939  BP: (!) 142/76  Pulse: 61  SpO2: 94%   General: No acute distress, he again becomes anxious when asked questions Head:  Normocephalic/atraumatic Neck: supple, no paraspinal tenderness, full range of motion Heart:  Regular rate and rhythm Lungs:  Clear to auscultation bilaterally Back: No paraspinal tenderness Skin/Extremities: No rash, no edema Neurological Exam: alert and oriented to  person, place, states it is January, he could not say what they are doing tomorrow for Thanksgiving (family is visiting them). No aphasia or dysarthria.  Fund of knowledge is reduced. Recent and remote memory are impaired.  Attention and concentration are normal. Able to name objects and repeat phrases. Cranial nerves: Pupils equal, round, reactive to light.  Extraocular movements intact with no nystagmus. Visual fields full but he appears to have more difficulties counting fingers. Facial sensation intact. No facial asymmetry. Tongue, uvula, palate midline.  Motor: Bulk and tone normal, no cogwheeling, muscle strength 5/5 throughout with no pronator drift.  Sensation to light touch intact.  No extinction to double simultaneous stimulation.  Deep tendon reflexes 2+ throughout, toes downgoing.  Finger to nose testing intact.  Gait narrow-based and steady, able to tandem walk adequately.  Romberg negative. No resting tremor, +postural and action tremor (similar to prior). Good finger and foot taps.  IMPRESSION: This is a 77 yo RH man with a history of  hypertension, paroxysmal atrial fibrillation, Barrett esophagus, GERD, tremor, with progressive dementia, now with delusions and hallucinations. MMSE in May 2019 was 22/30. Although this is likely progression of underlying dementia, I would like to repeat brain imaging to assess for underlying structural abnormality. MRI brain with and without contrast will be ordered. Continue Donepezil 10mg  daily. He continues to report anxiety, his wife spoke to me separately and states he got very panicky when she left him alone for an hour, he is agreeable to starting low dose Lexapro 10mg  daily, side effects discussed. We may start Depakote or Seroquel in the future if hallucinations become more bothersome. I have been encouraging him to see a psychologist for the anxiety, he is scheduled to see Dr. Cheryln Manly next month, however with worsening dementia, he may have difficulties  with psychotherapy, he is repetitive in the office today. Continue Propranolol 10mg  BID for now, we may increase later on if allergy testing is negative. We again discussed the importance of control of vascular risk factors, physical exercise, and brain stimulation exercises for brain health. He will follow-up in 4 months and knows to call for any changes.   Thank you for allowing me to participate in his care.  Please do not hesitate to call for any questions or concerns.  The duration of this appointment visit was 30 minutes of face-to-face time with the patient.  Greater than 50% of this time was spent in counseling, explanation of diagnosis, planning of further management, and coordination of care.   Ellouise Newer, M.D.   CC: Dr. Yong Channel

## 2018-02-03 ENCOUNTER — Ambulatory Visit
Admission: RE | Admit: 2018-02-03 | Discharge: 2018-02-03 | Disposition: A | Discharge status: Home / Self Care | Payer: Medicare Other | Source: Ambulatory Visit | Attending: Neurology | Admitting: Neurology

## 2018-02-03 DIAGNOSIS — R413 Other amnesia: Secondary | ICD-10-CM | POA: Diagnosis not present

## 2018-02-03 DIAGNOSIS — F0391 Unspecified dementia with behavioral disturbance: Secondary | ICD-10-CM

## 2018-02-03 DIAGNOSIS — F03B18 Unspecified dementia, moderate, with other behavioral disturbance: Secondary | ICD-10-CM

## 2018-02-03 DIAGNOSIS — R443 Hallucinations, unspecified: Secondary | ICD-10-CM

## 2018-02-03 MED ORDER — GADOBENATE DIMEGLUMINE 529 MG/ML IV SOLN
15.0000 mL | Freq: Once | INTRAVENOUS | Status: AC | PRN
  Administered 2018-02-03: 15 mL via INTRAVENOUS

## 2018-02-13 ENCOUNTER — Other Ambulatory Visit: Payer: Self-pay | Admitting: Family Medicine

## 2018-02-16 ENCOUNTER — Ambulatory Visit (INDEPENDENT_AMBULATORY_CARE_PROVIDER_SITE_OTHER): Payer: Medicare Other | Admitting: Psychology

## 2018-02-16 DIAGNOSIS — F331 Major depressive disorder, recurrent, moderate: Secondary | ICD-10-CM | POA: Diagnosis not present

## 2018-02-16 DIAGNOSIS — F411 Generalized anxiety disorder: Secondary | ICD-10-CM | POA: Diagnosis not present

## 2018-03-02 DIAGNOSIS — L233 Allergic contact dermatitis due to drugs in contact with skin: Secondary | ICD-10-CM | POA: Diagnosis not present

## 2018-03-02 DIAGNOSIS — Z0182 Encounter for allergy testing: Secondary | ICD-10-CM | POA: Diagnosis not present

## 2018-03-04 DIAGNOSIS — L251 Unspecified contact dermatitis due to drugs in contact with skin: Secondary | ICD-10-CM | POA: Diagnosis not present

## 2018-03-06 DIAGNOSIS — L233 Allergic contact dermatitis due to drugs in contact with skin: Secondary | ICD-10-CM | POA: Diagnosis not present

## 2018-03-09 ENCOUNTER — Telehealth: Payer: Self-pay | Admitting: Family Medicine

## 2018-03-09 NOTE — Telephone Encounter (Signed)
Copied from [place] [id number]. Topic: Quick Communication - See Telephone Encounter >> Mar 09, 2018 10:16 AM Antonieta Iba C wrote: CRM for notification. See Telephone encounter for: 03/09/18.  Spouse called in to ask PCP. Pt is currently taking 1/2 tablet of propranolol (INDERAL) 20 MG tablet, spouse says that pt's tremor is getting worse, she would like to know if they can increase to 1 tablet?  CB: [id number]

## 2018-03-09 NOTE — Telephone Encounter (Signed)
See note

## 2018-03-11 NOTE — Telephone Encounter (Signed)
Called Mrs. Bangs and advised. She will contact the office if they start to notice any dizziness or lightheadedness on Propranolol 20 mg BID.

## 2018-03-11 NOTE — Telephone Encounter (Signed)
Is he taking 1/2 tablet of 20 mg propranolol once or twice a day?  I thought he was taking 20 mg twice a day per my last note-please clarify-was dose reduced sometime after last visit?

## 2018-03-11 NOTE — Telephone Encounter (Signed)
Okay for him to trial 20 mg twice a day as long as he does not get lightheaded or dizzy with the higher dose

## 2018-03-11 NOTE — Telephone Encounter (Signed)
Forwarding to Dr. Hunter to advise.  

## 2018-03-11 NOTE — Telephone Encounter (Signed)
Spoke with Mrs. Heffington and apologized for the delayed response. She confirmed that rx is written for Propranolol 20 mg BID but she thought is was discussed that he should try just taking 1/2 tablet BID, which is what he has been doing. While taking Propranolol 20 mg, 1/2 tablet BID, they have noticed increased tremors. She would like to know if it would be OK for him to take Rx as prescribed, Propranolol 20 mg BID.   Forwarding to Dr. Yong Channel to advise.

## 2018-03-12 ENCOUNTER — Ambulatory Visit (INDEPENDENT_AMBULATORY_CARE_PROVIDER_SITE_OTHER): Payer: Medicare Other | Admitting: Psychology

## 2018-03-12 DIAGNOSIS — F411 Generalized anxiety disorder: Secondary | ICD-10-CM | POA: Diagnosis not present

## 2018-03-12 DIAGNOSIS — F331 Major depressive disorder, recurrent, moderate: Secondary | ICD-10-CM | POA: Diagnosis not present

## 2018-03-17 ENCOUNTER — Ambulatory Visit: Payer: Medicare Other | Admitting: Neurology

## 2018-03-23 ENCOUNTER — Ambulatory Visit: Payer: Self-pay | Admitting: *Deleted

## 2018-03-23 NOTE — Telephone Encounter (Signed)
noted 

## 2018-03-23 NOTE — Telephone Encounter (Signed)
Patient has had a rash for several years- it has gotten progressively worse- spreading to trunk area and arms. Patient's wife is calling to report that patient has had a rash for several years- he has seen dermatologist about it and really has not gotten answers for the reason he has it. They have noticed with the increase in dosage of propanolol- the rash is getting worse and spreading- thunk and arms- she is concerned at this point about the new areas.  Reason for Disposition . Mild widespread rash    Increase in severity of rash- seems to be spreading since dosage of medication- propanolol has been increased.  Answer Assessment - Initial Assessment Questions 1. APPEARANCE of RASH: "Describe the rash." (e.g., spots, blisters, raised areas, skin peeling, scaly)     Red and  raised 2. SIZE: "How big are the spots?" (e.g., tip of pen, eraser, coin; inches, centimeters)     Very small to eraser tip size 3. LOCATION: "Where is the rash located?"     Lower back, chest, arm and under arm 4. COLOR: "What color is the rash?" (Note: It is difficult to assess rash color in people with darker-colored skin. When this situation occurs, simply ask the caller to describe what they see.)     red 5. ONSET: "When did the rash begin?"     Years- several 6. FEVER: "Do you have a fever?" If so, ask: "What is your temperature, how was it measured, and when did it start?"     no 7. ITCHING: "Does the rash itch?" If so, ask: "How bad is the itch?" (Scale 1-10; or mild, moderate, severe)     no 8. CAUSE: "What do you think is causing the rash?"     Not sure 9. MEDICATION FACTORS: "Have you started any new medications within the last 2 weeks?" (e.g., antibiotics)      Propanolol - increased dosage and rash got worse 10. OTHER SYMPTOMS: "Do you have any other symptoms?" (e.g., dizziness, headache, sore throat, joint pain)       no 11. PREGNANCY: "Is there any chance you are pregnant?" "When was your last menstrual  period?"       n/a  Protocols used: RASH OR REDNESS - [facility]

## 2018-03-23 NOTE — Telephone Encounter (Signed)
See note

## 2018-03-24 ENCOUNTER — Ambulatory Visit (INDEPENDENT_AMBULATORY_CARE_PROVIDER_SITE_OTHER): Payer: Medicare Other | Admitting: Family Medicine

## 2018-03-24 ENCOUNTER — Encounter: Payer: Self-pay | Admitting: Family Medicine

## 2018-03-24 VITALS — BP 150/82 | HR 52 | Temp 97.8°F | Ht 68.0 in | Wt 156.4 lb

## 2018-03-24 DIAGNOSIS — I10 Essential (primary) hypertension: Secondary | ICD-10-CM | POA: Diagnosis not present

## 2018-03-24 DIAGNOSIS — L298 Other pruritus: Secondary | ICD-10-CM | POA: Diagnosis not present

## 2018-03-24 DIAGNOSIS — G250 Essential tremor: Secondary | ICD-10-CM | POA: Diagnosis not present

## 2018-03-24 MED ORDER — NADOLOL 20 MG PO TABS: 20.0000 mg | ORAL_TABLET | Freq: Every day | ORAL | 5 refills | Status: DC

## 2018-03-24 NOTE — Assessment & Plan Note (Signed)
S: Tremor seems worse lately despite continued propranolol 20 mg twice a day-plus to have a concern about rash A/P: We will trial nadolol as an alternate-suspect we will need to titrate the dose up.  We did discuss possibly using primidone but due to the side effect profile that I read off to them they were concerned he did not want to start this.

## 2018-03-24 NOTE — Assessment & Plan Note (Signed)
S:4-5 years of rash in total. Pruritic on his trunk.  Rash has recently worsened and now spreading up onto the shoulders and arms.  Has seen Larry Mejia in the past- was on a cream and was minimally helpful previously- has tried again recently on triamcinolone cream- has been renewing prescription. Still seeing Larry Mejia office even after his retirement.  Also had allergy testing on his back- mild redness on back from lanolin. This was in the last 3 weeks.  They have tried fragrant free detergents and soaps.   They asked their dermatologist about propranolol potentially causing the rash which they had read about.  They wonder about propranolol causing rash. Would like to try alternate A/P: Rash of unclear etiology-following with dermatology.  Rash seems to have worsened in the last month-since they have been on propranolol for years they wonder if it could be contributing and want to trial off.  We will stop propranolol and trial nadolol.  I do not have a strong suspicion this will resolve issue but reasonable to trial

## 2018-03-24 NOTE — Assessment & Plan Note (Signed)
Hypertension poorly controlled today.  Patient admits to being very sedentary recently.  Have encouraged increased activity levels, cutting salt  Propranolol 20mg  AM, 20mg  PM --> trial nadolol in regards to rash.  I was clear with family I thought he may need additional medication.  Wife is to message me in 1 to 2 weeks to let me know how home blood pressures look and we may titrate upwards-then see back in 2 to 4 weeks

## 2018-03-24 NOTE — Progress Notes (Signed)
Phone [phone]   Subjective:  Larry Mejia is a 78 y.o. year old very pleasant male patient who presents for/with See problem oriented charting ROS-not ill appearing, no fever/chills. No new medications recently- most recent addition was lexapro but didn't start after that was started a month prior to worsening). Not immunocompromised. No mucus membrane involvement.  Past Medical History-  Patient Active Problem List   Diagnosis Date Noted  . Vitamin B 12 deficiency 03/29/2016    Priority: High  . Memory loss 03/06/2016    Priority: High  . Paroxysmal atrial fibrillation (HCC)     Priority: High  . History of supraventricular tachycardia     Priority: Medium  . HTN (hypertension)     Priority: Medium  . Essential tremor     Priority: Medium  . History of melanoma 05/31/2008    Priority: Medium  . GERD 07/30/2007    Priority: Medium  . BARRETTS ESOPHAGUS 07/30/2007    Priority: Medium  . Allergic rhinitis 02/09/2014    Priority: Low  . Insomnia 02/09/2014    Priority: Low  . Mitral regurgitation     Priority: Low  . Aortic insufficiency     Priority: Low  . RBBB (right bundle branch block)     Priority: Low  . NEPHROLITHIASIS, HX OF 05/31/2008    Priority: Low  . BPH (benign prostatic hyperplasia) 05/31/2008    Priority: Low  . Chronic pruritic rash in adult 03/24/2018  . Senile purpura ([place]) 03/06/2017  . Voiding difficulty 03/06/2017    Medications- reviewed and updated Current Outpatient Medications  Medication Sig Dispense Refill  . acetaminophen (TYLENOL) 325 MG tablet Take 650 mg by mouth every 6 (six) hours as needed.      . donepezil (ARICEPT) 10 MG tablet Take 1 tablet daily 90 tablet 3  . escitalopram (LEXAPRO) 10 MG tablet Take 1 tablet (10 mg total) by mouth at bedtime. 30 tablet 11  . esomeprazole (NEXIUM) 40 MG capsule Take 40 mg by mouth 2 (two) times daily.     Marland Kitchen loratadine (CLARITIN) 10 MG tablet Take 10 mg by mouth as needed.     .  propranolol (INDERAL) 20 MG tablet TAKE 1 TABLET BY MOUTH TWICE DAILY. 180 tablet 1   Current Facility-Administered Medications  Medication Dose Route Frequency Provider Last Rate Last Dose  . cyanocobalamin ((VITAMIN B-12)) injection 1,000 mcg  1,000 mcg Intramuscular Q30 days Marin Olp, MD   1,000 mcg at 01/20/17 0940     Objective:  BP (!) 150/82 (BP Location: Left Arm, Patient Position: Sitting, Cuff Size: Large)   Pulse (!) 52   Temp 97.8 F (36.6 C) (Oral)   Ht 5\' 8"  (1.727 m)   Wt 156 lb 6.4 oz (70.9 kg)   SpO2 96%   BMI 23.78 kg/m  Gen: NAD, resting comfortably CV: RRR no murmurs rubs or gallops Lungs: CTAB no crackles, wheeze, rhonchi Ext: no edema Skin: warm, dry-multiple papules with some excoriation on trunk and on the upper arms and shoulders-these are erythematous Neuro: Speech normal-looks to wife for many answers    Assessment and Plan   Also PHQ 9 remains elevated- likely will diagnose as major depression at next visit and increase Lexapro to 20 mg if does not make further progress with Dr. Cheryln Manly before that time  HTN (hypertension) Hypertension poorly controlled today.  Patient admits to being very sedentary recently.  Have encouraged increased activity levels, cutting salt  Propranolol 20mg  AM, 20mg  PM -->  trial nadolol in regards to rash.  I was clear with family I thought he may need additional medication.  Wife is to message me in 1 to 2 weeks to let me know how home blood pressures look and we may titrate upwards-then see back in 2 to 4 weeks  Essential tremor S: Tremor seems worse lately despite continued propranolol 20 mg twice a day-plus to have a concern about rash A/P: We will trial nadolol as an alternate-suspect we will need to titrate the dose up.  We did discuss possibly using primidone but due to the side effect profile that I read off to them they were concerned he did not want to start this.  Chronic pruritic rash in adult S:4-5  years of rash in total. Pruritic on his trunk.  Rash has recently worsened and now spreading up onto the shoulders and arms.  Has seen Dr. Allyson Sabal in the past- was on a cream and was minimally helpful previously- has tried again recently on triamcinolone cream- has been renewing prescription. Still seeing Dr. Ledell Peoples office even after his retirement.  Also had allergy testing on his back- mild redness on back from lanolin. This was in the last 3 weeks.  They have tried fragrant free detergents and soaps.   They asked their dermatologist about propranolol potentially causing the rash which they had read about.  They wonder about propranolol causing rash. Would like to try alternate A/P: Rash of unclear etiology-following with dermatology.  Rash seems to have worsened in the last month-since they have been on propranolol for years they wonder if it could be contributing and want to trial off.  We will stop propranolol and trial nadolol.  I do not have a strong suspicion this will resolve issue but reasonable to trial   Future Appointments  Date Time Provider [place]  03/25/2018 11:30 AM Oren Binet, PhD [facility] None  04/06/2018  1:20 PM Marin Olp, MD [facility]  05/18/2018  2:30 PM Cameron Sprang, MD [facility] None  07/07/2018 11:00 AM Marin Olp, MD [facility]    Meds ordered this encounter  Medications  . nadolol (CORGARD) 20 MG tablet    Sig: Take 1 tablet (20 mg total) by mouth daily.    Dispense:  30 tablet    Refill:  5    Return precautions advised.  Garret Reddish, MD

## 2018-03-24 NOTE — Patient Instructions (Addendum)
Stop propranolol  Trial nadolol 20mg  once a day  Lets check back in 2-4 weeks from now  Let me know in 1-2 weeks through mychart how blood pressure is doing as I may increase the dose on nadolol- honestly you may need a second medicine to help if stays up. Try to go low salt in diet and exercise regularly as well.

## 2018-03-25 ENCOUNTER — Ambulatory Visit (INDEPENDENT_AMBULATORY_CARE_PROVIDER_SITE_OTHER): Payer: Medicare Other | Admitting: Psychology

## 2018-03-25 DIAGNOSIS — F331 Major depressive disorder, recurrent, moderate: Secondary | ICD-10-CM | POA: Diagnosis not present

## 2018-03-25 DIAGNOSIS — F411 Generalized anxiety disorder: Secondary | ICD-10-CM | POA: Diagnosis not present

## 2018-03-31 ENCOUNTER — Encounter: Payer: Self-pay | Admitting: Family Medicine

## 2018-03-31 MED ORDER — AMLODIPINE BESYLATE 2.5 MG PO TABS: 2.5000 mg | ORAL_TABLET | Freq: Every day | ORAL | 3 refills | Status: DC

## 2018-04-01 ENCOUNTER — Encounter: Payer: Self-pay | Admitting: Family Medicine

## 2018-04-06 ENCOUNTER — Ambulatory Visit (INDEPENDENT_AMBULATORY_CARE_PROVIDER_SITE_OTHER): Payer: Medicare Other | Admitting: Family Medicine

## 2018-04-06 ENCOUNTER — Encounter: Payer: Self-pay | Admitting: Family Medicine

## 2018-04-06 VITALS — BP 130/80 | HR 41 | Temp 97.6°F | Ht 68.0 in | Wt 155.2 lb

## 2018-04-06 DIAGNOSIS — R21 Rash and other nonspecific skin eruption: Secondary | ICD-10-CM

## 2018-04-06 DIAGNOSIS — L858 Other specified epidermal thickening: Secondary | ICD-10-CM | POA: Diagnosis not present

## 2018-04-06 DIAGNOSIS — I10 Essential (primary) hypertension: Secondary | ICD-10-CM

## 2018-04-06 DIAGNOSIS — L298 Other pruritus: Secondary | ICD-10-CM

## 2018-04-06 NOTE — Progress Notes (Signed)
Phone [phone]   Subjective:  Larry Mejia is a 78 y.o. year old very pleasant male patient who presents for/with See problem oriented charting ROS-pruritic rash, no chest pain.  Feels cold easily.  Denies new recent medications.  Continued memory issues  Past Medical History-  Patient Active Problem List   Diagnosis Date Noted  . Vitamin B 12 deficiency 03/29/2016    Priority: High  . Memory loss 03/06/2016    Priority: High  . Paroxysmal atrial fibrillation (HCC)     Priority: High  . History of supraventricular tachycardia     Priority: Medium  . HTN (hypertension)     Priority: Medium  . Essential tremor     Priority: Medium  . History of melanoma 05/31/2008    Priority: Medium  . GERD 07/30/2007    Priority: Medium  . BARRETTS ESOPHAGUS 07/30/2007    Priority: Medium  . Allergic rhinitis 02/09/2014    Priority: Low  . Insomnia 02/09/2014    Priority: Low  . Mitral regurgitation     Priority: Low  . Aortic insufficiency     Priority: Low  . RBBB (right bundle branch block)     Priority: Low  . NEPHROLITHIASIS, HX OF 05/31/2008    Priority: Low  . BPH (benign prostatic hyperplasia) 05/31/2008    Priority: Low  . Chronic pruritic rash in adult 03/24/2018  . Senile purpura ([place]) 03/06/2017  . Voiding difficulty 03/06/2017    Medications- reviewed and updated Current Outpatient Medications  Medication Sig Dispense Refill  . acetaminophen (TYLENOL) 325 MG tablet Take 650 mg by mouth every 6 (six) hours as needed.      Marland Kitchen amLODipine (NORVASC) 2.5 MG tablet Take 1 tablet (2.5 mg total) by mouth daily. 90 tablet 3  . donepezil (ARICEPT) 10 MG tablet Take 1 tablet daily 90 tablet 3  . escitalopram (LEXAPRO) 10 MG tablet Take 1 tablet (10 mg total) by mouth at bedtime. 30 tablet 11  . esomeprazole (NEXIUM) 40 MG capsule Take 40 mg by mouth 2 (two) times daily.     Marland Kitchen loratadine (CLARITIN) 10 MG tablet Take 10 mg by mouth as needed.     . nadolol (CORGARD) 20  MG tablet Take 1 tablet (20 mg total) by mouth daily. 30 tablet 5   Current Facility-Administered Medications  Medication Dose Route Frequency Provider Last Rate Last Dose  . cyanocobalamin ((VITAMIN B-12)) injection 1,000 mcg  1,000 mcg Intramuscular Q30 days Marin Olp, MD   1,000 mcg at 01/20/17 0940     Objective:  BP 130/80 (BP Location: Right Arm, Patient Position: Sitting, Cuff Size: Normal)   Pulse (!) 41   Temp 97.6 F (36.4 C) (Oral)   Ht 5\' 8"  (1.727 m)   Wt 155 lb 3.2 oz (70.4 kg)   SpO2 99%   BMI 23.60 kg/m  Gen: NAD, resting comfortably CV: Bradycardic but regular no murmurs rubs or gallops Lungs: CTAB no crackles, wheeze, rhonchi Ext: no edema Skin: warm, dry, multiple erythematous papules throughout trunk-many with excoriation- largest up to about 8 mm.  We chose 1 of the recently developed on the right flank to biopsy as below. Neuro: Some memory loss-wife supports him in speech  Skin Biopsy Procedure Note   PRE-OP DIAGNOSIS: Rash of unknown etiology- drug reaction? POST-OP DIAGNOSIS: Same  Size of lesion: 6 mm Location of lesion erythematous papule on right flank PROCEDURE: 4 mm punch biopsy  Verbal consent was obtained-offered to have this done at  dermatology instead release have a discussion there-family and patient declines and wants to move forward with procedure here.  The area surrounding the skin lesion was prepared and draped in the usual sterile manner. The lesion was removed in the usual manner by the biopsy method noted above. Hemostasis was assured. The patient tolerated the procedure well.  Closure: No closure, silver nitrate to stop bleeding  Followup: The patient tolerated the procedure well without complications.  Standard post-procedure care is explained and return precautions are given.     Assessment and Plan    HTN (hypertension) S: controlled on amlodipine 2.5 mg and propranolol 20mg . Home BP had been in 150s at start of  therapy but have trended down to low 130s. HR since going to nadolol is down into 40s at times.  BP Readings from Last 3 Encounters:  04/06/18 130/80  03/24/18 (!) 150/82  01/21/18 (!) 142/76  A/P: We discussed blood pressure goal of <140/90. Continue current meds:  But decrease nadolol to 10mg  (cut dose in half) then update me in 2-3 weeks by mychart with repeat visit in 3 months.  Continue amlodipine 2.5 mg  Chronic pruritic rash in adult Rash-primarily over the trunk s: Chronic pruritic rash 4-5 years. Has seen derm and never had biopsy. Seeing allergist next week. When they last saw dermatology they were told possible drug reaction and to follow-up with PCP.  Prior patch testing with dermatology only showed modest reaction to lanolin  They were concerned about drug reaction with propranolol last visit-we trialed off of that but no improvement-continue to have some small new lesions intermittently-1 of which we biopsied today. A/P:Right flank papule 6 mm wide- 4 mm punch biopsy performed.  We discussed potentially doing this biopsy at the dermatology office- patient and wife would prefer to have it done here.  They also see an allergist next week.   Future Appointments  Date Time Provider [place]  04/09/2018 11:30 AM Oren Binet, PhD [facility] None  04/10/2018 11:00 AM [facility] HEALTH COACH [facility]  05/18/2018  2:30 PM Cameron Sprang, MD [facility] None  07/07/2018 11:00 AM Marin Olp, MD [facility]   Lab/Order associations: Rash - Plan: Pathologist smear review  Essential hypertension  Chronic pruritic rash in adult  Return precautions advised.  Garret Reddish, MD

## 2018-04-06 NOTE — Patient Instructions (Addendum)
You had a biopsy today- leave bandage in place for 24 hours. May then remove and wash with soapy water but not scrub. After bathing, reapply triple antibiotic ointment and bandaid.  If it bleeds- apply pressure up to 30 minutes to an hour- seek care if continues to bleed past that point. If you have expanding redness or worsening pain then let us reevaluate the spot.   We have ordered labs or studies at this visit (pathology of lesion). It can take up to 1-2 weeks for results and processing. IF results require follow up or explanation, we will call you with instructions. Clinically stable results will be released to your [facility]. If you have not heard from [place] or cannot find your results in [facility] in 2 weeks please contact our office at [phone]   Blood pressure looks much better- cut nadolol in half to see if we can get heart rate into the 50s more consistently. Great job doing the home checks- lets do them 3-4 days a week and then update me in 2-3 weeks. Pressure may go up slightly with reducing th nadolol. Continue amlodipine full dose- we can consider increasing that dose if needed.

## 2018-04-06 NOTE — Assessment & Plan Note (Signed)
Rash-primarily over the trunk s: Chronic pruritic rash 4-5 years. Has seen derm and never had biopsy. Seeing allergist next week. When they last saw dermatology they were told possible drug reaction and to follow-up with PCP.  Prior patch testing with dermatology only showed modest reaction to lanolin  They were concerned about drug reaction with propranolol last visit-we trialed off of that but no improvement-continue to have some small new lesions intermittently-1 of which we biopsied today. A/P:Right flank papule 6 mm wide- 4 mm punch biopsy performed.  We discussed potentially doing this biopsy at the dermatology office- patient and wife would prefer to have it done here.  They also see an allergist next week.

## 2018-04-06 NOTE — Assessment & Plan Note (Signed)
S: controlled on amlodipine 2.5 mg and propranolol 20mg . Home BP had been in 150s at start of therapy but have trended down to low 130s. HR since going to nadolol is down into 40s at times.  BP Readings from Last 3 Encounters:  04/06/18 130/80  03/24/18 (!) 150/82  01/21/18 (!) 142/76  A/P: We discussed blood pressure goal of <140/90. Continue current meds:  But decrease nadolol to 10mg  (cut dose in half) then update me in 2-3 weeks by mychart with repeat visit in 3 months.  Continue amlodipine 2.5 mg

## 2018-04-06 NOTE — Addendum Note (Signed)
Addended by: Marin Olp on: 04/06/2018 02:27 PM   Modules accepted: Orders

## 2018-04-09 ENCOUNTER — Ambulatory Visit (INDEPENDENT_AMBULATORY_CARE_PROVIDER_SITE_OTHER): Payer: Medicare Other | Admitting: Psychology

## 2018-04-09 DIAGNOSIS — F331 Major depressive disorder, recurrent, moderate: Secondary | ICD-10-CM | POA: Diagnosis not present

## 2018-04-09 DIAGNOSIS — F411 Generalized anxiety disorder: Secondary | ICD-10-CM

## 2018-04-10 ENCOUNTER — Ambulatory Visit (INDEPENDENT_AMBULATORY_CARE_PROVIDER_SITE_OTHER): Payer: Medicare Other

## 2018-04-10 VITALS — BP 118/70 | HR 44 | Ht 68.0 in | Wt 156.8 lb

## 2018-04-10 DIAGNOSIS — Z0000 Encounter for general adult medical examination without abnormal findings: Secondary | ICD-10-CM | POA: Diagnosis not present

## 2018-04-10 NOTE — Patient Instructions (Signed)
Larry Mejia , Thank you for taking time to come for your Medicare Wellness Visit. I appreciate your ongoing commitment to your health goals. Please review the following plan we discussed and let me know if I can assist you in the future.   These are the goals we discussed: Goals    . <enter goal here> (pt-stated)     Improve memory.        This is a list of the screening recommended for you and due dates:  Health Maintenance  Topic Date Due  . Tetanus Vaccine  09/22/2019  . Flu Shot  Completed  . Pneumonia vaccines  Completed   Preventive Care for Adults  A healthy lifestyle and preventive care can promote health and wellness. Preventive health guidelines for adults include the following key practices.  . A routine yearly physical is a good way to check with your health care provider about your health and preventive screening. It is a chance to share any concerns and updates on your health and to receive a thorough exam.  . Visit your dentist for a routine exam and preventive care every 6 months. Brush your teeth twice a day and floss once a day. Good oral hygiene prevents tooth decay and gum disease.  . The frequency of eye exams is based on your age, health, family medical history, use  of contact lenses, and other factors. Follow your health care provider's recommendations for frequency of eye exams.  . Eat a healthy diet. Foods like vegetables, fruits, whole grains, low-fat dairy products, and lean protein foods contain the nutrients you need without too many calories. Decrease your intake of foods high in solid fats, added sugars, and salt. Eat the right amount of calories for you. Get information about a proper diet from your health care provider, if necessary.  . Regular physical exercise is one of the most important things you can do for your health. Most adults should get at least 150 minutes of moderate-intensity exercise (any activity that increases your heart rate and causes  you to sweat) each week. In addition, most adults need muscle-strengthening exercises on 2 or more days a week.  Silver Sneakers may be a benefit available to you. To determine eligibility, you may visit the website: www.silversneakers.com or contact program at 937-362-5808 Mon-Fri between 8AM-8PM.   . Maintain a healthy weight. The body mass index (BMI) is a screening tool to identify possible weight problems. It provides an estimate of body fat based on height and weight. Your health care provider can find your BMI and can help you achieve or maintain a healthy weight.   For adults 20 years and older: ? A BMI below 18.5 is considered underweight. ? A BMI of 18.5 to 24.9 is normal. ? A BMI of 25 to 29.9 is considered overweight. ? A BMI of 30 and above is considered obese.   . Maintain normal blood lipids and cholesterol levels by exercising and minimizing your intake of saturated fat. Eat a balanced diet with plenty of fruit and vegetables. Blood tests for lipids and cholesterol should begin at age 39 and be repeated every 5 years. If your lipid or cholesterol levels are high, you are over 50, or you are at high risk for heart disease, you may need your cholesterol levels checked more frequently. Ongoing high lipid and cholesterol levels should be treated with medicines if diet and exercise are not working.  . If you smoke, find out from your health care provider  how to quit. If you do not use tobacco, please do not start.  . If you choose to drink alcohol, please do not consume more than 2 drinks per day. One drink is considered to be 12 ounces (355 mL) of beer, 5 ounces (148 mL) of wine, or 1.5 ounces (44 mL) of liquor.  . If you are 26-28 years old, ask your health care provider if you should take aspirin to prevent strokes.  . Use sunscreen. Apply sunscreen liberally and repeatedly throughout the day. You should seek shade when your shadow is shorter than you. Protect yourself by wearing  long sleeves, pants, a wide-brimmed hat, and sunglasses year round, whenever you are outdoors.  . Once a month, do a whole body skin exam, using a mirror to look at the skin on your back. Tell your health care provider of new moles, moles that have irregular borders, moles that are larger than a pencil eraser, or moles that have changed in shape or color.

## 2018-04-10 NOTE — Progress Notes (Signed)
PCP notes: Last OV 04/06/2018   Health maintenance:Up to Date   Abnormal screenings: MMSE score of 18   Patient concerns: Memory concerns   Nurse concerns:Memory Concerns   Next PCP appt: 07/07/2018

## 2018-04-10 NOTE — Progress Notes (Signed)
Subjective:   Larry Mejia is a 78 y.o. male who presents for Medicare Annual/Subsequent preventive examination.  Review of Systems:  No ROS.  Medicare Wellness Visit. Additional risk factors are reflected in the social history. Cardiac Risk Factors include: advanced age (>30men, >59 women);male gender;Other (see comment)(Memory Issues) Patient lives with wife in a single story home. They do not have any pets. His sons live in [place] and [place]. He enjoys spending times out doors, watching the History Channel, enjoys working in the yard. Member at [employer].   Patient goes to bed around 10pm. He gets up around 2 times a night to go to the bathroom. No CPAP. He gets up around 7am. He feels rested most mornings when he wakes up.    Objective:    Vitals: BP 118/70 (BP Location: Left Arm, Patient Position: Sitting, Cuff Size: Large)   Pulse (!) 44   Ht 5\' 8"  (1.727 m)   Wt 156 lb 12.8 oz (71.1 kg)   SpO2 98%   BMI 23.84 kg/m   Body mass index is 23.84 kg/m.  Advanced Directives 04/10/2018 09/20/2016 12/23/2013  Does Patient Have a Medical Advance Directive? Yes Yes Yes  Type of Paramedic of [place];Living will Living will;Healthcare Power of Attorney Living will;Healthcare Power of Attorney  Does patient want to make changes to medical advance directive? No - Patient declined - -  Copy of [place] in Chart? No - copy requested No - copy requested -    Tobacco Social History   Tobacco Use  Smoking Status Former Smoker  . Packs/day: 0.25  . Years: 6.00  . Pack years: 1.50  . Types: Cigarettes  . Last attempt to quit: 02/25/1973  . Years since quitting: 45.1  Smokeless Tobacco Never Used     Counseling given: Not Answered     Past Medical History:  Diagnosis Date  . Aortic insufficiency     mild...echo.Marland KitchenMarland Kitchen3/2007....Marland KitchenMarland KitchenAortic root...upper normal ...echo.Marland KitchenMarland Kitchen3/2007  . Barrett esophagus   . BPH  (benign prostatic hypertrophy)   . Chest pain    May, 2012  . Colonic polyp   . Ejection fraction    55-60% EF, echo, 2007  . GERD (gastroesophageal reflux disease)   . History of colonic polyps 10/13/2006   No polyps 2015 age 41-no further colonscopies    . HTN (hypertension)   . Malignant melanoma of other specified sites of skin   . Mitral regurgitation    Mild, echo, 2007  . Nephrolithiasis   . Paroxysmal atrial fibrillation (HCC)    One episode remotely  . RBBB (right bundle branch block)   . SVT (supraventricular tachycardia) ([place])   . Tremor    Essential tremor   Past Surgical History:  Procedure Laterality Date  . CHOLECYSTECTOMY  02/13/1999   LC and IOC by Dr Leafy Kindle  . esophageal ablation  08/2013   at least 6 times  . [place]  . melanoma removal Left 1992   shoulder  . MENISCUS REPAIR Right 2012  . MOHS SURGERY  2011   forehead  . NISSEN FUNDOPLICATION  circa 1308   Dr Jeral Pinch  . TONSILLECTOMY  1966  . VASECTOMY  1981   Family History  Problem Relation Age of Onset  . Stroke Father 88  . Colon cancer Father 34  . Atrial fibrillation Sister   . Colon cancer Paternal Grandfather    Social History   Socioeconomic History  .  Marital status: Married    Spouse name: Not on file  . Number of children: Not on file  . Years of education: Not on file  . Highest education level: Not on file  Occupational History  . Not on file  Social Needs  . Financial resource strain: Not on file  . Food insecurity:    Worry: Not on file    Inability: Not on file  . Transportation needs:    Medical: Not on file    Non-medical: Not on file  Tobacco Use  . Smoking status: Former Smoker    Packs/day: 0.25    Years: 6.00    Pack years: 1.50    Types: Cigarettes    Last attempt to quit: 02/25/1973    Years since quitting: 45.1  . Smokeless tobacco: Never Used  Substance and Sexual Activity  . Alcohol use: Not Currently  . Drug use: No  .  Sexual activity: Not Currently  Lifestyle  . Physical activity:    Days per week: Not on file    Minutes per session: Not on file  . Stress: Not on file  Relationships  . Social connections:    Talks on phone: Not on file    Gets together: Not on file    Attends religious service: Not on file    Active member of club or organization: Not on file    Attends meetings of clubs or organizations: Not on file    Relationship status: Not on file  Other Topics Concern  . Not on file  Social History Narrative   Married 1963. 2 sons-1 with barrett's. Lost 1 grandkid at age 47, another 22 living in 72.    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       Hobbies: sail at the [facility]    Outpatient Encounter Medications as of 04/10/2018  Medication Sig  . acetaminophen (TYLENOL) 325 MG tablet Take 650 mg by mouth every 6 (six) hours as needed.    Marland Kitchen amLODipine (NORVASC) 2.5 MG tablet Take 1 tablet (2.5 mg total) by mouth daily.  Marland Kitchen donepezil (ARICEPT) 10 MG tablet Take 1 tablet daily  . escitalopram (LEXAPRO) 10 MG tablet Take 1 tablet (10 mg total) by mouth at bedtime.  Marland Kitchen esomeprazole (NEXIUM) 40 MG capsule Take 40 mg by mouth 2 (two) times daily.   Marland Kitchen loratadine (CLARITIN) 10 MG tablet Take 10 mg by mouth as needed.   . nadolol (CORGARD) 20 MG tablet Take 1 tablet (20 mg total) by mouth daily.   Facility-Administered Encounter Medications as of 04/10/2018  Medication  . cyanocobalamin ((VITAMIN B-12)) injection 1,000 mcg    Activities of Daily Living In your present state of health, do you have any difficulty performing the following activities: 04/10/2018  Hearing? N  Vision? N  Difficulty concentrating or making decisions? Y  Walking or climbing stairs? N  Dressing or bathing? N  Doing errands, shopping? Y  Comment Memory Issues, wife assists with appointments  Preparing Food and eating ? N  Using  the Toilet? N  In the past six months, have you accidently leaked urine? N  Do you have problems with loss of bowel control? N  Managing your Medications? Y  Comment Wife preps all medications  Managing your Finances? Y  Comment Wife managing finances  Housekeeping or managing your Housekeeping? N  Some recent data might be hidden  Patient Care Team: Marin Olp, MD as PCP - General (Family Medicine) Druscilla Brownie, MD as Consulting Physician (Dermatology) Ardelle Lesches, MD as Referring Physician (Gastroenterology) Dr. Christean Leaf [facility]) as Consulting Physician (Dentistry)   Assessment:   This is a routine wellness examination for [place].  Exercise Activities and Dietary recommendations Current Exercise Habits: Structured exercise class, Type of exercise: walking;strength training/weights;Other - see comments(exercise bike), Time (Minutes): 45, Frequency (Times/Week): 2, Weekly Exercise (Minutes/Week): 90, Intensity: Moderate, Exercise limited by: psychological condition(s)   Breakfast: 2 scrambled eggs, bacon, toast, and orange juice- goes out to breakfast every morning  Lunch: peanut butter crackers, sandwich, chips, apple slices, decaffeinated Pepsi  Dinner: Vegetable soup Cheese toast, strawberry ice cream for dessert, Chicken pot pie tonight with peaches, drinks decaffeinated tea  Goals    . <enter goal here> (pt-stated)     Improve memory.        Fall Risk Fall Risk  04/10/2018 01/21/2018 09/12/2017 03/11/2017 09/20/2016  Falls in the past year? 0 0 No No No  Comment - - - - -  Number falls in past yr: - 0 - - -  Injury with Fall? - 0 - - -    Depression Screen PHQ 2/9 Scores 04/10/2018 03/24/2018 01/05/2018 09/20/2016  PHQ - 2 Score 0 1 3 0  PHQ- 9 Score - 11 20 -    Cognitive Function MMSE - Mini Mental State Exam 04/10/2018 09/12/2017 09/20/2016  Not completed: - - Refused  Orientation to time 0 2 -  Orientation to Place 2 5 -  Registration 3 3 -    Attention/ Calculation 5 4 -  Recall 0 0 -  Language- name 2 objects 2 2 -  Language- repeat 1 1 -  Language- follow 3 step command 3 2 -  Language- read & follow direction 1 1 -  Write a sentence 1 1 -  Write a sentence-comments Sentence hard to read due to tremor - -  Copy design 0 1 -  Total score 18 22 -   Montreal Cognitive Assessment  03/11/2017 09/04/2016  Visuospatial/ Executive (0/5) 1 3  Naming (0/3) 3 3  Attention: Read list of digits (0/2) 2 2  Attention: Read list of letters (0/1) 1 1  Attention: Serial 7 subtraction starting at 100 (0/3) 2 2  Language: Repeat phrase (0/2) 2 2  Language : Fluency (0/1) 1 1  Abstraction (0/2) 2 2  Delayed Recall (0/5) 3 1  Orientation (0/6) 5 6  Total 22 23      Immunization History  Administered Date(s) Administered  . Influenza Split 12/03/2010, 03/03/2012  . Influenza, High Dose Seasonal PF 01/10/2016, 11/21/2016, 11/25/2017  . Influenza,inj,Quad PF,6+ Mos 02/10/2013, 02/09/2014, 11/16/2014  . Pneumococcal Conjugate-13 02/09/2014  . Pneumococcal Polysaccharide-23 09/21/2009  . Td 09/21/2009  . Zoster 05/12/2013  . Zoster Recombinat (Shingrix) 01/20/2017, 06/20/2017     Screening Tests Health Maintenance  Topic Date Due  . TETANUS/TDAP  09/22/2019  . INFLUENZA VACCINE  Completed  . PNA vac Low Risk Adult  Completed         Plan:   Follow Up with PCP as Advised  I have personally reviewed and noted the following in the patient's chart:   . Medical and social history . Use of alcohol, tobacco or illicit drugs  . Current medications and supplements . Functional ability and status . Nutritional status . Physical activity . Advanced directives . List of other physicians . Vitals . Screenings to include  cognitive, depression, and falls . Referrals and appointments  In addition, I have reviewed and discussed with patient certain preventive protocols, quality metrics, and best practice recommendations. A written  personalized care plan for preventive services as well as general preventive health recommendations were provided to patient.     [place], [place]  5/46/2703

## 2018-04-11 NOTE — Progress Notes (Signed)
I have reviewed and agree with note, evaluation, plan.  Memory concerns noted-continue follow-up with neurology  Garret Reddish, MD

## 2018-04-23 ENCOUNTER — Ambulatory Visit: Payer: Medicare Other | Admitting: Psychology

## 2018-04-28 ENCOUNTER — Ambulatory Visit (INDEPENDENT_AMBULATORY_CARE_PROVIDER_SITE_OTHER): Payer: Medicare Other | Admitting: Psychology

## 2018-04-28 DIAGNOSIS — F331 Major depressive disorder, recurrent, moderate: Secondary | ICD-10-CM

## 2018-04-28 DIAGNOSIS — F411 Generalized anxiety disorder: Secondary | ICD-10-CM

## 2018-05-04 DIAGNOSIS — D485 Neoplasm of uncertain behavior of skin: Secondary | ICD-10-CM | POA: Diagnosis not present

## 2018-05-04 DIAGNOSIS — L821 Other seborrheic keratosis: Secondary | ICD-10-CM | POA: Diagnosis not present

## 2018-05-04 DIAGNOSIS — Z85828 Personal history of other malignant neoplasm of skin: Secondary | ICD-10-CM | POA: Diagnosis not present

## 2018-05-04 DIAGNOSIS — D1801 Hemangioma of skin and subcutaneous tissue: Secondary | ICD-10-CM | POA: Diagnosis not present

## 2018-05-04 DIAGNOSIS — Z85820 Personal history of malignant melanoma of skin: Secondary | ICD-10-CM | POA: Diagnosis not present

## 2018-05-04 DIAGNOSIS — D225 Melanocytic nevi of trunk: Secondary | ICD-10-CM | POA: Diagnosis not present

## 2018-05-04 DIAGNOSIS — D0439 Carcinoma in situ of skin of other parts of face: Secondary | ICD-10-CM | POA: Diagnosis not present

## 2018-05-04 DIAGNOSIS — L111 Transient acantholytic dermatosis [Grover]: Secondary | ICD-10-CM | POA: Diagnosis not present

## 2018-05-05 ENCOUNTER — Ambulatory Visit: Payer: Medicare Other | Admitting: Neurology

## 2018-05-05 ENCOUNTER — Encounter: Payer: Self-pay | Admitting: Family Medicine

## 2018-05-05 ENCOUNTER — Encounter

## 2018-05-13 NOTE — Telephone Encounter (Signed)
Please see message . Thank you .

## 2018-05-15 ENCOUNTER — Telehealth: Payer: Self-pay

## 2018-05-15 ENCOUNTER — Telehealth: Payer: Self-pay | Admitting: Neurology

## 2018-05-15 NOTE — Telephone Encounter (Signed)
Patient's wife returning your call. Thanks

## 2018-05-15 NOTE — Telephone Encounter (Signed)
LMOM for pt and wife, Tillie Rung, advising that we are limiting in-person visits at this time.  Asked for return call to the office

## 2018-05-18 ENCOUNTER — Ambulatory Visit: Payer: Self-pay | Admitting: Neurology

## 2018-05-18 NOTE — Telephone Encounter (Signed)
Appointment I was attempting to reschedule, was rescheduled prior to me seeing this phone encounter

## 2018-05-25 ENCOUNTER — Ambulatory Visit: Payer: Medicare Other | Admitting: Psychology

## 2018-05-29 ENCOUNTER — Ambulatory Visit (INDEPENDENT_AMBULATORY_CARE_PROVIDER_SITE_OTHER): Payer: Medicare Other | Admitting: Family Medicine

## 2018-05-29 ENCOUNTER — Encounter: Payer: Self-pay | Admitting: Family Medicine

## 2018-05-29 VITALS — BP 114/63 | HR 50 | Temp 97.3°F | Ht 68.0 in | Wt 158.2 lb

## 2018-05-29 DIAGNOSIS — I10 Essential (primary) hypertension: Secondary | ICD-10-CM | POA: Diagnosis not present

## 2018-05-29 DIAGNOSIS — R609 Edema, unspecified: Secondary | ICD-10-CM

## 2018-05-29 DIAGNOSIS — S99911A Unspecified injury of right ankle, initial encounter: Secondary | ICD-10-CM | POA: Diagnosis not present

## 2018-05-29 NOTE — Patient Instructions (Addendum)
Video visit

## 2018-05-29 NOTE — Assessment & Plan Note (Signed)
Reduce amlodipine to 1.25 mg.  Appears currently on nadolol 20 mg (per medication review from CMA team)-we will continue that dose. They will give [place] report in 10 days and may adjust further depending on any imbalance or lingering edema issues as well as where BP stands

## 2018-05-29 NOTE — Progress Notes (Signed)
Phone [phone]   Subjective:  Virtual visit via Video note  Our team/I connected with Larry Mejia on 05/29/18 at  2:40 PM EDT by a video enabled telemedicine application (webex) and verified that I am speaking with the correct person using two identifiers.  Location patient: [place] Location provider: [facility], office Persons participating in the virtual visit:  patient  Our team/I discussed the limitations of evaluation and management by telemedicine and the availability of in person appointments. In light of current covid-19 pandemic, patient also understands that we are trying to protect them by minimizing in office contact if at all possible.  The patient expressed consent for telemedicine visit and agreed to proceed.   ROS-denies falls outside of the time when he slipped and hurt his ankle back when we have snow.  No chest pain or shortness of breath reported.  He does have increased edema in the ankles but resolves when he wakes up in the morning.  Past Medical History-  Patient Active Problem List   Diagnosis Date Noted  . Vitamin B 12 deficiency 03/29/2016    Priority: High  . Memory loss 03/06/2016    Priority: High  . Paroxysmal atrial fibrillation (HCC)     Priority: High  . History of supraventricular tachycardia     Priority: Medium  . HTN (hypertension)     Priority: Medium  . Essential tremor     Priority: Medium  . History of melanoma 05/31/2008    Priority: Medium  . GERD 07/30/2007    Priority: Medium  . BARRETTS ESOPHAGUS 07/30/2007    Priority: Medium  . Allergic rhinitis 02/09/2014    Priority: Low  . Insomnia 02/09/2014    Priority: Low  . Mitral regurgitation     Priority: Low  . Aortic insufficiency     Priority: Low  . RBBB (right bundle branch block)     Priority: Low  . NEPHROLITHIASIS, HX OF 05/31/2008    Priority: Low  . BPH (benign prostatic hyperplasia) 05/31/2008    Priority: Low  . Chronic pruritic rash in adult  03/24/2018  . Senile purpura ([place]) 03/06/2017  . Voiding difficulty 03/06/2017    Medications- reviewed and updated Current Outpatient Medications  Medication Sig Dispense Refill  . acetaminophen (TYLENOL) 325 MG tablet Take 650 mg by mouth every 6 (six) hours as needed.      Marland Kitchen amLODipine (NORVASC) 2.5 MG tablet Take 1 tablet (2.5 mg total) by mouth daily. 90 tablet 3  . donepezil (ARICEPT) 10 MG tablet Take 1 tablet daily 90 tablet 3  . escitalopram (LEXAPRO) 10 MG tablet Take 1 tablet (10 mg total) by mouth at bedtime. 30 tablet 11  . esomeprazole (NEXIUM) 40 MG capsule Take 40 mg by mouth 2 (two) times daily.     Marland Kitchen loratadine (CLARITIN) 10 MG tablet Take 10 mg by mouth as needed.     . nadolol (CORGARD) 20 MG tablet Take 1 tablet (20 mg total) by mouth daily. 30 tablet 5   Current Facility-Administered Medications  Medication Dose Route Frequency Provider Last Rate Last Dose  . cyanocobalamin ((VITAMIN B-12)) injection 1,000 mcg  1,000 mcg Intramuscular Q30 days Marin Olp, MD   1,000 mcg at 01/20/17 0940     Objective:  BP 114/63   Pulse (!) 50   Temp (!) 97.3 F (36.3 C) (Oral)   Ht 5\' 8"  (1.727 m)   Wt 158 lb 3.2 oz (71.8 kg)   BMI 24.05 kg/m  Gen: NAD, resting comfortably Lungs: nonlabored, normal respiratory rate  Skin: appears dry, no obvious rash Wife shows me a video of his ankles- slight edema noted    Assessment and Plan   # Edema/hypertnsion/right ankle injury S:has noted ankle swelling starting in the late afternoon.  Has noted for 3-4 days. No shortness of breath.   He also seems slightly more unsteady on his feet. Mostly with getting up out of chair- has to get himself balanced. No drop foot.   He admits back when we had snow- he slipped and felt some discomfort at that time.   Other concerns- if he walks for extended period right foot tends to turn out. Also notes left shoulder seems to slouch if stands for prolonged period. He doesn't remember  the situation. No issues while sitting or standing short periods.  A/P: Patient's blood pressure seems to be low normal-given potential orthostatic symptoms as well as edema-we are going to decrease amlodipine to 1.25 mg and they will update me in a week with how he is doing in regards to his unsteadiness as well as edema  In regards to the right ankle injury-I wonder if he sprained his ankle at that time and since he did not have rehabilitation-may be suffering from some decreased proprioception-has not had worsening issues so we opted to simply monitor this-since it is not all the time I am less concerned about stroke so willing to wait until he can discuss with neurology  In regards to the left shoulder slouching down-I do wonder if this could be related to some sort of discomfort he is experiencing though he is a poor historian at present due to his memory issues.  I think getting Dr. Haynes Dage opinion at next neurology visit is reasonable-I do not strongly suspect something like stroke  Future Appointments  Date Time Provider [place]  07/07/2018 11:00 AM Marin Olp, MD [facility]  07/16/2018  9:30 AM Cameron Sprang, MD [facility] None  We may keep visit in May for virtual visit to check him  Lab/Order associations: Essential hypertension  Edema, unspecified type  Injury of right ankle, initial encounter  Return precautions advised.  Garret Reddish, MD

## 2018-05-30 ENCOUNTER — Encounter: Payer: Self-pay | Admitting: Family Medicine

## 2018-06-01 MED ORDER — NADOLOL 20 MG PO TABS: 10.0000 mg | ORAL_TABLET | Freq: Every day | ORAL | 3 refills | Status: DC

## 2018-06-01 NOTE — Telephone Encounter (Signed)
Please see message and advise.  Thank you. ° °

## 2018-06-11 ENCOUNTER — Encounter: Payer: Self-pay | Admitting: Family Medicine

## 2018-07-02 ENCOUNTER — Telehealth: Payer: Self-pay | Admitting: Family Medicine

## 2018-07-02 NOTE — Telephone Encounter (Signed)
Can pt be seen in the office if no F/C/C?  Please advise

## 2018-07-02 NOTE — Telephone Encounter (Signed)
Yes thanks- he can come in the office  Please have patient wear a mask.  Please make sure no fever/chills/cough/sore throat/body aches/anosmia as you mentioned.  Please tell patient he has a moderate risk for complications from [id number]-16 so there is some risk to bring him in the office but we are try to be safe and not have symptomatic patients in the office

## 2018-07-02 NOTE — Telephone Encounter (Signed)
Called patient to set up [place] Virtual visit for his appt for 07/07/18 and wife said that his tremor is getting worse and his ankles are swelling she would rather him come into the office but if not they will do the Virtual Visit. Patient wife would like a call back at either numbers on file just to let her know if it would be possible for him to come in.

## 2018-07-03 NOTE — Telephone Encounter (Signed)
I spoke with patient's wife and informed them both to e=wear mask and to arrive 15 min prior to appointment.  No Covid-19 symptoms.

## 2018-07-07 ENCOUNTER — Ambulatory Visit (INDEPENDENT_AMBULATORY_CARE_PROVIDER_SITE_OTHER): Payer: Medicare Other | Admitting: Family Medicine

## 2018-07-07 ENCOUNTER — Other Ambulatory Visit: Payer: Self-pay

## 2018-07-07 ENCOUNTER — Encounter: Payer: Self-pay | Admitting: Family Medicine

## 2018-07-07 VITALS — BP 118/70 | HR 54 | Temp 98.2°F | Ht 68.0 in | Wt 159.0 lb

## 2018-07-07 DIAGNOSIS — G250 Essential tremor: Secondary | ICD-10-CM | POA: Diagnosis not present

## 2018-07-07 DIAGNOSIS — E538 Deficiency of other specified B group vitamins: Secondary | ICD-10-CM

## 2018-07-07 DIAGNOSIS — I480 Paroxysmal atrial fibrillation: Secondary | ICD-10-CM

## 2018-07-07 DIAGNOSIS — I10 Essential (primary) hypertension: Secondary | ICD-10-CM | POA: Diagnosis not present

## 2018-07-07 LAB — COMPREHENSIVE METABOLIC PANEL
ALT: 12 U/L (ref 0–53)
AST: 16 U/L (ref 0–37)
Albumin: 4.1 g/dL (ref 3.5–5.2)
Alkaline Phosphatase: 56 U/L (ref 39–117)
BUN: 18 mg/dL (ref 6–23)
CO2: 28 mEq/L (ref 19–32)
Calcium: 9.2 mg/dL (ref 8.4–10.5)
Chloride: 106 mEq/L (ref 96–112)
Creatinine, Ser: 1.16 mg/dL (ref 0.40–1.50)
GFR: 60.86 mL/min (ref >60.00)
Glucose, Bld: 81 mg/dL (ref 70–99)
Potassium: 4.3 mEq/L (ref 3.5–5.1)
Sodium: 140 mEq/L (ref 135–145)
Total Bilirubin: 0.5 mg/dL (ref 0.2–1.2)
Total Protein: 6.4 g/dL (ref 6.0–8.3)

## 2018-07-07 LAB — CBC
HCT: 40.1 % (ref 39.0–52.0)
Hemoglobin: 13.6 g/dL (ref 13.0–17.0)
MCHC: 33.8 g/dL (ref 30.0–36.0)
MCV: 94 fl (ref 78.0–100.0)
Platelets: 138 10*3/uL — ABNORMAL LOW (ref 150.0–400.0)
RBC: 4.27 Mil/uL (ref 4.22–5.81)
RDW: 14 % (ref 11.5–15.5)
WBC: 3.2 10*3/uL — ABNORMAL LOW (ref 4.0–10.5)

## 2018-07-07 LAB — LIPID PANEL
Cholesterol: 188 mg/dL (ref 0–200)
HDL: 68.6 mg/dL (ref >39.00)
LDL Cholesterol: 93 mg/dL (ref 0–99)
NonHDL: 119.74
Total CHOL/HDL Ratio: 3
Triglycerides: 132 mg/dL (ref 0.0–149.0)
VLDL: 26.4 mg/dL (ref 0.0–40.0)

## 2018-07-07 LAB — VITAMIN B12: Vitamin B-12: >1515 pg/mL — ABNORMAL HIGH (ref 211–911)

## 2018-07-07 NOTE — Assessment & Plan Note (Addendum)
S: Patient states the tremors seem to be worse.  This has correlated with stopping beta-blockers.  Even off of beta-blockers-his home heart rates can get down into the 40s. Patient is right-hand dominant and the tremor is worse on the right.  He has not been able to write his name in some time.  He states the tremor can be really embarrassing at times. A/P: Discussed with his heart rate being so low I did not think we could restart beta-blocker-if heart rate was higher I would likely stop the amlodipine and restart propranolol since that seemed to be the most effective for him.  We discussed primidone but I am worried about CNS effects- they have an appointment with neurology Dr. Delice Lesch within 10 days and we will get her opinion

## 2018-07-07 NOTE — Assessment & Plan Note (Addendum)
#  hypertension S: controlled on amlodipine 1.25 mg daily.  Home blood pressures were taken most days between April 17 and May 11.  Blood pressure ranged from 118-148/72-80.  Heart rate ranged from 41-58.  Blood pressure likely averaging 130s over 70s with heart rate averaging in the low 50s.  Patient does have some fatigue/low energy and tends to wander take an afternoon nap BP Readings from Last 3 Encounters:  07/07/18 118/70  05/29/18 114/63  04/10/18 118/70  A/P: Reasonably controlled on home numbers.  Also controlled in office today.  Patient would prefer not at home propranolol we were concerned about dropping heart rate.  We opted to continue very low-dose amlodipine for now

## 2018-07-07 NOTE — Assessment & Plan Note (Signed)
S: Remote episode of atrial fibrillation years ago.  No clear recurrence.  Cardiology in the past did not recommend aspirin due to Barrett's esophagus apparently.  They also did not recommend anticoagulation.  He is currently on no rate control if he were to go back in the atrial fibrillation. A/P: Appears stable without recurrence-continue to monitor.  Remain off beta-blocker with how low the heart rate is

## 2018-07-07 NOTE — Patient Instructions (Addendum)
Heart rate right now too low to consider propranolol. I would be interested in Dr. Amparo Bristol opinion on primidone but it looks like fatigue and CNS depression is a concern on this so I want to hold off unless she recommends.   I also want her opinion on the shoulder drooping and foot issue- I dont see those issues today.   Sign DPR at desk for sons so they can contact us if needed on your behalf  Blood pressure looks better- continue amlodipine half tablet of 2.5 mg dose   Please stop by lab before you go If you do not have mychart- we will call you about results within 5 business days of [place] receiving them.  If you have mychart- we will send your results within 3 business days of [place] receiving them.  If abnormal or we want to clarify a result, we will call or mychart you to make sure you receive the message.  If you have questions or concerns or don't hear within 5-7 days, please send [place] a message or call us.

## 2018-07-07 NOTE — Progress Notes (Signed)
Phone [phone]   Subjective:  Larry Mejia is a 78 y.o. year old very pleasant male patient who presents for/with See problem oriented charting ROS- No fever, chills, cough (other than when he is outside and working in dust or pollen), shortness of breath, body aches, sore throat, or loss of taste or smell. Tremor noted   Past Medical History-  Patient Active Problem List   Diagnosis Date Noted   Vitamin B 12 deficiency 03/29/2016    Priority: High   Memory loss 03/06/2016    Priority: High   Paroxysmal atrial fibrillation (HCC)     Priority: High   History of supraventricular tachycardia     Priority: Medium   HTN (hypertension)     Priority: Medium   Essential tremor     Priority: Medium   History of melanoma 05/31/2008    Priority: Medium   GERD 07/30/2007    Priority: Medium   BARRETTS ESOPHAGUS 07/30/2007    Priority: Medium   Allergic rhinitis 02/09/2014    Priority: Low   Insomnia 02/09/2014    Priority: Low   Mitral regurgitation     Priority: Low   Aortic insufficiency     Priority: Low   RBBB (right bundle branch block)     Priority: Low   NEPHROLITHIASIS, HX OF 05/31/2008    Priority: Low   BPH (benign prostatic hyperplasia) 05/31/2008    Priority: Low   Chronic pruritic rash in adult 03/24/2018   Senile purpura ([place]) 03/06/2017   Voiding difficulty 03/06/2017    Medications- reviewed and updated Current Outpatient Medications  Medication Sig Dispense Refill   acetaminophen (TYLENOL) 325 MG tablet Take 650 mg by mouth every 6 (six) hours as needed.       amLODipine (NORVASC) 2.5 MG tablet Take 1 tablet (2.5 mg total) by mouth daily. 90 tablet 3   donepezil (ARICEPT) 10 MG tablet Take 1 tablet daily 90 tablet 3   escitalopram (LEXAPRO) 10 MG tablet Take 1 tablet (10 mg total) by mouth at bedtime. 30 tablet 11   esomeprazole (NEXIUM) 40 MG capsule Take 40 mg by mouth 2 (two) times daily.      loratadine (CLARITIN) 10  MG tablet Take 10 mg by mouth as needed.      triamcinolone cream (KENALOG) 0.1 %      No current facility-administered medications for this visit.      Objective:  BP 118/70 (BP Location: Left Arm, Patient Position: Sitting, Cuff Size: Normal)    Pulse (!) 54    Temp 98.2 F (36.8 C) (Oral)    Ht 5\' 8"  (1.727 m)    Wt 159 lb (72.1 kg)    SpO2 96%    BMI 24.18 kg/m  Gen: NAD, resting comfortably CV: RRR no murmurs rubs or gallops Lungs: CTAB no crackles, wheeze, rhonchi Abdomen: soft/nontender/nondistended Ext: no edema Skin: warm, dry Neuro: Looks to wife for many answers, intention tremor noted bilaterally-worse on the right.  No resting tremor noted today.     Assessment and Plan   #Physical abnormalities S: Family has noted if patient stands for approximately 30 minutes-he develops a droop in his right shoulder which is very noticeable.  Also if he walks for prolonged period-seems to turn the right foot outward. A/P: I am unable to reproduce the physical concerns today.  I told him I could not think of a clear interconnecting cause for these-we can also get Dr. Amparo Bristol opinion at neurology visit and under 10  days.  Essential tremor S: Patient states the tremors seem to be worse.  This has correlated with stopping beta-blockers.  Even off of beta-blockers-his home heart rates can get down into the 40s. Patient is right-hand dominant and the tremor is worse on the right.  He has not been able to write his name in some time.  He states the tremor can be really embarrassing at times. A/P: Discussed with his heart rate being so low I did not think we could restart beta-blocker-if heart rate was higher I would likely stop the amlodipine and restart propranolol since that seemed to be the most effective for him.  We discussed primidone but I am worried about CNS effects- they have an appointment with neurology Dr. Delice Lesch within 10 days and we will get her opinion    HTN  (hypertension) #hypertension S: controlled on amlodipine 1.25 mg daily.  Home blood pressures were taken most days between April 17 and May 11.  Blood pressure ranged from 118-148/72-80.  Heart rate ranged from 41-58.  Blood pressure likely averaging 130s over 70s with heart rate averaging in the low 50s.  Patient does have some fatigue/low energy and tends to wander take an afternoon nap BP Readings from Last 3 Encounters:  07/07/18 118/70  05/29/18 114/63  04/10/18 118/70  A/P: Reasonably controlled on home numbers.  Also controlled in office today.  Patient would prefer not at home propranolol we were concerned about dropping heart rate.  We opted to continue very low-dose amlodipine for now    Paroxysmal atrial fibrillation S: Remote episode of atrial fibrillation years ago.  No clear recurrence.  Cardiology in the past did not recommend aspirin due to Barrett's esophagus apparently.  They also did not recommend anticoagulation.  He is currently on no rate control if he were to go back in the atrial fibrillation. A/P: Appears stable without recurrence-continue to monitor.  Remain off beta-blocker with how low the heart rate is  Other notes: 1.  Occasionally uses Coricidin high blood pressure if gets into coughing fit without working in the yard.  Future Appointments  Date Time Provider [place]  07/16/2018  9:30 AM Cameron Sprang, MD [facility] None  11/30/2018 10:40 AM Marin Olp, MD [facility]   Update labs and opted for about 21-month follow-up. Lab/Order associations: Essential hypertension - Plan: CBC, Comprehensive metabolic panel, Lipid panel  Vitamin B 12 deficiency - Plan: Vitamin B12  Essential tremor  Paroxysmal atrial fibrillation (HCC)  Return precautions advised.  Garret Reddish, MD

## 2018-07-09 ENCOUNTER — Telehealth: Payer: Self-pay | Admitting: Family Medicine

## 2018-07-09 NOTE — Telephone Encounter (Signed)
Pt's wife called regarding his lab results. Call conference in with patient to discuss his results and recommendations.

## 2018-07-09 NOTE — Telephone Encounter (Signed)
See note

## 2018-07-09 NOTE — Telephone Encounter (Signed)
-----   Message from Darral Dash, [place] sent at 07/08/2018  2:28 PM EDT ----- Left a message for patient to call office. CRM created.

## 2018-07-09 NOTE — Telephone Encounter (Signed)
Noted  

## 2018-07-13 ENCOUNTER — Encounter: Payer: Self-pay | Admitting: *Deleted

## 2018-07-14 ENCOUNTER — Telehealth (INDEPENDENT_AMBULATORY_CARE_PROVIDER_SITE_OTHER): Payer: Medicare Other | Admitting: Neurology

## 2018-07-14 ENCOUNTER — Other Ambulatory Visit: Payer: Self-pay

## 2018-07-14 VITALS — BP 143/79 | HR 58 | Temp 98.1°F | Ht 68.0 in | Wt 160.2 lb

## 2018-07-14 DIAGNOSIS — R251 Tremor, unspecified: Secondary | ICD-10-CM

## 2018-07-14 DIAGNOSIS — F419 Anxiety disorder, unspecified: Secondary | ICD-10-CM

## 2018-07-14 DIAGNOSIS — F0391 Unspecified dementia with behavioral disturbance: Secondary | ICD-10-CM

## 2018-07-14 DIAGNOSIS — F03B18 Unspecified dementia, moderate, with other behavioral disturbance: Secondary | ICD-10-CM

## 2018-07-14 MED ORDER — DONEPEZIL HCL 10 MG PO TABS: ORAL_TABLET | ORAL | 3 refills | Status: DC

## 2018-07-14 MED ORDER — ESCITALOPRAM OXALATE 20 MG PO TABS: 20.0000 mg | ORAL_TABLET | Freq: Every day | ORAL | 3 refills | Status: DC

## 2018-07-14 NOTE — Progress Notes (Signed)
Virtual Visit via Video Note The purpose of this virtual visit is to provide medical care while limiting exposure to the novel coronavirus.    Consent was obtained for video visit:  Yes.   Answered questions that patient had about telehealth interaction:  Yes.   I discussed the limitations, risks, security and privacy concerns of performing an evaluation and management service by telemedicine. I also discussed with the patient that there may be a patient responsible charge related to this service. The patient expressed understanding and agreed to proceed.  Pt location: Home Physician Location: office Name of referring provider:  Marin Olp, MD I connected with Margo Common Swails at patients initiation/request on 07/14/2018 at  2:30 PM EDT by video enabled telemedicine application and verified that I am speaking with the correct person using two identifiers. 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 His wife is present during this e-visit to provide additional information. On his last visit, he was having more delusions and hallucinations. I personally reviewed repeat MRI brain with and without contrast done 01/2018 which did not show any acute changes, there was advanced generalized brain atrophy and chronic microvascular disease, progressive from 2 years prior. On his last visit, he also continued to report increased "flustration" unable to do things he used to. He has seen psychologist Dr. Cheryln Manly a few times and likes his visits, his wife states it helps him that he has someone to talk to "that does not judge him." He states his memory is very low, he still gets very easily "flustrated." His wife reports he can get lost in their house, going in the wrong direction. His wife manages finances and medications. He  does not drive. He is taking Donepezil 10mg  every night. He and his wife report occasional episodes where he has very vivid dreams and gotten up confused, one time it took her 20 minutes to get him back to bed to sleep. He has had tremors for several years with some response to propranolol. They were initially concerned this was causing a rash, and have been proven otherwise, however due to bradycardia, he has not been taking beta-blockers. Tremor depends on what he is doing, he states that he is fine using a screwdriver and working with his tools, but he has difficulty with writing and using a fork. He states today it is "steady as can be," but as soon as he writes, tremor starts. His wife is concerned about his right shoulder, she has noticed that in the past 1.5 years, if he would be standing for more than 30 minutes, his right shoulder would droop significantly and he would be lopsided when walking. She has noticed a tendency for his right foot to be pointed out. She states he could not move well because he is in pain, but he does not remember being in pain. She would give him ES Tylenol to help. She thinks it is his back area that is painful. He states he only gets neck and back pain when working in the yard. He denies any focal numbness/tingling, and does not feel any weakness. No bowel/bladder dysfunction. He still has hallucinations and delusions, seeing former workmates at home and talking to them. Mirrors don't bother him as much, but his wife reports that they have covered them up with sheets  and there are no more bedroom mirrors to bother him. He had a fall in the yard recently, no injuries.   History on Initial Assessment 09/04/2016: This is a 78 yo RH man with a history of hypertension, paroxysmal atrial fibrillation, Barrett esophagus, GERD, tremor, with mild cognitive impairment. He feels frustrated because he knows something but "can't get the right key." He searches for answers when asked  questions. He would walk into a room and stop and think what he went to get. It comes to him in a few seconds. He denies getting lost driving, but now has to pull up a picture if he is given instructions. His wife noticed this worsen in the past year. He likes to tinker with things and knows how to fit pieces together, but describes the sensation as "like somebody else if using that file before I could use it." He denies missing medications. He always loses his sunglasses or keys. His wife is in charge of finances. His wife has noticed more irritation if she tries to help him or asks him a follow-up question to help him. She has expressed some concern about driving and states she has taken over some of it. He had a workup for memory loss with his PCP, and was found to have a low B12 level of 71 last January 2018. TSH normal. I personally reviewed MRI brain with and without contrast which did not show any acute changes. There was generalized volume loss, mild to moderate chronic microvascular disease. He has been on B12 injections since then and has not noticed much change in cognition. Most recent B12 level yesterday was 322. He is also concerned about long-term Nexium use contributing/causing memory issues. He has had tremors in both hands L>R for the past year, more with action but it does not affect eating or writing. Propranolol may be helping some. It only bothers him when he picks up a glass and moves it horizontally, he denies dropping things. He denies any headaches, dizziness, diplopia, dysarthria/dysphagia, neck/back pain, focal numbness/tingling/weakness, bowel/bladder dysfunction, anosmia. His mother and paternal uncle had memory issues. He denies any significant head injuries. He drinks 1 glass of wine a day. They deny any difficulties with ADLs, no hallucinations or paranoia    Current Outpatient Medications on File Prior to Visit  Medication Sig Dispense Refill   acetaminophen (TYLENOL) 325 MG  tablet Take 650 mg by mouth every 6 (six) hours as needed.       amLODipine (NORVASC) 2.5 MG tablet Take 1 tablet (2.5 mg total) by mouth daily. (Patient taking differently: Take 2.5 mg by mouth daily. Half tab daily) 90 tablet 3   donepezil (ARICEPT) 10 MG tablet Take 1 tablet daily 90 tablet 3   escitalopram (LEXAPRO) 10 MG tablet Take 1 tablet (10 mg total) by mouth at bedtime. 30 tablet 11   esomeprazole (NEXIUM) 40 MG capsule Take 40 mg by mouth 2 (two) times daily.      loratadine (CLARITIN) 10 MG tablet Take 10 mg by mouth as needed.      triamcinolone cream (KENALOG) 0.1 %      No current facility-administered medications on file prior to visit.     Observations/Objective:   GEN:  The patient appears stated age and is in NAD. Neurological examination: Patient is awake, alert, oriented to person, city, state. No aphasia or dysarthria. Decreased fluency. Able to follow commands. Remote and recent memory impaired. Able to name and repeat. Cranial nerves: Extraocular movements intact  with no nystagmus. No facial asymmetry. Motor: moves all extremities symmetrically, at least anti-gravity x 4. Unable to do formal motor testing but no clear weakness on right upper extremity seen. No incoordination on finger to nose testing. Gait: slightly wide-based. He has a new right hand tremor with ambulation not present on last visit.   Movement examination: Tone: unable Abnormal movements: He has a new right hand tremor on ambulation. There is no postural or endpoint tremor on video today, but he has a tremor with writing. Coordination:  There is no decremation with RAM's, finger taps  MMSE - Mini Mental State Exam 07/14/2018 04/10/2018 09/12/2017  Not completed: - - -  Orientation to time 0 0 2  Orientation to Place 4 2 5   Registration 3 3 3   Attention/ Calculation 2 5 4   Recall 0 0 0  Language- name 2 objects 2 2 2   Language- repeat 1 1 1   Language- follow 3 step command 3 3 2   Language-  read & follow direction 1 1 1   Write a sentence 0 1 1  Write a sentence-comments - Sentence hard to read due to tremor -  Copy design 0 0 1  Total score 16 18 22     Assessment and Plan:   This is a 78 yo RH man with a history of  hypertension, paroxysmal atrial fibrillation, Barrett esophagus, GERD, tremor, with moderate dementia with behavioral changes. MMSE today 16/30 (18/30 in February 2020, 22/30 in May 2019). MRI brain shows advanced diffuse atrophy and chronic microvascular disease. He continues to deal with a lot of anxiety, we discussed increasing Lexapro to 20mg  daily. He is having vivid dreams which may be due to Donepezil, he will try taking it in the morning. He is having more tremors off Propranolol (bradycardia), however there are new changes today with right hand tremor on ambulation. No clear bradykinesia or rigidity. We discussed other medications used for essential tremor, including potential side effects of primidone and gabapentin. We have agreed to hold off for now and re-evaluate in 3 months. We again discussed the importance of control of vascular risk factors, physical exercise, and brain stimulation exercises for brain health. He will follow-up in 3 months and knows to call for any changes.   Follow Up Instructions:   -I discussed the assessment and treatment plan with the patient. The patient was provided an opportunity to ask questions and all were answered. The patient agreed with the plan and demonstrated an understanding of the instructions.   The patient was advised to call back or seek an in-person evaluation if the symptoms worsen or if the condition fails to improve as anticipated.    Cameron Sprang, MD

## 2018-07-16 ENCOUNTER — Ambulatory Visit: Payer: Self-pay | Admitting: Neurology

## 2018-07-30 DIAGNOSIS — H5203 Hypermetropia, bilateral: Secondary | ICD-10-CM | POA: Diagnosis not present

## 2018-07-30 DIAGNOSIS — H43813 Vitreous degeneration, bilateral: Secondary | ICD-10-CM | POA: Diagnosis not present

## 2018-07-30 DIAGNOSIS — H04121 Dry eye syndrome of right lacrimal gland: Secondary | ICD-10-CM | POA: Diagnosis not present

## 2018-07-30 DIAGNOSIS — G43001 Migraine without aura, not intractable, with status migrainosus: Secondary | ICD-10-CM | POA: Diagnosis not present

## 2018-07-30 DIAGNOSIS — Z961 Presence of intraocular lens: Secondary | ICD-10-CM | POA: Diagnosis not present

## 2018-07-30 DIAGNOSIS — H52223 Regular astigmatism, bilateral: Secondary | ICD-10-CM | POA: Diagnosis not present

## 2018-07-30 DIAGNOSIS — H524 Presbyopia: Secondary | ICD-10-CM | POA: Diagnosis not present

## 2018-08-19 ENCOUNTER — Telehealth: Payer: Self-pay | Admitting: Neurology

## 2018-08-19 NOTE — Telephone Encounter (Signed)
Son Aaron Edelman called in about patient and then the mom is in the hospital. She is the main caretaker of patient. He was wanting to ask about some paperwork for the situation. Please call him back at [phone]. Thanks!

## 2018-08-20 ENCOUNTER — Encounter: Payer: Self-pay | Admitting: Family Medicine

## 2018-08-20 ENCOUNTER — Telehealth: Payer: Self-pay

## 2018-08-20 ENCOUNTER — Ambulatory Visit (INDEPENDENT_AMBULATORY_CARE_PROVIDER_SITE_OTHER): Payer: Medicare Other | Admitting: Family Medicine

## 2018-08-20 VITALS — BP 134/68 | HR 54 | Temp 98.2°F | Resp 14 | Ht 68.0 in | Wt 155.4 lb

## 2018-08-20 DIAGNOSIS — N3000 Acute cystitis without hematuria: Secondary | ICD-10-CM

## 2018-08-20 DIAGNOSIS — N401 Enlarged prostate with lower urinary tract symptoms: Secondary | ICD-10-CM

## 2018-08-20 DIAGNOSIS — R399 Unspecified symptoms and signs involving the genitourinary system: Secondary | ICD-10-CM | POA: Diagnosis not present

## 2018-08-20 LAB — POCT URINALYSIS DIPSTICK
Bilirubin, UA: NEGATIVE
Blood, UA: POSITIVE
Glucose, UA: NEGATIVE
Ketones, UA: NEGATIVE
Nitrite, UA: POSITIVE
Protein, UA: POSITIVE — AB
Spec Grav, UA: >=1.03 — AB (ref 1.010–1.025)
Urobilinogen, UA: 0.2 E.U./dL
pH, UA: 6 (ref 5.0–8.0)

## 2018-08-20 MED ORDER — CIPROFLOXACIN HCL 500 MG PO TABS: 500.0000 mg | ORAL_TABLET | Freq: Two times a day (BID) | ORAL | 0 refills | Status: AC

## 2018-08-20 NOTE — Progress Notes (Signed)
Subjective  CC:  Chief Complaint  Patient presents with  . Urinary Tract Infection    Started a few days ago buring with urination, frequency, and confusion   Same day acute visit; PCP not available. New pt to me. Chart reviewed.   HPI: Larry Mejia is a 78 y.o. male who presents to the office today to address the problems listed above in the chief complaint.  78 year old male with history of cognitive impairment/dementia currently undergoing evaluation presents with his son due to symptoms of urinary tract infection.  Patient reports that over the last 1 to 2 weeks he has had intermittent urinary incontinence, urgency and dysuria.  He denies gross hematuria, nausea, vomiting, change in appetite or flank pain.  He has had no renal colic symptoms in spite of his history of nephrolithiasis.  He does have history of BPH.  He and his son report that his confusion has been progressive in nature over the last several months but not worse over the last weeks.  He has had no fevers or chills.  Lab Results  Component Value Date   CREATININE 1.16 07/07/2018   BUN 18 07/07/2018   NA 140 07/07/2018   K 4.3 07/07/2018   CL 106 07/07/2018   CO2 28 07/07/2018    Assessment  1. Acute cystitis without hematuria   2. UTI symptoms   3. Benign prostatic hyperplasia with lower urinary tract symptoms, symptom details unspecified      Plan   Acute cystitis: Likely obstructive due to BPH.  No current complications noted.  Cipro 500 twice daily for 7 days and urine culture ordered.  Education given to both patient and his son.  He is to follow-up with me next week if symptoms are not significantly improved.  Patient and son understand and agree with care plan.  Follow up: If symptoms do not improve.  Otherwise keep follow-up with PCP: 10/06/2018  Orders Placed This Encounter  Procedures  . Urine Culture  . POCT urinalysis dipstick   Meds ordered this encounter  Medications  . ciprofloxacin  (CIPRO) 500 MG tablet    Sig: Take 1 tablet (500 mg total) by mouth 2 (two) times daily for 7 days.    Dispense:  14 tablet    Refill:  0      I reviewed the patients updated PMH, FH, and SocHx.    Patient Active Problem List   Diagnosis Date Noted  . Chronic pruritic rash in adult 03/24/2018  . Senile purpura ([place]) 03/06/2017  . Voiding difficulty 03/06/2017  . Vitamin B 12 deficiency 03/29/2016  . Memory loss 03/06/2016  . Allergic rhinitis 02/09/2014  . Insomnia 02/09/2014  . Paroxysmal atrial fibrillation (HCC)   . Mitral regurgitation   . Aortic insufficiency   . History of supraventricular tachycardia   . HTN (hypertension)   . RBBB (right bundle branch block)   . Essential tremor   . History of melanoma 05/31/2008  . NEPHROLITHIASIS, HX OF 05/31/2008  . BPH (benign prostatic hyperplasia) 05/31/2008  . GERD 07/30/2007  . Barrett's esophagus with dysplasia 07/30/2007   Current Meds  Medication Sig  . acetaminophen (TYLENOL) 325 MG tablet Take 650 mg by mouth every 6 (six) hours as needed.    Marland Kitchen amLODipine (NORVASC) 2.5 MG tablet Take 1 tablet (2.5 mg total) by mouth daily. (Patient taking differently: Take 2.5 mg by mouth daily. Half tab daily)  . donepezil (ARICEPT) 10 MG tablet Take 1 tablet every morning  .  escitalopram (LEXAPRO) 20 MG tablet Take 1 tablet (20 mg total) by mouth daily.  Marland Kitchen esomeprazole (NEXIUM) 40 MG capsule Take 40 mg by mouth 2 (two) times daily.   Marland Kitchen loratadine (CLARITIN) 10 MG tablet Take 10 mg by mouth as needed.   . triamcinolone cream (KENALOG) 0.1 %     Allergies: Patient is allergic to iohexol. Family History: Patient family history includes Atrial fibrillation in his sister; Colon cancer in his paternal grandfather; Colon cancer (age of onset: 74) in his father; Stroke (age of onset: 10) in his father. Social History:  Patient  reports that he quit smoking about 45 years ago. His smoking use included cigarettes. He has a 1.50 pack-year  smoking history. He has never used smokeless tobacco. He reports previous alcohol use. He reports that he does not use drugs.  Review of Systems: Constitutional: Negative for fever malaise or anorexia Cardiovascular: negative for chest pain Respiratory: negative for SOB or persistent cough Gastrointestinal: negative for abdominal pain  Objective  Vitals: BP 134/68   Pulse (!) 54   Temp 98.2 F (36.8 C) (Oral)   Resp 14   Ht 5\' 8"  (1.727 m)   Wt 155 lb 6.4 oz (70.5 kg)   SpO2 99%   BMI 23.63 kg/m  General: no acute distress , A&Ox3 HEENT: PEERL, conjunctiva normal, Oropharynx moist,neck is supple Cardiovascular:  RRR without murmur or gallop.  Respiratory:  Good breath sounds bilaterally, CTAB with normal respiratory effort Gastrointestinal: soft, flat abdomen, normal active bowel sounds, no palpable masses, no hepatosplenomegaly, no appreciated hernias No CVA tenderness, no suprapubic tenderness Skin:  Warm, no rashes Neuro: Essential tremor present  Office Visit on 08/20/2018  Component Date Value Ref Range Status  . Color, UA 08/20/2018 Yellow   Final  . Clarity, UA 08/20/2018 Cloudy   Final  . Glucose, UA 08/20/2018 Negative  Negative Final  . Bilirubin, UA 08/20/2018 Negative   Final  . Ketones, UA 08/20/2018 Negative   Final  . Spec Grav, UA 08/20/2018 >=1.030* 1.010 - 1.025 Final  . Blood, UA 08/20/2018 Positive   Final  . pH, UA 08/20/2018 6.0  5.0 - 8.0 Final  . Protein, UA 08/20/2018 Positive* Negative Final  . Urobilinogen, UA 08/20/2018 0.2  0.2 or 1.0 E.U./dL Final  . Nitrite, UA 08/20/2018 Positive   Final  . Leukocytes, UA 08/20/2018 Moderate (2+)* Negative Final      Commons side effects, risks, benefits, and alternatives for medications and treatment plan prescribed today were discussed, and the patient expressed understanding of the given instructions. Patient is instructed to call or message via MyChart if he/she has any questions or concerns regarding  our treatment plan. No barriers to understanding were identified. We discussed Red Flag symptoms and signs in detail. Patient expressed understanding regarding what to do in case of urgent or emergency type symptoms.   Medication list was reconciled, printed and provided to the patient in AVS. Patient instructions and summary information was reviewed with the patient as documented in the AVS. This note was prepared with assistance of Dragon voice recognition software. Occasional wrong-word or sound-a-like substitutions may have occurred due to the inherent limitations of voice recognition software

## 2018-08-20 NOTE — Patient Instructions (Signed)
Please return if symptoms do not improve.   If you have any questions or concerns, please don't hesitate to send me a message via MyChart or call the office at [phone]. Thank you for visiting with [place] today! It's our pleasure caring for you.    Urinary Tract Infection, Adult  A urinary tract infection (UTI) is an infection of any part of the urinary tract. The urinary tract includes the kidneys, ureters, bladder, and urethra. These organs make, store, and get rid of urine in the body. Your health care provider may use other names to describe the infection. An upper UTI affects the ureters and kidneys (pyelonephritis). A lower UTI affects the bladder (cystitis) and urethra (urethritis). What are the causes? Most urinary tract infections are caused by bacteria in your genital area, around the entrance to your urinary tract (urethra). These bacteria grow and cause inflammation of your urinary tract. What increases the risk? You are more likely to develop this condition if:  You have a urinary catheter that stays in place (indwelling).  You are not able to control when you urinate or have a bowel movement (you have incontinence).  You are male and you: ? Use a spermicide or diaphragm for birth control. ? Have low estrogen levels. ? Are pregnant.  You have certain genes that increase your risk (genetics).  You are sexually active.  You take antibiotic medicines.  You have a condition that causes your flow of urine to slow down, such as: ? An enlarged prostate, if you are male. ? Blockage in your urethra (stricture). ? A kidney stone. ? A nerve condition that affects your bladder control (neurogenic bladder). ? Not getting enough to drink, or not urinating often.  You have certain medical conditions, such as: ? Diabetes. ? A weak disease-fighting system (immunesystem). ? Sickle cell disease. ? Gout. ? Spinal cord injury. What are the signs or symptoms? Symptoms of this  condition include:  Needing to urinate right away (urgently).  Frequent urination or passing small amounts of urine frequently.  Pain or burning with urination.  Blood in the urine.  Urine that smells bad or unusual.  Trouble urinating.  Cloudy urine.  Vaginal discharge, if you are male.  Pain in the abdomen or the lower back. You may also have:  Vomiting or a decreased appetite.  Confusion.  Irritability or tiredness.  A fever.  Diarrhea. The first symptom in older adults may be confusion. In some cases, they may not have any symptoms until the infection has worsened. How is this diagnosed? This condition is diagnosed based on your medical history and a physical exam. You may also have other tests, including:  Urine tests.  Blood tests.  Tests for sexually transmitted infections (STIs). If you have had more than one UTI, a cystoscopy or imaging studies may be done to determine the cause of the infections. How is this treated? Treatment for this condition includes:  Antibiotic medicine.  Over-the-counter medicines to treat discomfort.  Drinking enough water to stay hydrated. If you have frequent infections or have other conditions such as a kidney stone, you may need to see a health care provider who specializes in the urinary tract (urologist). In rare cases, urinary tract infections can cause sepsis. Sepsis is a life-threatening condition that occurs when the body responds to an infection. Sepsis is treated in the hospital with IV antibiotics, fluids, and other medicines. Follow these instructions at home:  Medicines  Take over-the-counter and prescription medicines only as  told by your health care provider.  If you were prescribed an antibiotic medicine, take it as told by your health care provider. Do not stop using the antibiotic even if you start to feel better. General instructions  Make sure you: ? Empty your bladder often and completely. Do not  hold urine for long periods of time. ? Empty your bladder after sex. ? Wipe from front to back after a bowel movement if you are male. Use each tissue one time when you wipe.  Drink enough fluid to keep your urine pale yellow.  Keep all follow-up visits as told by your health care provider. This is important. Contact a health care provider if:  Your symptoms do not get better after 1-2 days.  Your symptoms go away and then return. Get help right away if you have:  Severe pain in your back or your lower abdomen.  A fever.  Nausea or vomiting. Summary  A urinary tract infection (UTI) is an infection of any part of the urinary tract, which includes the kidneys, ureters, bladder, and urethra.  Most urinary tract infections are caused by bacteria in your genital area, around the entrance to your urinary tract (urethra).  Treatment for this condition often includes antibiotic medicines.  If you were prescribed an antibiotic medicine, take it as told by your health care provider. Do not stop using the antibiotic even if you start to feel better.  Keep all follow-up visits as told by your health care provider. This is important. This information is not intended to replace advice given to you by your health care provider. Make sure you discuss any questions you have with your health care provider. Document Released: 11/21/2004 Document Revised: 08/21/2017 Document Reviewed: 08/21/2017 Elsevier Interactive Patient Education  2019 [employer].

## 2018-08-20 NOTE — Telephone Encounter (Signed)
Spoke with pt son Aaron Edelman he is asking for a letter about his dads diagnosis  To help him and his brother with taken over his care because his mom who is Mr [employer]zog care giver is in the hospital and is not doing well, Pt son was asking for a competency letter he was given the number to the geriatrics consulting services for that. Care giver Packet also places in the mail for him to have  Also the advance directive information mailed.

## 2018-08-20 NOTE — Telephone Encounter (Signed)
Spoke with pt son Larry Mejia he is asking for a letter about his dads diagnosis  To help him and his brother with taken over his care because his mom who is Mr Larry Mejia care giver is in the hospital and is not doing well, Pt son was asking for a competency letter he was given the number to the geriatrics consulting services for that. Care giver Packet also places in the mail for him to have  Also the advance directive information mailed.

## 2018-08-20 NOTE — Telephone Encounter (Signed)
Copied from [place] [id number]. Topic: General - Other >> Aug 20, 2018 11:03 AM Carolyn Stare wrote: Pt son Aaron Edelman is asking for a call back from Dr Yong Channel nurse asking for a letter stating that pt is not able to make decisions, he said his mom is POA but she is in the hospital

## 2018-08-21 ENCOUNTER — Encounter: Payer: Self-pay | Admitting: Neurology

## 2018-08-21 NOTE — Telephone Encounter (Signed)
Letter done, thanks.

## 2018-08-21 NOTE — Telephone Encounter (Signed)
Pt son Aaron Edelman called and informed that Letter is ready he stated that he will come by Monday and pick it up from the front desk

## 2018-08-22 LAB — URINE CULTURE
MICRO NUMBER:: 607542
SPECIMEN QUALITY:: ADEQUATE

## 2018-08-24 NOTE — Progress Notes (Signed)
Please call patient: I have reviewed his/her lab results. Pt's urine did confirm a UTI; it is responsive to the antibiotic he was given. He should be improving.

## 2018-08-27 ENCOUNTER — Telehealth: Payer: Self-pay

## 2018-08-27 NOTE — Telephone Encounter (Signed)
Copied from [place] [phone]. Topic: General - Inquiry >> Aug 27, 2018  3:08 PM Alanda Slim E wrote: Reason for CRM: Pt's son Xiong Haidar) called and they are trying to transition the Pt to the memory center or [facility] facility and will be in need of an Fl2 form for the facility the chose. Please call Aaron Edelman to go over the form and any other info / please advise

## 2018-08-31 NOTE — Telephone Encounter (Signed)
See note

## 2018-08-31 NOTE — Telephone Encounter (Signed)
Pt son Aaron Edelman called back for an update on the FL2 form.Aaron Edelman requests call back at [phone]

## 2018-08-31 NOTE — Telephone Encounter (Signed)
Spoke with Aaron Edelman (on DPR) and set up virtual visit for [facility] form completion. He will e-mail the forms to the office.

## 2018-09-01 NOTE — Telephone Encounter (Signed)
Forms received.  

## 2018-09-02 NOTE — Progress Notes (Signed)
Phone [phone]   Subjective:  Virtual visit via Video note. Chief complaint: Chief Complaint  Patient presents with  . Hypertension    This visit type was conducted due to national recommendations for restrictions regarding the COVID-19 Pandemic (e.g. social distancing).  This format is felt to be most appropriate for this patient at this time balancing risks to patient and risks to population by having him in for in person visit.  No physical exam was performed (except for noted visual exam or audio findings with Telehealth visits).    Our team/I connected with Larry Mejia at  1:40 PM EDT by a video enabled telemedicine application (doxy.me or caregility through epic) and verified that I am speaking with the correct person using two identifiers.  Location patient: Home-[place] Location provider: [facility], office Persons participating in the virtual visit:  patient  Our team/I discussed the limitations of evaluation and management by telemedicine and the availability of in person appointments. In light of current covid-19 pandemic, patient also understands that we are trying to protect them by minimizing in office contact if at all possible.  The patient expressed consent for telemedicine visit and agreed to proceed. Patient understands insurance will be billed.   ROS-no fever or chills reported.  Continued issues with memory.  No chest pain or shortness of breath reported today Past Medical History-  Patient Active Problem List   Diagnosis Date Noted  . Vitamin B 12 deficiency 03/29/2016    Priority: High  . Moderate dementia with behavioral disturbance ([place]) 03/06/2016    Priority: High  . Paroxysmal atrial fibrillation (HCC)     Priority: High  . Chronic pruritic rash in adult 03/24/2018    Priority: Medium  . History of supraventricular tachycardia     Priority: Medium  . HTN (hypertension)     Priority: Medium  . Essential tremor     Priority: Medium  . History of  melanoma 05/31/2008    Priority: Medium  . GERD 07/30/2007    Priority: Medium  . Barrett's esophagus with dysplasia 07/30/2007    Priority: Medium  . Senile purpura ([place]) 03/06/2017    Priority: Low  . Allergic rhinitis 02/09/2014    Priority: Low  . Insomnia 02/09/2014    Priority: Low  . Mitral regurgitation     Priority: Low  . Aortic insufficiency     Priority: Low  . RBBB (right bundle branch block)     Priority: Low  . NEPHROLITHIASIS, HX OF 05/31/2008    Priority: Low  . BPH (benign prostatic hyperplasia) 05/31/2008    Priority: Low  . Voiding difficulty 03/06/2017    Medications- reviewed and updated Current Outpatient Medications  Medication Sig Dispense Refill  . acetaminophen (TYLENOL) 325 MG tablet Take 650 mg by mouth every 6 (six) hours as needed.      Marland Kitchen amLODipine (NORVASC) 2.5 MG tablet Take 1 tablet (2.5 mg total) by mouth daily. (Patient taking differently: Take 2.5 mg by mouth daily. Half tab daily) 90 tablet 3  . donepezil (ARICEPT) 10 MG tablet Take 1 tablet every morning 90 tablet 3  . escitalopram (LEXAPRO) 20 MG tablet Take 1 tablet (20 mg total) by mouth daily. 90 tablet 3  . esomeprazole (NEXIUM) 40 MG capsule Take 40 mg by mouth 2 (two) times daily.     Marland Kitchen loratadine (CLARITIN) 10 MG tablet Take 10 mg by mouth as needed.     . triamcinolone cream (KENALOG) 0.1 %  No current facility-administered medications for this visit.      Objective:  BP 124/78   Pulse 61   Wt 153 lb (69.4 kg)   BMI 23.26 kg/m  self reported vitals Gen: NAD, resting comfortably Lungs: nonlabored, normal respiratory rate  Skin: appears dry, no obvious rash    Assessment and Plan   # social update-patient's wife currently in the hospital-started out with a UTI and later got dehydrated and apparently eventually developed pneumonia-she has been in the hospital for 4 weeks and just moved to rehab.  Patient's wife is his power of attorney.Aaron Edelman and Mortimer Fries his sons are  helping [place] with visit today and helping patient with his affairs in general at the moment  #Transition living- patient will be transitioning to [facility] memory unit due to continued issues with memory and concern wife will not be able to help as much when he gets back home.  Sons are currently providing essentially 24-hour supervision.  We completed an FL 2 form and some supplemental forms for hearing screens.  We also wrote a letter that patient cannot make sound financial decisions on his own.  Patient is clearly very supportive of sons helping him.   Moderate dementia with behavioral disturbance ([place]) S: Patient has moderate dementia with behavioral disturbance.  Patient is on donepezil through Dr. Delice Lesch as well as Lexapro 20 mg.  From Dr. Amparo Bristol last letter "mixed Alzheimer's and vascular type. He has hallucinations and delusions, and needs assistance with all activities of daily living, requiring 24/7 supervision." A/P: Patient is largely stable but with wife not being in the home right now due to illness and concerns for her long-term ability to care for him patient is going to transition into [facility].  Continue current medications and neurology follow-up  HTN (hypertension) S: Well controlled today on amlodipine 1.25 mg A/P: Stable-continue current medication  Paroxysmal atrial fibrillation ([place]) S: One episode remotely when on a monitor.  There is some debate whether this was actually SVT.  Patient is not on aspirin due to history of Barrett's and given no clear recurrence has not been started on anticoagulation- before dementia progressed patient had declined anticoagulation on several occasions A/P: stable without obvious recurrence-continue to monitor.  GERD S: Remains on high-dose PPI Nexium 40 mg twice a day due to history of Barrett's esophagus A/P:  Stable. Continue current medications.   -Last B12 adequately repleted- continue to monitor       Senile purpura ([place]) S: Continued issues with easy bruising.  No issues with bleeding A/P:  Stable. Continue current medications.     Future Appointments  Date Time Provider [place]  09/30/2018  2:00 PM Cameron Sprang, MD [facility] None  10/06/2018  2:40 PM Marin Olp, MD [facility]  11/30/2018 10:40 AM Marin Olp, MD [facility]   Lab/Order associations:   ICD-10-CM   1. Moderate dementia with behavioral disturbance ([place])  F03.91   2. Essential hypertension  I10   3. Paroxysmal atrial fibrillation (HCC)  I48.0   4. Gastroesophageal reflux disease, esophagitis presence not specified  K21.9   5. Senile purpura ([place])  D69.2    Return precautions advised.  Garret Reddish, MD

## 2018-09-03 ENCOUNTER — Encounter: Payer: Self-pay | Admitting: Family Medicine

## 2018-09-03 ENCOUNTER — Ambulatory Visit (INDEPENDENT_AMBULATORY_CARE_PROVIDER_SITE_OTHER): Payer: Medicare Other | Admitting: Family Medicine

## 2018-09-03 ENCOUNTER — Telehealth: Payer: Self-pay | Admitting: *Deleted

## 2018-09-03 ENCOUNTER — Telehealth: Payer: Self-pay | Admitting: Family Medicine

## 2018-09-03 DIAGNOSIS — I10 Essential (primary) hypertension: Secondary | ICD-10-CM

## 2018-09-03 DIAGNOSIS — K219 Gastro-esophageal reflux disease without esophagitis: Secondary | ICD-10-CM

## 2018-09-03 DIAGNOSIS — Z20822 Contact with and (suspected) exposure to covid-19: Secondary | ICD-10-CM

## 2018-09-03 DIAGNOSIS — F0391 Unspecified dementia with behavioral disturbance: Secondary | ICD-10-CM | POA: Diagnosis not present

## 2018-09-03 DIAGNOSIS — I480 Paroxysmal atrial fibrillation: Secondary | ICD-10-CM

## 2018-09-03 DIAGNOSIS — F03B18 Unspecified dementia, moderate, with other behavioral disturbance: Secondary | ICD-10-CM

## 2018-09-03 DIAGNOSIS — D692 Other nonthrombocytopenic purpura: Secondary | ICD-10-CM | POA: Diagnosis not present

## 2018-09-03 NOTE — Assessment & Plan Note (Signed)
S: Remains on high-dose PPI Nexium 40 mg twice a day due to history of Barrett's esophagus A/P:  Stable. Continue current medications.   -Last B12 adequately repleted- continue to monitor

## 2018-09-03 NOTE — Assessment & Plan Note (Signed)
S: Continued issues with easy bruising.  No issues with bleeding A/P:  Stable. Continue current medications.

## 2018-09-03 NOTE — Telephone Encounter (Signed)
See telephone encounter on 09/03/18. Left message on voicemail to return call to schedule testing.

## 2018-09-03 NOTE — Telephone Encounter (Signed)
Patient: Larry Mejia MRN: [id number] DOB: [birth year]  Reason for test: Asymptomatic patients from intrinsically risky/high density environments (moving to [facility] and requires pretest). Son also requesting testing as has to stay with him for 4-5 days at [facility] if at all possible (lives in [place])  Chief Strategy Officer Visit Coverage  Payer Plan Sponsor Code Group Number Group Name  MEDICARE MEDICARE PART A AND B     Primary Visit Coverage Subscriber  ID Name SSN Address  [phone] [name], [name]     [place], [place] [address]  Secondary Visit Coverage  Payer Plan Sponsor Code Group Number Group Name  [place]  Secondary Visit Coverage Subscriber  ID Name SSN Address  [id number] [name],[name] [id number] [place]

## 2018-09-03 NOTE — Assessment & Plan Note (Addendum)
S: One episode remotely when on a monitor.  There is some debate whether this was actually SVT.  Patient is not on aspirin due to history of Barrett's and given no clear recurrence has not been started on anticoagulation- before dementia progressed patient had declined anticoagulation on several occasions A/P: stable without obvious recurrence-continue to monitor.

## 2018-09-03 NOTE — Assessment & Plan Note (Signed)
S: Patient has moderate dementia with behavioral disturbance.  Patient is on donepezil through Dr. Delice Lesch as well as Lexapro 20 mg.  From Dr. Amparo Bristol last letter "mixed Alzheimer's and vascular type. He has hallucinations and delusions, and needs assistance with all activities of daily living, requiring 24/7 supervision." A/P: Patient is largely stable but with wife not being in the home right now due to illness and concerns for her long-term ability to care for him patient is going to transition into [facility].  Continue current medications and neurology follow-up

## 2018-09-03 NOTE — Assessment & Plan Note (Signed)
S: Well controlled today on amlodipine 1.25 mg A/P: Stable-continue current medication

## 2018-09-03 NOTE — Telephone Encounter (Signed)
Marin Olp, MD      Patient: Larry Mejia MRN: [id number] DOB: 10/22/1940  Reason for test: Asymptomatic patients from intrinsically risky/high density environments (moving to [facility] and requires pretest). Son also requesting testing as has to stay with him for 4-5 days at [facility] if at all possible (lives in [place])  Insurance      Pt called and left message to return call to schedule COVID-19 testing. Order placed.

## 2018-09-04 ENCOUNTER — Telehealth: Payer: Self-pay | Admitting: *Deleted

## 2018-09-04 NOTE — Telephone Encounter (Signed)
Attempted to reach son, no answer. Left vm to call back

## 2018-09-04 NOTE — Telephone Encounter (Signed)
Please call patient son Larry Mejia cell to schedule appt.Larry Mejia is on [place]

## 2018-09-04 NOTE — Telephone Encounter (Signed)
Notified pt's son, and scheduled covid-19 test for Monday July 13 th at 10:15 at [facility]. Advised that this is a drive thru testing site. So he will need to stay in the car, with windows rolled up an mask on until ready for testing. He voiced understanding. Also scheduled the son's appointment.

## 2018-09-04 NOTE — Telephone Encounter (Signed)
Attempted to reach pt, no answer. Left vm to call back  

## 2018-09-07 ENCOUNTER — Other Ambulatory Visit: Payer: Self-pay

## 2018-09-07 DIAGNOSIS — Z20822 Contact with and (suspected) exposure to covid-19: Secondary | ICD-10-CM

## 2018-09-07 DIAGNOSIS — R6889 Other general symptoms and signs: Secondary | ICD-10-CM | POA: Diagnosis not present

## 2018-09-11 LAB — NOVEL CORONAVIRUS, NAA: SARS-CoV-2, NAA: NOT DETECTED

## 2018-09-15 ENCOUNTER — Encounter: Payer: Self-pay | Admitting: Family Medicine

## 2018-09-15 NOTE — Patient Instructions (Addendum)
Try half tablet trazodone before bed  Like your idea of seeing Dr. Cheryln Manly  Stay off nadolol unless tremor worsens  Monitor hernia- if worsening pain pattern please let us know or if has severe pain lasting more than 30 minutes or so- seek care immediately   Team- see if Marcene Brawn can help get proxy account set up for [employer]

## 2018-09-15 NOTE — Progress Notes (Signed)
Phone [phone]   Subjective:  Larry Mejia is a 78 y.o. year old very pleasant male patient who presents for/with See problem oriented charting Chief Complaint  Patient presents with  . Dementia  . Hernia  . Rash  . Tremors  . Barrett's Esophagus   ROS- no fever/chills/sore throat/cough   Past Medical History-  Patient Active Problem List   Diagnosis Date Noted  . Vitamin B 12 deficiency 03/29/2016    Priority: High  . Moderate dementia with behavioral disturbance ([place]) 03/06/2016    Priority: High  . Paroxysmal atrial fibrillation (HCC)     Priority: High  . Chronic pruritic rash in adult 03/24/2018    Priority: Medium  . History of supraventricular tachycardia     Priority: Medium  . HTN (hypertension)     Priority: Medium  . Essential tremor     Priority: Medium  . History of melanoma 05/31/2008    Priority: Medium  . GERD 07/30/2007    Priority: Medium  . Barrett's esophagus with dysplasia 07/30/2007    Priority: Medium  . Senile purpura ([place]) 03/06/2017    Priority: Low  . Allergic rhinitis 02/09/2014    Priority: Low  . Insomnia 02/09/2014    Priority: Low  . Mitral regurgitation     Priority: Low  . Aortic insufficiency     Priority: Low  . RBBB (right bundle branch block)     Priority: Low  . NEPHROLITHIASIS, HX OF 05/31/2008    Priority: Low  . BPH (benign prostatic hyperplasia) 05/31/2008    Priority: Low  . Voiding difficulty 03/06/2017    Medications- reviewed and updated Current Outpatient Medications  Medication Sig Dispense Refill  . acetaminophen (TYLENOL) 325 MG tablet Take 650 mg by mouth every 6 (six) hours as needed.      Marland Kitchen amLODipine (NORVASC) 2.5 MG tablet Take 1 tablet (2.5 mg total) by mouth daily. (Patient taking differently: Take 2.5 mg by mouth daily. Half tab daily) 90 tablet 3  . Cyanocobalamin (B-12 PO) Take by mouth.    . donepezil (ARICEPT) 10 MG tablet Take 1 tablet every morning 90 tablet 3  . escitalopram  (LEXAPRO) 20 MG tablet Take 1 tablet (20 mg total) by mouth daily. 90 tablet 3  . esomeprazole (NEXIUM) 40 MG capsule Take 40 mg by mouth 2 (two) times daily.     Marland Kitchen loratadine (CLARITIN) 10 MG tablet Take 10 mg by mouth as needed.     . triamcinolone cream (KENALOG) 0.1 %     . traZODone (DESYREL) 50 MG tablet Take 0.5-1 tablets (25-50 mg total) by mouth at bedtime as needed for sleep. 30 tablet 3   No current facility-administered medications for this visit.      Objective:  BP 118/72 (BP Location: Left Arm, Patient Position: Sitting, Cuff Size: Normal)   Pulse (!) 53   Temp 98.1 F (36.7 C) (Oral)   Ht 5\' 8"  (1.727 m)   Wt 154 lb 12.8 oz (70.2 kg)   SpO2 96%   BMI 23.54 kg/m  Gen: NAD, resting comfortably CV: RRR but slightly bradycardic Lungs: nonlabored, normal respiratory rate Abdomen: soft/nondistended Ext: no edema Skin: warm, dry Groin- slight nontender bulge in left groin Intention tremor noted    Assessment and Plan   #Significant life change- wife died unexpectedly earlier this month  # Dementia/insomnia/mourning S:Followed by Dr. Delice Lesch, last visit 07/14/18.  Taking Donepezil 10 mg daily and Escitalopram 20 mg daily. Is son is concerned because  he has not been sleeping- up wondering 3x a night.  He will wake up in the middle of the night and his dad will be fully dressed wondering around with a flash light. Son would like to discuss possibility of moving his dad to [facility].   Big recent stressor-patient's wife passed on 14-Jun-2022 of last week. Son has temporarily moved in right now. Son teaches at [employer] and can teach remotely. Other son and him have been rotating. Son now [place].  The problem is if he is admitted at this point they may not have good contact with him due to COVID-19 and they think their father really needs them at this point with recent loss of mother.  Patient has seen Dr. Cheryln Manly in the past and son wants to help him set up some follow-up  visits A/P: Family would like to delay him moving into [facility] for at least the next few months to see how he does-I think that is very reasonable given he is going through the mourning process right now and this would allow more contact with his sons which may help his healing and there is. - For insomnia we will trial trazodone 25 mg-hopeful helpful at this dose and does not cause confusion -They asked my opinion about family gathering after loss of patient's wife- ideally masks would be worn and 6 feet social distancing would be practiced and this would be outside-they are considering this option.  On the other hand may be difficult to not have some hugs and this may actually help with the healing process for patient-I discussed with them that ultimately they have to balance the benefits versus the risks-there is some real risk for contracting coronavirus -Continue Lexapro- PHQ 9 mildly elevated 2 weeks ago and suspect it would be worse if check today due to mourning process- we will reevaluate at least every 4 months.  No suicidal ideation reported.  # Hernia S:C/o pain in LLQ that is intermittent. He has noticed raised area in the LLQ. Sx worse when lifting objects.   A/P: Left groin hernia- mild- monitor for worsening pain.  I think this would be an incredibly tough time for him to have surgery so we opted to defer  # Rash S:Rash on right side of chest. The rash does itch. He continues to pick at it. They have tried bandaging it but it will not heal.   A/P:  may be irritated seborrheic keratosis but could also be cancerous lesion.  Not aware of picking at it but could wth memory issues.  Has seen in the past- [facility]-son agrees to call to follow-up. vaseline or neosporin and bandaid to help avoid picking advised.  # Tremor S:Pt d/c Nadolol in April 2020, he has not noticed any change with tremor, does not seem to be worsening but is staying consistent.   A/P:  not significantly  worsened off nadolol- may remain off. Can use half or full tablet in future if tremor worsens  # Barrett's Esophagus  S:This has been managed by [facility].  Postponed yearly  And they are calling to rescedule.  A/P: Family asks my opinion-recent visit asked to get pushed back due to covid-19.  I advised trial for 2-3 month follow up as just over a year since last follow-up.   Recommended follow up: Scheduled for August Future Appointments  Date Time Provider [place]  09/30/2018  2:00 PM Cameron Sprang, MD [facility] None  10/06/2018  2:40 PM Yong Channel,  Brayton Mars, MD [facility]  11/30/2018 10:40 AM Yong Channel Brayton Mars, MD [facility]   Lab/Order associations:   ICD-10-CM   1. Essential tremor  G25.0   2. Insomnia, unspecified type  G47.00   3. Mourning  F43.21   4. Barrett's esophagus with dysplasia  K22.719   5. Rash  R21   6. Left groin hernia  K40.90     Meds ordered this encounter  Medications  . traZODone (DESYREL) 50 MG tablet    Sig: Take 0.5-1 tablets (25-50 mg total) by mouth at bedtime as needed for sleep.    Dispense:  30 tablet    Refill:  3   Return precautions advised.  Garret Reddish, MD [facility]

## 2018-09-16 ENCOUNTER — Ambulatory Visit (INDEPENDENT_AMBULATORY_CARE_PROVIDER_SITE_OTHER): Payer: Medicare Other | Admitting: Family Medicine

## 2018-09-16 ENCOUNTER — Telehealth: Payer: Self-pay | Admitting: Family Medicine

## 2018-09-16 ENCOUNTER — Other Ambulatory Visit: Payer: Self-pay

## 2018-09-16 ENCOUNTER — Encounter: Payer: Self-pay | Admitting: Family Medicine

## 2018-09-16 VITALS — BP 118/72 | HR 53 | Temp 98.1°F | Ht 68.0 in | Wt 154.8 lb

## 2018-09-16 DIAGNOSIS — F4321 Adjustment disorder with depressed mood: Secondary | ICD-10-CM | POA: Diagnosis not present

## 2018-09-16 DIAGNOSIS — R21 Rash and other nonspecific skin eruption: Secondary | ICD-10-CM | POA: Diagnosis not present

## 2018-09-16 DIAGNOSIS — K22719 Barrett's esophagus with dysplasia, unspecified: Secondary | ICD-10-CM | POA: Diagnosis not present

## 2018-09-16 DIAGNOSIS — G250 Essential tremor: Secondary | ICD-10-CM

## 2018-09-16 DIAGNOSIS — G4700 Insomnia, unspecified: Secondary | ICD-10-CM | POA: Diagnosis not present

## 2018-09-16 DIAGNOSIS — K4090 Unilateral inguinal hernia, without obstruction or gangrene, not specified as recurrent: Secondary | ICD-10-CM

## 2018-09-16 MED ORDER — TRAZODONE HCL 50 MG PO TABS: 25.0000 mg | ORAL_TABLET | Freq: Every evening | ORAL | 3 refills | Status: DC | PRN

## 2018-09-16 NOTE — Telephone Encounter (Signed)
Son Larry Mejia called and received results

## 2018-09-30 ENCOUNTER — Telehealth (INDEPENDENT_AMBULATORY_CARE_PROVIDER_SITE_OTHER): Payer: Medicare Other | Admitting: Neurology

## 2018-09-30 ENCOUNTER — Other Ambulatory Visit: Payer: Self-pay

## 2018-09-30 ENCOUNTER — Encounter: Payer: Self-pay | Admitting: Neurology

## 2018-09-30 VITALS — Ht 68.0 in | Wt 154.0 lb

## 2018-09-30 DIAGNOSIS — R251 Tremor, unspecified: Secondary | ICD-10-CM

## 2018-09-30 DIAGNOSIS — F03B18 Unspecified dementia, moderate, with other behavioral disturbance: Secondary | ICD-10-CM

## 2018-09-30 DIAGNOSIS — F0391 Unspecified dementia with behavioral disturbance: Secondary | ICD-10-CM | POA: Diagnosis not present

## 2018-09-30 MED ORDER — CARBIDOPA-LEVODOPA 25-100 MG PO TABS: ORAL_TABLET | ORAL | 5 refills | Status: DC

## 2018-09-30 MED ORDER — ESCITALOPRAM OXALATE 20 MG PO TABS: 20.0000 mg | ORAL_TABLET | Freq: Every day | ORAL | 3 refills | Status: DC

## 2018-09-30 MED ORDER — DONEPEZIL HCL 10 MG PO TABS: ORAL_TABLET | ORAL | 3 refills | Status: DC

## 2018-09-30 NOTE — Progress Notes (Signed)
Virtual Visit via Video Note The purpose of this virtual visit is to provide medical care while limiting exposure to the novel coronavirus.    Consent was obtained for video visit:  Yes.   Answered questions that patient had about telehealth interaction:  Yes.   I discussed the limitations, risks, security and privacy concerns of performing an evaluation and management service by telemedicine. I also discussed with the patient that there may be a patient responsible charge related to this service. The patient expressed understanding and agreed to proceed.  Pt location: Home Physician Location: office Name of referring provider:  Marin Olp, MD I connected with Larry Mejia at patients initiation/request on 09/30/2018 at  2:00 PM EDT by video enabled telemedicine application and verified that I am speaking with the correct person using two identifiers. Pt MRN:  [id number] Pt DOB:  07/16/1940 Video Participants:  Larry Mejia;  Denton Lank (son)   History of Present Illness:  The patient was seen as a virtual video visit on 09/30/2018. He was last seen 3 months ago for moderate dementia and tremors. In the interim, his wife and primary caregiver passed away last September 16, 2022. His sons take turns staying with him, he has 24/7 supervision, his son Aaron Edelman moved in recently. Aaron Edelman manages finances and medications. He is not driving. He gets lost inside the house. He does not prepare his meals. He is able to shower himself but dressing himself is a challenge with orientation with his clothing. He is able to shave himself. He still gets very flustered because he cannot do things he used to do or cannot do them right. He is not sleeping well. He continues to have difficulties with tremors, he repeatedly says that the tremor worsens as he is about to put something down. It affects using utensils, eating is messy despite using weighted silverware. Tremor is worse on the right hand but he feels like it  is moving to his left hand. He is having some difficulty swallowing due to esophageal issues. He is on Donepezil 10mg  daily, Lexapro 20mg  daily, and Trazodone at bedtime without side effects. He has been taken off beta-blockers due to bradycardia, in the past he felt it helped with tremors.  History on Initial Assessment 09/04/2016: This is a 78 yo RH man with a history of hypertension, paroxysmal atrial fibrillation, Barrett esophagus, GERD, tremor, with mild cognitive impairment. He feels frustrated because he knows something but "can't get the right key." He searches for answers when asked questions. He would walk into a room and stop and think what he went to get. It comes to him in a few seconds. He denies getting lost driving, but now has to pull up a picture if he is given instructions. His wife noticed this worsen in the past year. He likes to tinker with things and knows how to fit pieces together, but describes the sensation as "like somebody else if using that file before I could use it." He denies missing medications. He always loses his sunglasses or keys. His wife is in charge of finances. His wife has noticed more irritation if she tries to help him or asks him a follow-up question to help him. She has expressed some concern about driving and states she has taken over some of it. He had a workup for memory loss with his PCP, and was found to have a low B12 level of 71 last January 2018. TSH normal. I personally reviewed MRI brain with  and without contrast which did not show any acute changes. There was generalized volume loss, mild to moderate chronic microvascular disease. He has been on B12 injections since then and has not noticed much change in cognition. Most recent B12 level yesterday was 322. He is also concerned about long-term Nexium use contributing/causing memory issues. He has had tremors in both hands L>R for the past year, more with action but it does not affect eating or writing.  Propranolol may be helping some. It only bothers him when he picks up a glass and moves it horizontally, he denies dropping things. He denies any headaches, dizziness, diplopia, dysarthria/dysphagia, neck/back pain, focal numbness/tingling/weakness, bowel/bladder dysfunction, anosmia. His mother and paternal uncle had memory issues. He denies any significant head injuries. He drinks 1 glass of wine a day. They deny any difficulties with ADLs, no hallucinations or paranoia     Current Outpatient Medications on File Prior to Visit  Medication Sig Dispense Refill  . acetaminophen (TYLENOL) 325 MG tablet Take 650 mg by mouth every 6 (six) hours as needed.      Marland Kitchen amLODipine (NORVASC) 2.5 MG tablet Take 1 tablet (2.5 mg total) by mouth daily. (Patient taking differently: Take 2.5 mg by mouth daily. Half tab daily) 90 tablet 3  . Cyanocobalamin (B-12 PO) Take by mouth.    . donepezil (ARICEPT) 10 MG tablet Take 1 tablet every morning 90 tablet 3  . escitalopram (LEXAPRO) 20 MG tablet Take 1 tablet (20 mg total) by mouth daily. 90 tablet 3  . esomeprazole (NEXIUM) 40 MG capsule Take 40 mg by mouth 2 (two) times daily.     . traZODone (DESYREL) 50 MG tablet Take 0.5-1 tablets (25-50 mg total) by mouth at bedtime as needed for sleep. 30 tablet 3   No current facility-administered medications on file prior to visit.      Observations/Objective:   Vitals:   09/30/18 1329  Weight: 154 lb (69.9 kg)  Height: 5\' 8"  (1.727 m)   GEN:  The patient appears stated age and is in NAD.  Neurological examination: Patient is awake, alert, oriented to person, place. No aphasia or dysarthria. Reduced fluency, needs repetition of instructions. Remote and recent memory impaired. Able to name, difficulty with repetition. MMSE - Mini Mental State Exam 09/30/2018 07/14/2018 04/10/2018  Not completed: - - -  Orientation to time 1 0 0  Orientation to Place 3 4 2   Registration 3 3 3   Attention/ Calculation 1 2 5   Recall 0  0 0  Language- name 2 objects 2 2 2   Language- repeat 0 1 1  Language- follow 3 step command 3 3 3   Language- read & follow direction 0 1 1  Write a sentence 0 0 1  Write a sentence-comments - - Sentence hard to read due to tremor  Copy design 0 0 0  Total score 13 16 18    Cranial nerves: Extraocular movements intact with no nystagmus. No facial asymmetry. Motor: moves all extremities symmetrically, at least anti-gravity x 4. No incoordination on finger to nose testing. Gait: narrow-based and steady but again with right hand tremor on ambulation. There is no bradykinesia with finger taps and rapid alternating movements. He has action>postural tremor R>L  Assessment and Plan:   This is a 78 yo RH man with a history of  hypertension, paroxysmal atrial fibrillation, Barrett esophagus, GERD, tremor, with moderate dementia with behavioral changes. MMSE today 13/30 (16/30 in May 2020, 18/30 in February 2020, 22/30 in May  2019). MRI brain shows advanced diffuse atrophy and chronic microvascular disease. His wife unfortunately passed away recently, he is planning to move to [facility], which I encouraged. Continue Donepezil 10mg  daily, Lexapro 20mg  daily. He is having more tremors, he has a history of essential tremor, however there has been a change in symptoms, now with right hand tremor on ambulation. No clear bradykinesia. We discussed a trial of Sinemet for the tremor, side effects discussed, start Sinemet 25/100mg  1/2 tab TID for 1 week, then increase to 1 tab TID with meals. We again discussed the importance of control of vascular risk factors, physical exercise, and brain stimulation exercises for brain health. Continue 24/7 supervision. He does not drive. He will follow-up in 6 months and knows to call for any changes.    Follow Up Instructions:   -I discussed the assessment and treatment plan with the patient/son. The patient/son were provided an opportunity to ask questions and all were  answered. The patient/son agreed with the plan and demonstrated an understanding of the instructions.   The patient was advised to call back or seek an in-person evaluation if the symptoms worsen or if the condition fails to improve as anticipated.   Cameron Sprang, MD

## 2018-10-06 ENCOUNTER — Ambulatory Visit: Payer: Medicare Other | Admitting: Family Medicine

## 2018-10-07 ENCOUNTER — Telehealth: Payer: Self-pay | Admitting: Neurology

## 2018-10-07 NOTE — Telephone Encounter (Signed)
Son Larry Mejia had left VM about wanting to speak about VV last week. He was involved in the VV and wanted to speak with the nurse about the assessment and see what stand point he is at from a clinical perspective. Thanks!

## 2018-10-08 NOTE — Telephone Encounter (Signed)
Son Larry Mejia had concerns about what stage of dementia his dad was in. Did Dr. Delice Lesch see that pt had been declining. Do we feel that memory care is an option?  Moderate dementia, MOCA scores given, Memory care at [facility] excellent idea.

## 2018-10-12 ENCOUNTER — Ambulatory Visit (INDEPENDENT_AMBULATORY_CARE_PROVIDER_SITE_OTHER): Payer: Medicare Other | Admitting: Psychology

## 2018-10-12 DIAGNOSIS — F331 Major depressive disorder, recurrent, moderate: Secondary | ICD-10-CM

## 2018-10-12 DIAGNOSIS — F411 Generalized anxiety disorder: Secondary | ICD-10-CM

## 2018-10-29 DIAGNOSIS — Z03818 Encounter for observation for suspected exposure to other biological agents ruled out: Secondary | ICD-10-CM | POA: Diagnosis not present

## 2018-10-30 ENCOUNTER — Telehealth: Payer: Self-pay | Admitting: Neurology

## 2018-10-30 NOTE — Telephone Encounter (Signed)
Pls give verbal to stop Sinemet, if unable to give verbal, order will be signed on Wed. Thanks

## 2018-10-30 NOTE — Telephone Encounter (Signed)
[facility]ia is wanting to discontinue all the medication for the patient. She said they sent over a fax today that Dr. Delice Lesch needs to sign and fax back on Monday. Family said they have already talked to Dr. Delice Lesch about this. Patient is moving into the facility today/tomorrow. Thanks!

## 2018-10-30 NOTE — Telephone Encounter (Signed)
Informed [facility] that per Dr. Delice Lesch pt may discontinue Sinemet.  Dr. Delice Lesch will need to sign form and fax upon her return to the office.

## 2018-11-23 NOTE — Patient Instructions (Addendum)
Health Maintenance Due  Topic Date Due  . INFLUENZA VACCINE-today  09/26/2018   Great to see you today.   Only change is scheduling trazodone 25mg  at bedtime  Please stop by lab before you go If you do not have mychart- we will call you about results within 5 business days of [place] receiving them.  If you have mychart- we will send your results within 3 business days of [place] receiving them.  If abnormal or we want to clarify a result, we will call or mychart you to make sure you receive the message.  If you have questions or concerns or don't hear within 5-7 days, please send [place] a message or call us.

## 2018-11-23 NOTE — Progress Notes (Signed)
Phone [phone]   Subjective:  Larry Mejia is a 78 y.o. year old very pleasant male patient who presents for/with See problem oriented charting Chief Complaint  Patient presents with  . Follow-up  . Insomnia  . Hypertension  . Gastroesophageal Reflux   ROS-continue memory issues.  No reported chest pain or shortness of breath.  Stable trace edema  Past Medical History-  Patient Active Problem List   Diagnosis Date Noted  . Vitamin B 12 deficiency 03/29/2016    Priority: High  . Moderate dementia with behavioral disturbance ([place]) 03/06/2016    Priority: High  . Paroxysmal atrial fibrillation (HCC)     Priority: High  . Chronic pruritic rash in adult 03/24/2018    Priority: Medium  . History of supraventricular tachycardia     Priority: Medium  . HTN (hypertension)     Priority: Medium  . Essential tremor     Priority: Medium  . History of melanoma 05/31/2008    Priority: Medium  . GERD 07/30/2007    Priority: Medium  . Barrett's esophagus with dysplasia 07/30/2007    Priority: Medium  . Senile purpura ([place]) 03/06/2017    Priority: Low  . Allergic rhinitis 02/09/2014    Priority: Low  . Insomnia 02/09/2014    Priority: Low  . Mitral regurgitation     Priority: Low  . Aortic insufficiency     Priority: Low  . RBBB (right bundle branch block)     Priority: Low  . NEPHROLITHIASIS, HX OF 05/31/2008    Priority: Low  . BPH (benign prostatic hyperplasia) 05/31/2008    Priority: Low  . Voiding difficulty 03/06/2017    Medications- reviewed and updated Current Outpatient Medications  Medication Sig Dispense Refill  . acetaminophen (TYLENOL) 325 MG tablet Take 650 mg by mouth every 6 (six) hours as needed.      Marland Kitchen amLODipine (NORVASC) 2.5 MG tablet Take 1 tablet (2.5 mg total) by mouth daily. (Patient taking differently: Take 2.5 mg by mouth daily. Half tab daily) 90 tablet 3  . Cyanocobalamin (B-12 PO) Take by mouth.    . donepezil (ARICEPT) 10 MG tablet  Take 1 tablet every morning 90 tablet 3  . escitalopram (LEXAPRO) 20 MG tablet Take 1 tablet (20 mg total) by mouth daily. 90 tablet 3  . esomeprazole (NEXIUM) 40 MG capsule Take 40 mg by mouth 2 (two) times daily.     . traZODone (DESYREL) 50 MG tablet Take 0.5 tablets (25 mg total) by mouth at bedtime. 45 tablet 3   No current facility-administered medications for this visit.      Objective:  BP 110/70   Pulse (!) 58   Temp 98.6 F (37 C)   Ht 5\' 8"  (1.727 m)   Wt 153 lb 6.4 oz (69.6 kg)   SpO2 98%   BMI 23.32 kg/m  Gen: NAD, resting comfortably CV: RRR no murmurs rubs or gallops Lungs: CTAB no crackles, wheeze, rhonchi Abdomen: soft/nontender/nondistended/normal bowel sounds. No rebound or guarding.  Ext: Trace edema Skin: warm, dry, darker lesion greater than 6 mm on left upper back as well as darker raised lesion greater than 6 mm on right upper chest Neuro: Looks to son for answers at times    Assessment and Plan   #social update- now in [facility] as of September 2020- memory center. Some frustrations there- is wanting son to be able to come into help but limited due to covid 86.   # Isomnia/mourning/ dementia with  behavioral disturbance S: For the most part- mood related issues with his dementia are controlled with Lexapro 20 mg- elevated numbers on PHQ 9 the last 2 visits related to morning loss of wife in July 2020-also has had a big transition in his life moving to [facility] as above.  Trazodone 25 mg nightly was helpful when he was living with his son-now listed only as needed and he has not been asking for it but feels like sleep has been poor Depression screen [employer] 2/9 11/30/2018 09/03/2018 04/10/2018  Decreased Interest 0 2 0  Down, Depressed, Hopeless 2 3 0  PHQ - 2 Score 2 5 0  Altered sleeping 2 3 -  Tired, decreased energy 0 1 -  Change in appetite 1 0 -  Feeling bad or failure about yourself  2 - -  Trouble concentrating 1 0 -  Moving slowly or  fidgety/restless 0 0 -  Suicidal thoughts 0 0 -  PHQ-9 Score 8 9 -  Difficult doing work/chores Somewhat difficult - -  A/P: Elevated PHQ 9 related to mourning/recent life transition to [place].  Neither patient nor I nor son felt strongly about increasing/changing medication in regards to Lexapro.  We did think it may be helpful for him to sleep better each night and changed his trazodone to 25 mg nightly scheduled instead of as needed  #hypertension S:  controlled on amlodipine 2.5mg  (confirmed on meds from facility- not on half tab only)  A/P:  Stable. Continue current medications.    GERD/history of Barrett's esophagus S:controlled on Nexium 40mg  BID A/P: Reflux reasonably controlled- will need to check in at follow-up to see if he had follow-up with GI  #leukopenia- mild noted on last labs along with thrombocytopenia- will repeat CBC diff today.   #Essential tremor S:Was nervous about sinemet- due to concern of nausea. Dealing with tremor for now.  A/P: Stable-patient would like to continue without medication for now  #minimal issues recently with hernia and left groin-stable-continue to monitor  #B12 deficiency-consistent with oral B12 through his facility  #Patient to follow-up with Dr. Satira Sark his dermatologist- interested in his opinion on darker lesion on right upper chest and left upper back.  #Senile purpura/thrombocytopenia S: Continued issues with easy bruising/bleeding A/P: Discussed easy bruising/bleeding likely benign but with thrombocytopenia on last labs would repeat CBC with differential today-patient in agreement    Recommended follow up: 23-month follow-up or sooner if needed Future Appointments  Date Time Provider [place]  05/11/2019  3:00 PM Cameron Sprang, MD [facility] None    Lab/Order associations:   ICD-10-CM   1. Essential hypertension  I10 CBC with Differential/Platelet    Comprehensive metabolic panel  2. Primary insomnia  F51.01    3. Gastroesophageal reflux disease, unspecified whether esophagitis present  K21.9   4. Leukopenia, unspecified type  D72.819   5. Thrombocytopenia ([place])  D69.6   6. Senile purpura (HCC)  D69.2   7. Vitamin B 12 deficiency  E53.8   8. Essential tremor  G25.0     Meds ordered this encounter  Medications  . traZODone (DESYREL) 50 MG tablet    Sig: Take 0.5 tablets (25 mg total) by mouth at bedtime.    Dispense:  45 tablet    Refill:  3   Return precautions advised.  Garret Reddish, MD

## 2018-11-30 ENCOUNTER — Encounter: Payer: Self-pay | Admitting: Family Medicine

## 2018-11-30 ENCOUNTER — Ambulatory Visit (INDEPENDENT_AMBULATORY_CARE_PROVIDER_SITE_OTHER): Payer: Medicare Other | Admitting: Family Medicine

## 2018-11-30 ENCOUNTER — Other Ambulatory Visit: Payer: Self-pay

## 2018-11-30 VITALS — BP 110/70 | HR 58 | Temp 98.6°F | Ht 68.0 in | Wt 153.4 lb

## 2018-11-30 DIAGNOSIS — E538 Deficiency of other specified B group vitamins: Secondary | ICD-10-CM | POA: Diagnosis not present

## 2018-11-30 DIAGNOSIS — K219 Gastro-esophageal reflux disease without esophagitis: Secondary | ICD-10-CM | POA: Diagnosis not present

## 2018-11-30 DIAGNOSIS — D696 Thrombocytopenia, unspecified: Secondary | ICD-10-CM | POA: Diagnosis not present

## 2018-11-30 DIAGNOSIS — G250 Essential tremor: Secondary | ICD-10-CM

## 2018-11-30 DIAGNOSIS — D692 Other nonthrombocytopenic purpura: Secondary | ICD-10-CM | POA: Diagnosis not present

## 2018-11-30 DIAGNOSIS — D72819 Decreased white blood cell count, unspecified: Secondary | ICD-10-CM | POA: Diagnosis not present

## 2018-11-30 DIAGNOSIS — Z23 Encounter for immunization: Secondary | ICD-10-CM

## 2018-11-30 DIAGNOSIS — F5101 Primary insomnia: Secondary | ICD-10-CM | POA: Diagnosis not present

## 2018-11-30 DIAGNOSIS — I10 Essential (primary) hypertension: Secondary | ICD-10-CM | POA: Diagnosis not present

## 2018-11-30 LAB — COMPREHENSIVE METABOLIC PANEL
ALT: 9 U/L (ref 0–53)
AST: 15 U/L (ref 0–37)
Albumin: 4 g/dL (ref 3.5–5.2)
Alkaline Phosphatase: 53 U/L (ref 39–117)
BUN: 19 mg/dL (ref 6–23)
CO2: 28 mEq/L (ref 19–32)
Calcium: 9.3 mg/dL (ref 8.4–10.5)
Chloride: 105 mEq/L (ref 96–112)
Creatinine, Ser: 1.05 mg/dL (ref 0.40–1.50)
GFR: 68.21 mL/min (ref >60.00)
Glucose, Bld: 91 mg/dL (ref 70–99)
Potassium: 4.5 mEq/L (ref 3.5–5.1)
Sodium: 139 mEq/L (ref 135–145)
Total Bilirubin: 0.5 mg/dL (ref 0.2–1.2)
Total Protein: 6 g/dL (ref 6.0–8.3)

## 2018-11-30 LAB — CBC WITH DIFFERENTIAL/PLATELET
Basophils Absolute: 0.1 10*3/uL (ref 0.0–0.1)
Basophils Relative: 1.3 % (ref 0.0–3.0)
Eosinophils Absolute: 0 10*3/uL (ref 0.0–0.7)
Eosinophils Relative: 0.9 % (ref 0.0–5.0)
HCT: 40.2 % (ref 39.0–52.0)
Hemoglobin: 13.3 g/dL (ref 13.0–17.0)
Lymphocytes Relative: 22.3 % (ref 12.0–46.0)
Lymphs Abs: 1.1 10*3/uL (ref 0.7–4.0)
MCHC: 33.2 g/dL (ref 30.0–36.0)
MCV: 94.6 fl (ref 78.0–100.0)
Monocytes Absolute: 0.5 10*3/uL (ref 0.1–1.0)
Monocytes Relative: 9.8 % (ref 3.0–12.0)
Neutro Abs: 3.1 10*3/uL (ref 1.4–7.7)
Neutrophils Relative %: 65.7 % (ref 43.0–77.0)
Platelets: 137 10*3/uL — ABNORMAL LOW (ref 150.0–400.0)
RBC: 4.25 Mil/uL (ref 4.22–5.81)
RDW: 13.7 % (ref 11.5–15.5)
WBC: 4.7 10*3/uL (ref 4.0–10.5)

## 2018-11-30 MED ORDER — TRAZODONE HCL 50 MG PO TABS: 25.0000 mg | ORAL_TABLET | Freq: Every day | ORAL | 3 refills | Status: DC

## 2018-12-14 DIAGNOSIS — C44519 Basal cell carcinoma of skin of other part of trunk: Secondary | ICD-10-CM | POA: Diagnosis not present

## 2018-12-14 DIAGNOSIS — D2262 Melanocytic nevi of left upper limb, including shoulder: Secondary | ICD-10-CM | POA: Diagnosis not present

## 2018-12-14 DIAGNOSIS — D485 Neoplasm of uncertain behavior of skin: Secondary | ICD-10-CM | POA: Diagnosis not present

## 2018-12-14 DIAGNOSIS — L570 Actinic keratosis: Secondary | ICD-10-CM | POA: Diagnosis not present

## 2018-12-14 DIAGNOSIS — Z85828 Personal history of other malignant neoplasm of skin: Secondary | ICD-10-CM | POA: Diagnosis not present

## 2018-12-14 DIAGNOSIS — L821 Other seborrheic keratosis: Secondary | ICD-10-CM | POA: Diagnosis not present

## 2018-12-14 DIAGNOSIS — D225 Melanocytic nevi of trunk: Secondary | ICD-10-CM | POA: Diagnosis not present

## 2018-12-23 ENCOUNTER — Telehealth: Payer: Self-pay | Admitting: Family Medicine

## 2018-12-23 NOTE — Telephone Encounter (Signed)
Called gave verbal on v/m

## 2018-12-23 NOTE — Telephone Encounter (Signed)
See note

## 2018-12-23 NOTE — Telephone Encounter (Signed)
Copied from [place] [phone]. Topic: Quick Communication - Home Health Verbal Orders >> Dec 23, 2018  8:15 AM Jodie Echevaria wrote: Caller/Agency: [facility] Callback Number: [phone]  Requesting OT/PT/Skilled Nursing/Social Work/Speech Therapy: PT and OT  Frequency: based on evaluation, muscle weakness, strength and balance

## 2018-12-24 DIAGNOSIS — R488 Other symbolic dysfunctions: Secondary | ICD-10-CM | POA: Diagnosis not present

## 2018-12-25 DIAGNOSIS — R488 Other symbolic dysfunctions: Secondary | ICD-10-CM | POA: Diagnosis not present

## 2018-12-28 DIAGNOSIS — C44519 Basal cell carcinoma of skin of other part of trunk: Secondary | ICD-10-CM | POA: Diagnosis not present

## 2018-12-29 DIAGNOSIS — C44519 Basal cell carcinoma of skin of other part of trunk: Secondary | ICD-10-CM | POA: Diagnosis not present

## 2018-12-29 DIAGNOSIS — R488 Other symbolic dysfunctions: Secondary | ICD-10-CM | POA: Diagnosis not present

## 2018-12-30 DIAGNOSIS — R488 Other symbolic dysfunctions: Secondary | ICD-10-CM | POA: Diagnosis not present

## 2019-01-05 DIAGNOSIS — Z03818 Encounter for observation for suspected exposure to other biological agents ruled out: Secondary | ICD-10-CM | POA: Diagnosis not present

## 2019-01-25 ENCOUNTER — Telehealth: Payer: Self-pay

## 2019-01-25 NOTE — Telephone Encounter (Signed)
I have placed fax in your box from [facility]. Patient tested positive for Covid today.  We need to fill out for any orders and fax back ASAP

## 2019-01-25 NOTE — Telephone Encounter (Signed)
Do we need to have him do virtual for hernia pain? If not ok to give order? If so how much and now often.

## 2019-01-25 NOTE — Telephone Encounter (Signed)
The form did not request information about Tylenol-I did add this as an order.  It asked about vitamin C and zinc  I am happy to see them for virtual visit if they would like to discuss COVID-19

## 2019-01-25 NOTE — Telephone Encounter (Signed)
Copied from [place] #[id number]. Topic: General - Other >> Jan 25, 2019  9:36 AM Rainey Pines A wrote: Patients son called and wants to know if authorization can be given to [facility] to give patient tylenol when hernia is bothering him and normal aches and pains.  [facility] also notified patients son that hernia is bothering him as well. Please contact son at [phone]

## 2019-01-26 NOTE — Telephone Encounter (Signed)
Form faxed

## 2019-01-27 NOTE — Telephone Encounter (Signed)
Patient's son Aaron Edelman came into the office very concerned about his father and his dementia. He stated that he has been very agitated and wanted to set up an appointment with Dr. Yong Channel however, was unable to be physically with the patient due to the patient being in an assisted living facility. I was able to speak with Dr. Yong Channel who suggested a type of 3 way call with the patient and Aaron Edelman.   I was able to get this scheduled for tomorrow 12/3 for Dr. Ansel Bong team to send a doxy link to the email of the patient's caregiver at the assisted living [facility] at [email or web address] and she will be in the room with the patient and for Dr. Yong Channel to call the patient's son Aaron Edelman on his cell number at [phone].  Aaron Edelman gave me a form that I have placed in Dr. Ronney Lion folder in the POD to review, it is a form that Dr. Delice Lesch prescribed once before for agitation.   Patient's son Aaron Edelman was very Patent attorney.   *The facility also stated they were faxing a separate form over about the pharmacy needign the medication for the vitamin C to be a 500mg  or 1000mg  prescription.

## 2019-01-28 ENCOUNTER — Ambulatory Visit (INDEPENDENT_AMBULATORY_CARE_PROVIDER_SITE_OTHER): Payer: Medicare Other | Admitting: Family Medicine

## 2019-01-28 ENCOUNTER — Encounter: Payer: Self-pay | Admitting: Family Medicine

## 2019-01-28 VITALS — BP 134/87 | HR 70 | Temp 99.0°F | Resp 20

## 2019-01-28 DIAGNOSIS — F03B18 Unspecified dementia, moderate, with other behavioral disturbance: Secondary | ICD-10-CM

## 2019-01-28 DIAGNOSIS — U071 COVID-19: Secondary | ICD-10-CM | POA: Diagnosis not present

## 2019-01-28 DIAGNOSIS — F0391 Unspecified dementia with behavioral disturbance: Secondary | ICD-10-CM | POA: Diagnosis not present

## 2019-01-28 MED ORDER — QUETIAPINE FUMARATE 25 MG PO TABS: 25.0000 mg | ORAL_TABLET | Freq: Every day | ORAL | 5 refills | Status: DC

## 2019-01-28 NOTE — Progress Notes (Addendum)
Phone [phone] Virtual visit via Video note   Subjective:  Chief complaint: Chief Complaint  Patient presents with  . virtual  . medication f/u    This visit type was conducted due to national recommendations for restrictions regarding the COVID-19 Pandemic (e.g. social distancing).  This format is felt to be most appropriate for this patient at this time balancing risks to patient and risks to population by having him in for in person visit.  No physical exam was performed (except for noted visual exam or audio findings with Telehealth visits).    Our team/I connected with Larry Mejia at 10:00 AM EST by a video enabled telemedicine application (doxy.me or caregility through epic) and verified that I am speaking with the correct person using two identifiers.  Location patient: [facility] Location provider: [facility], office Persons participating in the virtual visit:  Patient, caregiver at [facility], son by phone  Our team/I discussed the limitations of evaluation and management by telemedicine and the availability of in person appointments. In light of current covid-19 pandemic, patient also understands that we are trying to protect them by minimizing in office contact if at all possible.  The patient expressed consent for telemedicine visit and agreed to proceed. Patient understands insurance will be billed.   ROS-some dry cough.  No fever chills.  No nausea or vomiting reported.  Reports confusion intermittently - not worsening recently  Past Medical History-  Patient Active Problem List   Diagnosis Date Noted  . Vitamin B 12 deficiency 03/29/2016    Priority: High  . Moderate dementia with behavioral disturbance ([place]) 03/06/2016    Priority: High  . Paroxysmal atrial fibrillation (HCC)     Priority: High  . Chronic pruritic rash in adult 03/24/2018    Priority: Medium  . History of supraventricular tachycardia     Priority: Medium  . HTN (hypertension)    Priority: Medium  . Essential tremor     Priority: Medium  . History of melanoma 05/31/2008    Priority: Medium  . GERD 07/30/2007    Priority: Medium  . Barrett's esophagus with dysplasia 07/30/2007    Priority: Medium  . Senile purpura ([place]) 03/06/2017    Priority: Low  . Allergic rhinitis 02/09/2014    Priority: Low  . Insomnia 02/09/2014    Priority: Low  . Mitral regurgitation     Priority: Low  . Aortic insufficiency     Priority: Low  . RBBB (right bundle branch block)     Priority: Low  . NEPHROLITHIASIS, HX OF 05/31/2008    Priority: Low  . BPH (benign prostatic hyperplasia) 05/31/2008    Priority: Low  . Voiding difficulty 03/06/2017    Medications- reviewed and updated Current Outpatient Medications  Medication Sig Dispense Refill  . acetaminophen (TYLENOL) 325 MG tablet Take 650 mg by mouth every 6 (six) hours as needed.      Marland Kitchen amLODipine (NORVASC) 2.5 MG tablet Take 1 tablet (2.5 mg total) by mouth daily. (Patient taking differently: Take 2.5 mg by mouth daily. Half tab daily) 90 tablet 3  . Cyanocobalamin (B-12 PO) Take by mouth.    . donepezil (ARICEPT) 10 MG tablet Take 1 tablet every morning 90 tablet 3  . escitalopram (LEXAPRO) 20 MG tablet Take 1 tablet (20 mg total) by mouth daily. 90 tablet 3  . esomeprazole (NEXIUM) 40 MG capsule Take 40 mg by mouth 2 (two) times daily.     . traZODone (DESYREL) 50 MG tablet Take  0.5 tablets (25 mg total) by mouth at bedtime. 45 tablet 3  . QUEtiapine (SEROQUEL) 25 MG tablet Take 1 tablet (25 mg total) by mouth at bedtime. 30 tablet 5   No current facility-administered medications for this visit.      Objective:  BP 134/87   Pulse 70   Temp 99 F (37.2 C)   Resp 20   SpO2 97%  self reported vitals Gen: NAD, resting comfortably Lungs: nonlabored, normal respiratory rate  Skin: appears dry, no obvious rash     Assessment and Plan    Dementia with behavioral disturbance/Aggitation S: Pt son states on  Thanksgiving pt was depressed and stated he felt like hurting himself (Stated if had handgun to son he would want to hurt himself but does not have a current plan and he is under 24 hour supervision)  and he gets confused with the simple things such as using a telephone and has a hard time recognizing things-son reports this has been an ongoing issue for at least a month  Everyday he thinks he is traveling and wonders how he will get back.  Just aware enough to know he is not getting things right.   Every time his son talks to him-his son hears desperation in patients voice- wants someone to help him.   Son denies worsening symptoms since COVID-19 diagnosis.   A/P: Dementia with behavioral disturbance not controlled by Lexapro 20 mg.  Patient's quality of life seems to be suffering from the confusion producing agitation.  I had considered Seroquel versus Zyprexa-  #Please note patient does not carry a diagnosis of Parkinson's.  He had a right hand tremor without bradykinesia and was to trial Sinemet through neurology. Possible nausea was a big concern for patient so never took the sinemet.   Regardless to be on the safe side I will go ahead and try Seroquel which is considered safer in Parkinson's.  Low-dose in the evening will be tried-staff at facility and son will update me with how he is doing.  Ideally would see each other again within 3 to 6 months though we did not specifically discuss follow-up.  Did discuss some metabolic risks but I think benefit outweighs risk-also specifically discussed with some potential increased mortality risk on antipsychotics-once again son wants to help his dad have best quality of life so potential benefit outweighs risks  We did not collect a PHQ-9 today due to dementia and patient's difficulty with concept of being in visit with me but also some being on the phone during this visit-was confusing to him and we opted to hold off  #COVID-19 S:Friday. Some dry  cough. No fever. No dysuria. No polyuria. No chest pain. Several residents and staff positive at [facility]  Pt has been tested positive and he does not know that he has tested positive ( pt not aware and sons DO NOT want pt to know)  A/P: Son is very concerned that patient may be more agitated if he finds out he is positive for COVID-19.  We did not disclose this information with patient but discussed in light of current COVID-19 pandemic that I wanted him to be checked for oxygen levels or shortness of breath at least 3 times a day-[facility] will send an order sheet but also include vitals from today-a state collected earlier and were normal supposedly but I would like to have a copy to update this note.  I discussed with son there is no current outpatient  treatment other than potential antibiotic treatment which we do not have access to except for select patients per COVID-19 algorithm  Recommended follow up: Ideally would see each other again within 3 to 6 months though we did not specifically discuss follow-up. Future Appointments  Date Time Provider [place]  05/11/2019  3:00 PM Cameron Sprang, MD [facility] None   Lab/Order associations:   ICD-10-CM   1. Moderate dementia with behavioral disturbance ([place])  F03.91   2. COVID-19  U07.1    Meds ordered this encounter  Medications  . QUEtiapine (SEROQUEL) 25 MG tablet    Sig: Take 1 tablet (25 mg total) by mouth at bedtime.    Dispense:  30 tablet    Refill:  5   Return precautions advised.  Garret Reddish, MD

## 2019-01-28 NOTE — Patient Instructions (Addendum)
There are no preventive care reminders to display for this patient. Depression screen [facility] 2/9 11/30/2018 09/03/2018 04/10/2018  Decreased Interest 0 2 0  Down, Depressed, Hopeless 2 3 0  PHQ - 2 Score 2 5 0  Altered sleeping 2 3 -  Tired, decreased energy 0 1 -  Change in appetite 1 0 -  Feeling bad or failure about yourself  2 - -  Trouble concentrating 1 0 -  Moving slowly or fidgety/restless 0 0 -  Suicidal thoughts 0 0 -  PHQ-9 Score 8 9 -  Difficult doing work/chores Somewhat difficult - -

## 2019-01-29 ENCOUNTER — Telehealth: Payer: Self-pay | Admitting: Family Medicine

## 2019-01-29 NOTE — Telephone Encounter (Signed)
[place] Farm Director for [employer] stated pt is supposed to have order for antipsychotic medication sent to them. Also pt's rx for Vitamin C needs to be changed to 500MG  and Zinc needs to be changed to 50MG   Orders need to be faxed to [phone]  The pharmacy is [facility]

## 2019-01-29 NOTE — Telephone Encounter (Signed)
Please advise ok to send orders

## 2019-01-30 NOTE — Telephone Encounter (Signed)
You can print the Seroquel order and send to the facility-I sent it to his pharmacy.  In regards to zinc: Recommendations There are insufficient data to recommend either for or against the use of zinc for the treatment of COVID-19. The COVID-19 Treatment Guidelines Panel (the Panel) recommends against using zinc supplementation above the recommended dietary allowance for the prevention of COVID-19, except in a clinical trial (BIII).  I am okay with him using 50 mg of zinc for 14 days maximum.  I am okay with vitamin C 500 mg for 14 days.  Both are for daily use and stop after that.  Can print this and stamp it with my signature and sent to facility along with Seroquel order stat.

## 2019-02-01 ENCOUNTER — Other Ambulatory Visit: Payer: Self-pay

## 2019-02-01 MED ORDER — QUETIAPINE FUMARATE 25 MG PO TABS: 25.0000 mg | ORAL_TABLET | Freq: Every day | ORAL | 5 refills | Status: DC

## 2019-02-01 NOTE — Telephone Encounter (Signed)
Charity, with [employer], calling. She states that facility never received fax. She is requesting a call back to discuss.    Cb# [phone]

## 2019-02-01 NOTE — Telephone Encounter (Signed)
Rx and orders faxed to [facility].

## 2019-02-01 NOTE — Telephone Encounter (Signed)
Called spoke to charity she has received. Reviewed all orders with her. No further questions.

## 2019-02-08 ENCOUNTER — Telehealth: Payer: Self-pay

## 2019-02-08 NOTE — Telephone Encounter (Signed)
Called l/m on both numbers for wife to make app.

## 2019-02-08 NOTE — Telephone Encounter (Signed)
We have received a few faxes over the weekend with noted changes in behavior and some swelling groin and abdomen swelling. I have placed in your box not sure if you want me to call to get in today to be seen.

## 2019-02-08 NOTE — Telephone Encounter (Signed)
Yes thanks- visit would be ideal- not much I can do without discussing with patient/family/caregivers

## 2019-02-08 NOTE — Telephone Encounter (Signed)
Called appointment made in office patient has had negative covid test last Thursday

## 2019-02-09 ENCOUNTER — Encounter: Payer: Self-pay | Admitting: Family Medicine

## 2019-02-09 ENCOUNTER — Other Ambulatory Visit: Payer: Self-pay

## 2019-02-09 ENCOUNTER — Ambulatory Visit (INDEPENDENT_AMBULATORY_CARE_PROVIDER_SITE_OTHER): Payer: Medicare Other | Admitting: Family Medicine

## 2019-02-09 VITALS — BP 122/70 | HR 58 | Temp 98.3°F | Ht 68.0 in | Wt 140.6 lb

## 2019-02-09 DIAGNOSIS — R319 Hematuria, unspecified: Secondary | ICD-10-CM | POA: Diagnosis not present

## 2019-02-09 DIAGNOSIS — N3001 Acute cystitis with hematuria: Secondary | ICD-10-CM

## 2019-02-09 DIAGNOSIS — F0391 Unspecified dementia with behavioral disturbance: Secondary | ICD-10-CM

## 2019-02-09 DIAGNOSIS — R1084 Generalized abdominal pain: Secondary | ICD-10-CM | POA: Diagnosis not present

## 2019-02-09 DIAGNOSIS — F03B18 Unspecified dementia, moderate, with other behavioral disturbance: Secondary | ICD-10-CM

## 2019-02-09 LAB — POC URINALSYSI DIPSTICK (AUTOMATED)
Bilirubin, UA: NEGATIVE
Blood, UA: POSITIVE
Glucose, UA: NEGATIVE
Ketones, UA: POSITIVE
Nitrite, UA: POSITIVE
Protein, UA: POSITIVE — AB
Spec Grav, UA: >=1.03 — AB (ref 1.010–1.025)
Urobilinogen, UA: 0.2 E.U./dL
pH, UA: 5.5 (ref 5.0–8.0)

## 2019-02-09 MED ORDER — CEPHALEXIN 500 MG PO CAPS: 500.0000 mg | ORAL_CAPSULE | Freq: Four times a day (QID) | ORAL | 0 refills | Status: DC

## 2019-02-09 NOTE — Patient Instructions (Addendum)
I will send fax to [facility] about urinarty tract infection treatment. We are getting a culture for more information- if no true UTI may stop antibiotic.   Possible kidney stone with blood in urine- we may have to get CT scan for more info. Would love for [facility] to track complaints of left flank pain- if becoming more frequent may push [place] towards the scan.   May need additional support for agitation- I am going to reach out to Dr. Delice Lesch- she may want an updated visit with you.

## 2019-02-09 NOTE — Progress Notes (Signed)
Phone [phone] In person visit   Subjective:   Calden Toups is a 78 y.o. year old very pleasant male patient who presents for/with See problem oriented charting Chief Complaint  Patient presents with  . Follow-up  . mood changes  . abd issues    ROS-no fever/chills/nausea/vomiting.  Prior cough has resolved.  Complains of left lower quadrant pain and left flank pain at times  This visit occurred during the SARS-CoV-2 public health emergency.  Safety protocols were in place, including screening questions prior to the visit, additional usage of staff PPE, and extensive cleaning of exam room while observing appropriate contact time as indicated for disinfecting solutions.   Past Medical History-  Patient Active Problem List   Diagnosis Date Noted  . Vitamin B 12 deficiency 03/29/2016    Priority: High  . Moderate dementia with behavioral disturbance ([place]) 03/06/2016    Priority: High  . Paroxysmal atrial fibrillation (HCC)     Priority: High  . Chronic pruritic rash in adult 03/24/2018    Priority: Medium  . History of supraventricular tachycardia     Priority: Medium  . HTN (hypertension)     Priority: Medium  . Essential tremor     Priority: Medium  . History of melanoma 05/31/2008    Priority: Medium  . GERD 07/30/2007    Priority: Medium  . Barrett's esophagus with dysplasia 07/30/2007    Priority: Medium  . Senile purpura ([place]) 03/06/2017    Priority: Low  . Allergic rhinitis 02/09/2014    Priority: Low  . Insomnia 02/09/2014    Priority: Low  . Mitral regurgitation     Priority: Low  . Aortic insufficiency     Priority: Low  . RBBB (right bundle branch block)     Priority: Low  . NEPHROLITHIASIS, HX OF 05/31/2008    Priority: Low  . BPH (benign prostatic hyperplasia) 05/31/2008    Priority: Low  . Voiding difficulty 03/06/2017    Medications- reviewed and updated Current Outpatient Medications  Medication Sig Dispense Refill  .  acetaminophen (TYLENOL) 325 MG tablet Take 650 mg by mouth every 6 (six) hours as needed.      Marland Kitchen amLODipine (NORVASC) 2.5 MG tablet Take 1 tablet (2.5 mg total) by mouth daily. (Patient taking differently: Take 2.5 mg by mouth daily. Half tab daily) 90 tablet 3  . Cholecalciferol (VITAMIN D3) 25 MCG (1000 UT) CHEW Chew by mouth.    . Cyanocobalamin (B-12 PO) Take by mouth.    . donepezil (ARICEPT) 10 MG tablet Take 1 tablet every morning 90 tablet 3  . escitalopram (LEXAPRO) 20 MG tablet Take 1 tablet (20 mg total) by mouth daily. 90 tablet 3  . esomeprazole (NEXIUM) 40 MG capsule Take 40 mg by mouth 2 (two) times daily.     . QUEtiapine (SEROQUEL) 25 MG tablet Take 1 tablet (25 mg total) by mouth at bedtime. 30 tablet 5  . traZODone (DESYREL) 50 MG tablet Take 0.5 tablets (25 mg total) by mouth at bedtime. 45 tablet 3   No current facility-administered medications for this visit.     Objective:  BP 122/70   Pulse (!) 58   Temp 98.3 F (36.8 C)   Ht 5\' 8"  (1.727 m)   Wt 140 lb 9.6 oz (63.8 kg)   SpO2 97%   BMI 21.38 kg/m  Gen: NAD, resting comfortably CV: RRR no murmurs rubs or gallops Lungs: CTAB no crackles, wheeze, rhonchi Abdomen: soft/nontender/nondistended/normal bowel sounds.  Ext:  no edema Skin: warm, dry GU: Left inguinal hernia noted-no pain with palpation today.  Very mild bradycardia left CVA tenderness but then resolved    Assessment and Plan   #Increased confusion and Abdominal issue S: I have received multiple complaints from [facility] about worsening confusion.  Patient reportedly has increased episodes of confusion dating back to at least November.  He has had periods of agitation including episodes of calling out for help repeatedly-he does the same when his sons call.  Challenging time as sons are not able to see him regularly.  Patient has a lot of concerns about being lost or being on the side of the road or having a hard time getting home.   Of note this has been a particularly challenging year for patient with worsening memory issues as well as loss of his wife.  He has had trouble sleeping.  At last visit we started patient on a low-dose of Seroquel in the evening time.  Patient has had about 8 days of this and has not had worsening agitation but simply has not improved as much as caregivers would like.  Patient does have a known inguinal hernia on the left.  He has complained of left lower quadrant pain but also intermittent left flank pain which is sharp and severe but only lasts seconds.  Left lower quadrant pain has been ongoing.  No dysuria noted.  Reports slight polyuria.  No penile discharge.  Notes from facility mention right-sided pain but patient denies that.  Also he denies any hip pain or radiating pain down the leg.  Of note patient has had nephrolithiasis issues in the past as well as UTI back in June.  Of note patient also had COVID-19 but is outside of his quarantine.-No reported cough/congestion today. A/P: With left lower quadrant pain as well as worsening confusion and UA results as above-concern for UTI.  Will treat with Keflex 500 mg 4 times a day for 10 days. -I do not strongly suspect hernia is causing his current pain and I do not think he would be a great surgical candidate-we opted to defer surgical consultation  Did have an episode of left flank pain briefly during exam today-updating labs as below.  This could be related to a kidney stone also with hematuria-I asked for some observation on how frequently he is experiencing pain from his facility-could consider CT if ongoing/recurrent issue  I asked facility in a fax to let me know about 7 days after treatment outpatient is doing.  We could consider other therapies for agitation but I would like to get the opinion of his neurologist Dr. Delice Lesch and see if she would recommend a follow-up visit with her. I have routed her some questions to see if she would like to  evaluate him.   Depression certainly contributes to patient's symptoms.  He is already on Lexapro 20 mg.  I do not feel strongly about increasing trazodone until after we have treated possible UTI.  Family is considering transitioning patient's care to Dr. Parks Ranger can travel to the facility like doctors taking housecalls-I was honest that may be a more desirable solution given patient's ongoing complex needs that often would benefit from an evaluation  Recommended follow up: We did not schedule plan follow-up but with follow-up with symptoms fail to improve Future Appointments  Date Time Provider [place]  05/11/2019  3:00 PM Cameron Sprang, MD [facility] None    Lab/Order associations:   ICD-10-CM  1. Acute cystitis with hematuria  N30.01   2. Moderate dementia with behavioral disturbance ([place])  F03.91   3. Abdominal pain, generalized  R10.84 CBC w/Diff    Lipase    CMP    POCT Urinalysis Dipstick (Automated)    Urine culture  4. Hematuria, unspecified type  R31.9 Deddrick Saindon urine micro only    Meds ordered this encounter  Medications  . cephALEXin (KEFLEX) 500 MG capsule    Sig: Take 1 capsule (500 mg total) by mouth 4 (four) times daily.    Dispense:  40 capsule    Refill:  0    Time Stamp The duration of face-to-face time during this visit was greater than 40 minutes (4:33-5:13 PM). Greater than 50% of this time was spent in counseling, explanation of diagnosis, planning of further management, and/or coordination of care including counseling about potential transition to doctors making house calls, counseling about potential causes of abdominal pain, counseling about deferring hernia surgery given age and memory issues, discussing treatment for UTI, discussing monitoring of symptoms post UTI, discussing agitation issues/depression and potential treatment as well as need to coordinate care.    Return precautions advised.  Garret Reddish, MD

## 2019-02-10 LAB — CBC WITH DIFFERENTIAL/PLATELET
Basophils Absolute: 0 10*3/uL (ref 0.0–0.1)
Basophils Relative: 1 % (ref 0.0–3.0)
Eosinophils Absolute: 0 10*3/uL (ref 0.0–0.7)
Eosinophils Relative: 1 % (ref 0.0–5.0)
HCT: 39 % (ref 39.0–52.0)
Hemoglobin: 12.9 g/dL — ABNORMAL LOW (ref 13.0–17.0)
Lymphocytes Relative: 27.7 % (ref 12.0–46.0)
Lymphs Abs: 1.2 10*3/uL (ref 0.7–4.0)
MCHC: 33.1 g/dL (ref 30.0–36.0)
MCV: 93.9 fl (ref 78.0–100.0)
Monocytes Absolute: 0.4 10*3/uL (ref 0.1–1.0)
Monocytes Relative: 9.1 % (ref 3.0–12.0)
Neutro Abs: 2.7 10*3/uL (ref 1.4–7.7)
Neutrophils Relative %: 61.2 % (ref 43.0–77.0)
Platelets: 125 10*3/uL — ABNORMAL LOW (ref 150.0–400.0)
RBC: 4.15 Mil/uL — ABNORMAL LOW (ref 4.22–5.81)
RDW: 13.8 % (ref 11.5–15.5)
WBC: 4.5 10*3/uL (ref 4.0–10.5)

## 2019-02-10 LAB — COMPREHENSIVE METABOLIC PANEL
ALT: 10 U/L (ref 0–53)
AST: 17 U/L (ref 0–37)
Albumin: 3.9 g/dL (ref 3.5–5.2)
Alkaline Phosphatase: 52 U/L (ref 39–117)
BUN: 18 mg/dL (ref 6–23)
CO2: 28 mEq/L (ref 19–32)
Calcium: 9 mg/dL (ref 8.4–10.5)
Chloride: 107 mEq/L (ref 96–112)
Creatinine, Ser: 1.06 mg/dL (ref 0.40–1.50)
GFR: 67.43 mL/min (ref >60.00)
Glucose, Bld: 114 mg/dL — ABNORMAL HIGH (ref 70–99)
Potassium: 3.6 mEq/L (ref 3.5–5.1)
Sodium: 143 mEq/L (ref 135–145)
Total Bilirubin: 0.5 mg/dL (ref 0.2–1.2)
Total Protein: 5.9 g/dL — ABNORMAL LOW (ref 6.0–8.3)

## 2019-02-10 LAB — LIPASE: Lipase: 34 U/L (ref 11.0–59.0)

## 2019-02-10 LAB — URINALYSIS, MICROSCOPIC ONLY: RBC / HPF: NONE SEEN (ref 0–?)

## 2019-02-10 NOTE — Progress Notes (Signed)
No blood on follow-up urine test but there were some calcium oxalate crystals which can suggest kidney stones potentially-if left flank pain becomes more frequent we could update of CT scan  No signs of pancreas inflammation.  Kidney and liver are normal.  Very slight anemia noted.  This may be difficult to coordinate but I would like for patient to be set up with stool cards to make sure there is no blood in the stool.  Very slightly low platelets-continue to monitor.  I would like to repeat a CBC with differential at next visit if there is no blood in the stool  Urine culture still pending-continue antibiotics

## 2019-02-11 LAB — URINE CULTURE
MICRO NUMBER:: 1199449
SPECIMEN QUALITY:: ADEQUATE

## 2019-02-11 NOTE — Progress Notes (Signed)
Patient does have a urinary tract infection.  He is on appropriate antibiotic

## 2019-02-15 ENCOUNTER — Telehealth: Payer: Self-pay

## 2019-02-15 ENCOUNTER — Other Ambulatory Visit: Payer: Self-pay

## 2019-02-15 DIAGNOSIS — K921 Melena: Secondary | ICD-10-CM

## 2019-02-15 DIAGNOSIS — R319 Hematuria, unspecified: Secondary | ICD-10-CM

## 2019-02-15 NOTE — Telephone Encounter (Signed)
Called and lm on pt son vm making him aware that I have mailed the stool cards to the address below.

## 2019-02-15 NOTE — Progress Notes (Signed)
Left message to return call to our office.  

## 2019-02-15 NOTE — Telephone Encounter (Signed)
See note

## 2019-02-15 NOTE — Telephone Encounter (Signed)
Pt's son Armani Daigre given lab results per notes of Dr. Yong Channel on 02/10/19. Pt's son  verbalized understanding. Son is asking for the stool cards to be sent to:  [place] [address] Please notify Jajaun Lacki if unable to do this.

## 2019-02-16 ENCOUNTER — Telehealth: Payer: Self-pay | Admitting: Family Medicine

## 2019-02-16 NOTE — Telephone Encounter (Signed)
Home Health Verbal Orders - Caller/Agency: Marye Round, from [facility] Number: [phone] Requesting OT/PT/Skilled Nursing/Social Work/Speech Therapy: PT request, pt is high risk for falling.  Frequency: determined after initial assessment

## 2019-02-16 NOTE — Telephone Encounter (Signed)
See note

## 2019-02-17 NOTE — Telephone Encounter (Signed)
See note

## 2019-02-17 NOTE — Telephone Encounter (Signed)
[place] returned call and would like a callback from [place] at [phone]

## 2019-02-17 NOTE — Telephone Encounter (Signed)
Returned the call to [place] and lmtcb.

## 2019-02-18 NOTE — Telephone Encounter (Signed)
Returned [place] call and left another vm tcb.

## 2019-02-23 ENCOUNTER — Telehealth: Payer: Self-pay | Admitting: Family Medicine

## 2019-02-23 NOTE — Telephone Encounter (Signed)
Called and spoke with [facility] and she states since this is a memory care facility that need an Rx or an order faxed to be able to do the stool sample. I informed her you are out of office on Monday and we would take care of this Monday and fax it to them.

## 2019-02-23 NOTE — Telephone Encounter (Signed)
See note

## 2019-02-23 NOTE — Telephone Encounter (Signed)
Research officer, political party at [employer] is calling and needs a physician order to collect stool sample from the patient. Please fax to [phone]

## 2019-02-24 NOTE — Telephone Encounter (Signed)
I am ok with it being written as rx or an order and using my stamp. If they have issues with stamp tell them that is due to me being out of office- alternatively could order stool card and then print it and send to them with stamp

## 2019-02-26 DIAGNOSIS — M6281 Muscle weakness (generalized): Secondary | ICD-10-CM | POA: Diagnosis not present

## 2019-02-26 DIAGNOSIS — R2681 Unsteadiness on feet: Secondary | ICD-10-CM | POA: Diagnosis not present

## 2019-02-26 DIAGNOSIS — R488 Other symbolic dysfunctions: Secondary | ICD-10-CM | POA: Diagnosis not present

## 2019-03-01 DIAGNOSIS — R488 Other symbolic dysfunctions: Secondary | ICD-10-CM | POA: Diagnosis not present

## 2019-03-01 DIAGNOSIS — M6281 Muscle weakness (generalized): Secondary | ICD-10-CM | POA: Diagnosis not present

## 2019-03-01 DIAGNOSIS — R2681 Unsteadiness on feet: Secondary | ICD-10-CM | POA: Diagnosis not present

## 2019-03-01 DIAGNOSIS — Z03818 Encounter for observation for suspected exposure to other biological agents ruled out: Secondary | ICD-10-CM | POA: Diagnosis not present

## 2019-03-02 DIAGNOSIS — M6281 Muscle weakness (generalized): Secondary | ICD-10-CM | POA: Diagnosis not present

## 2019-03-02 DIAGNOSIS — R488 Other symbolic dysfunctions: Secondary | ICD-10-CM | POA: Diagnosis not present

## 2019-03-02 DIAGNOSIS — R2681 Unsteadiness on feet: Secondary | ICD-10-CM | POA: Diagnosis not present

## 2019-03-03 DIAGNOSIS — M6281 Muscle weakness (generalized): Secondary | ICD-10-CM | POA: Diagnosis not present

## 2019-03-03 DIAGNOSIS — R488 Other symbolic dysfunctions: Secondary | ICD-10-CM | POA: Diagnosis not present

## 2019-03-03 DIAGNOSIS — R2681 Unsteadiness on feet: Secondary | ICD-10-CM | POA: Diagnosis not present

## 2019-03-05 DIAGNOSIS — R488 Other symbolic dysfunctions: Secondary | ICD-10-CM | POA: Diagnosis not present

## 2019-03-05 DIAGNOSIS — R2681 Unsteadiness on feet: Secondary | ICD-10-CM | POA: Diagnosis not present

## 2019-03-05 DIAGNOSIS — M6281 Muscle weakness (generalized): Secondary | ICD-10-CM | POA: Diagnosis not present

## 2019-03-05 NOTE — Telephone Encounter (Signed)
Faxed to [facility].

## 2019-03-08 DIAGNOSIS — M6281 Muscle weakness (generalized): Secondary | ICD-10-CM | POA: Diagnosis not present

## 2019-03-08 DIAGNOSIS — Z03818 Encounter for observation for suspected exposure to other biological agents ruled out: Secondary | ICD-10-CM | POA: Diagnosis not present

## 2019-03-08 DIAGNOSIS — R488 Other symbolic dysfunctions: Secondary | ICD-10-CM | POA: Diagnosis not present

## 2019-03-08 DIAGNOSIS — R2681 Unsteadiness on feet: Secondary | ICD-10-CM | POA: Diagnosis not present

## 2019-03-09 DIAGNOSIS — R2681 Unsteadiness on feet: Secondary | ICD-10-CM | POA: Diagnosis not present

## 2019-03-09 DIAGNOSIS — M6281 Muscle weakness (generalized): Secondary | ICD-10-CM | POA: Diagnosis not present

## 2019-03-09 DIAGNOSIS — R488 Other symbolic dysfunctions: Secondary | ICD-10-CM | POA: Diagnosis not present

## 2019-03-10 DIAGNOSIS — R2681 Unsteadiness on feet: Secondary | ICD-10-CM | POA: Diagnosis not present

## 2019-03-10 DIAGNOSIS — M6281 Muscle weakness (generalized): Secondary | ICD-10-CM | POA: Diagnosis not present

## 2019-03-10 DIAGNOSIS — R488 Other symbolic dysfunctions: Secondary | ICD-10-CM | POA: Diagnosis not present

## 2019-03-11 DIAGNOSIS — R2681 Unsteadiness on feet: Secondary | ICD-10-CM | POA: Diagnosis not present

## 2019-03-11 DIAGNOSIS — M6281 Muscle weakness (generalized): Secondary | ICD-10-CM | POA: Diagnosis not present

## 2019-03-11 DIAGNOSIS — R488 Other symbolic dysfunctions: Secondary | ICD-10-CM | POA: Diagnosis not present

## 2019-03-12 DIAGNOSIS — M6281 Muscle weakness (generalized): Secondary | ICD-10-CM | POA: Diagnosis not present

## 2019-03-12 DIAGNOSIS — R2681 Unsteadiness on feet: Secondary | ICD-10-CM | POA: Diagnosis not present

## 2019-03-12 DIAGNOSIS — R488 Other symbolic dysfunctions: Secondary | ICD-10-CM | POA: Diagnosis not present

## 2019-03-12 DIAGNOSIS — Z23 Encounter for immunization: Secondary | ICD-10-CM | POA: Diagnosis not present

## 2019-03-15 DIAGNOSIS — M6281 Muscle weakness (generalized): Secondary | ICD-10-CM | POA: Diagnosis not present

## 2019-03-15 DIAGNOSIS — R2681 Unsteadiness on feet: Secondary | ICD-10-CM | POA: Diagnosis not present

## 2019-03-15 DIAGNOSIS — Z03818 Encounter for observation for suspected exposure to other biological agents ruled out: Secondary | ICD-10-CM | POA: Diagnosis not present

## 2019-03-15 DIAGNOSIS — R488 Other symbolic dysfunctions: Secondary | ICD-10-CM | POA: Diagnosis not present

## 2019-03-16 DIAGNOSIS — R488 Other symbolic dysfunctions: Secondary | ICD-10-CM | POA: Diagnosis not present

## 2019-03-16 DIAGNOSIS — M6281 Muscle weakness (generalized): Secondary | ICD-10-CM | POA: Diagnosis not present

## 2019-03-16 DIAGNOSIS — R2681 Unsteadiness on feet: Secondary | ICD-10-CM | POA: Diagnosis not present

## 2019-03-17 DIAGNOSIS — M6281 Muscle weakness (generalized): Secondary | ICD-10-CM | POA: Diagnosis not present

## 2019-03-17 DIAGNOSIS — R2681 Unsteadiness on feet: Secondary | ICD-10-CM | POA: Diagnosis not present

## 2019-03-17 DIAGNOSIS — R488 Other symbolic dysfunctions: Secondary | ICD-10-CM | POA: Diagnosis not present

## 2019-03-18 DIAGNOSIS — M6281 Muscle weakness (generalized): Secondary | ICD-10-CM | POA: Diagnosis not present

## 2019-03-18 DIAGNOSIS — R488 Other symbolic dysfunctions: Secondary | ICD-10-CM | POA: Diagnosis not present

## 2019-03-18 DIAGNOSIS — R2681 Unsteadiness on feet: Secondary | ICD-10-CM | POA: Diagnosis not present

## 2019-03-19 DIAGNOSIS — R2681 Unsteadiness on feet: Secondary | ICD-10-CM | POA: Diagnosis not present

## 2019-03-19 DIAGNOSIS — M6281 Muscle weakness (generalized): Secondary | ICD-10-CM | POA: Diagnosis not present

## 2019-03-19 DIAGNOSIS — R488 Other symbolic dysfunctions: Secondary | ICD-10-CM | POA: Diagnosis not present

## 2019-03-22 DIAGNOSIS — R2681 Unsteadiness on feet: Secondary | ICD-10-CM | POA: Diagnosis not present

## 2019-03-22 DIAGNOSIS — M6281 Muscle weakness (generalized): Secondary | ICD-10-CM | POA: Diagnosis not present

## 2019-03-22 DIAGNOSIS — R488 Other symbolic dysfunctions: Secondary | ICD-10-CM | POA: Diagnosis not present

## 2019-03-22 DIAGNOSIS — Z03818 Encounter for observation for suspected exposure to other biological agents ruled out: Secondary | ICD-10-CM | POA: Diagnosis not present

## 2019-03-23 DIAGNOSIS — R2681 Unsteadiness on feet: Secondary | ICD-10-CM | POA: Diagnosis not present

## 2019-03-23 DIAGNOSIS — M6281 Muscle weakness (generalized): Secondary | ICD-10-CM | POA: Diagnosis not present

## 2019-03-23 DIAGNOSIS — R488 Other symbolic dysfunctions: Secondary | ICD-10-CM | POA: Diagnosis not present

## 2019-03-24 DIAGNOSIS — R2681 Unsteadiness on feet: Secondary | ICD-10-CM | POA: Diagnosis not present

## 2019-03-24 DIAGNOSIS — R488 Other symbolic dysfunctions: Secondary | ICD-10-CM | POA: Diagnosis not present

## 2019-03-24 DIAGNOSIS — M6281 Muscle weakness (generalized): Secondary | ICD-10-CM | POA: Diagnosis not present

## 2019-03-25 DIAGNOSIS — R2681 Unsteadiness on feet: Secondary | ICD-10-CM | POA: Diagnosis not present

## 2019-03-25 DIAGNOSIS — R488 Other symbolic dysfunctions: Secondary | ICD-10-CM | POA: Diagnosis not present

## 2019-03-25 DIAGNOSIS — M6281 Muscle weakness (generalized): Secondary | ICD-10-CM | POA: Diagnosis not present

## 2019-03-26 DIAGNOSIS — R488 Other symbolic dysfunctions: Secondary | ICD-10-CM | POA: Diagnosis not present

## 2019-03-26 DIAGNOSIS — M6281 Muscle weakness (generalized): Secondary | ICD-10-CM | POA: Diagnosis not present

## 2019-03-26 DIAGNOSIS — R2681 Unsteadiness on feet: Secondary | ICD-10-CM | POA: Diagnosis not present

## 2019-03-29 DIAGNOSIS — M6281 Muscle weakness (generalized): Secondary | ICD-10-CM | POA: Diagnosis not present

## 2019-03-29 DIAGNOSIS — Z03818 Encounter for observation for suspected exposure to other biological agents ruled out: Secondary | ICD-10-CM | POA: Diagnosis not present

## 2019-03-29 DIAGNOSIS — R488 Other symbolic dysfunctions: Secondary | ICD-10-CM | POA: Diagnosis not present

## 2019-03-29 DIAGNOSIS — R2681 Unsteadiness on feet: Secondary | ICD-10-CM | POA: Diagnosis not present

## 2019-03-30 DIAGNOSIS — M6281 Muscle weakness (generalized): Secondary | ICD-10-CM | POA: Diagnosis not present

## 2019-03-30 DIAGNOSIS — R488 Other symbolic dysfunctions: Secondary | ICD-10-CM | POA: Diagnosis not present

## 2019-03-30 DIAGNOSIS — R2681 Unsteadiness on feet: Secondary | ICD-10-CM | POA: Diagnosis not present

## 2019-03-31 DIAGNOSIS — R2681 Unsteadiness on feet: Secondary | ICD-10-CM | POA: Diagnosis not present

## 2019-03-31 DIAGNOSIS — M6281 Muscle weakness (generalized): Secondary | ICD-10-CM | POA: Diagnosis not present

## 2019-03-31 DIAGNOSIS — R488 Other symbolic dysfunctions: Secondary | ICD-10-CM | POA: Diagnosis not present

## 2019-04-01 DIAGNOSIS — M6281 Muscle weakness (generalized): Secondary | ICD-10-CM | POA: Diagnosis not present

## 2019-04-01 DIAGNOSIS — R2681 Unsteadiness on feet: Secondary | ICD-10-CM | POA: Diagnosis not present

## 2019-04-01 DIAGNOSIS — R488 Other symbolic dysfunctions: Secondary | ICD-10-CM | POA: Diagnosis not present

## 2019-04-03 DIAGNOSIS — M6281 Muscle weakness (generalized): Secondary | ICD-10-CM | POA: Diagnosis not present

## 2019-04-03 DIAGNOSIS — R488 Other symbolic dysfunctions: Secondary | ICD-10-CM | POA: Diagnosis not present

## 2019-04-03 DIAGNOSIS — R2681 Unsteadiness on feet: Secondary | ICD-10-CM | POA: Diagnosis not present

## 2019-04-05 DIAGNOSIS — M6281 Muscle weakness (generalized): Secondary | ICD-10-CM | POA: Diagnosis not present

## 2019-04-05 DIAGNOSIS — R488 Other symbolic dysfunctions: Secondary | ICD-10-CM | POA: Diagnosis not present

## 2019-04-05 DIAGNOSIS — Z03818 Encounter for observation for suspected exposure to other biological agents ruled out: Secondary | ICD-10-CM | POA: Diagnosis not present

## 2019-04-05 DIAGNOSIS — R2681 Unsteadiness on feet: Secondary | ICD-10-CM | POA: Diagnosis not present

## 2019-04-07 DIAGNOSIS — R2681 Unsteadiness on feet: Secondary | ICD-10-CM | POA: Diagnosis not present

## 2019-04-07 DIAGNOSIS — R488 Other symbolic dysfunctions: Secondary | ICD-10-CM | POA: Diagnosis not present

## 2019-04-07 DIAGNOSIS — M6281 Muscle weakness (generalized): Secondary | ICD-10-CM | POA: Diagnosis not present

## 2019-04-08 ENCOUNTER — Telehealth: Payer: Self-pay | Admitting: Family Medicine

## 2019-04-08 DIAGNOSIS — R488 Other symbolic dysfunctions: Secondary | ICD-10-CM | POA: Diagnosis not present

## 2019-04-08 DIAGNOSIS — M6281 Muscle weakness (generalized): Secondary | ICD-10-CM | POA: Diagnosis not present

## 2019-04-08 DIAGNOSIS — R2681 Unsteadiness on feet: Secondary | ICD-10-CM | POA: Diagnosis not present

## 2019-04-08 NOTE — Telephone Encounter (Signed)
  I left a message asking the patient to call and schedule Medicare AWV with Loma Sousa ([place]).  If patient calls back, please schedule Medicare Wellness Visit on or after 04/12/2019. Last AWV 04/10/2018 [facility] ([place])

## 2019-04-09 DIAGNOSIS — R2681 Unsteadiness on feet: Secondary | ICD-10-CM | POA: Diagnosis not present

## 2019-04-09 DIAGNOSIS — R488 Other symbolic dysfunctions: Secondary | ICD-10-CM | POA: Diagnosis not present

## 2019-04-09 DIAGNOSIS — M6281 Muscle weakness (generalized): Secondary | ICD-10-CM | POA: Diagnosis not present

## 2019-04-09 DIAGNOSIS — Z23 Encounter for immunization: Secondary | ICD-10-CM | POA: Diagnosis not present

## 2019-04-10 DIAGNOSIS — R2681 Unsteadiness on feet: Secondary | ICD-10-CM | POA: Diagnosis not present

## 2019-04-10 DIAGNOSIS — R488 Other symbolic dysfunctions: Secondary | ICD-10-CM | POA: Diagnosis not present

## 2019-04-10 DIAGNOSIS — M6281 Muscle weakness (generalized): Secondary | ICD-10-CM | POA: Diagnosis not present

## 2019-04-12 DIAGNOSIS — Z03818 Encounter for observation for suspected exposure to other biological agents ruled out: Secondary | ICD-10-CM | POA: Diagnosis not present

## 2019-04-12 DIAGNOSIS — R2681 Unsteadiness on feet: Secondary | ICD-10-CM | POA: Diagnosis not present

## 2019-04-12 DIAGNOSIS — R488 Other symbolic dysfunctions: Secondary | ICD-10-CM | POA: Diagnosis not present

## 2019-04-12 DIAGNOSIS — M6281 Muscle weakness (generalized): Secondary | ICD-10-CM | POA: Diagnosis not present

## 2019-04-13 DIAGNOSIS — M6281 Muscle weakness (generalized): Secondary | ICD-10-CM | POA: Diagnosis not present

## 2019-04-13 DIAGNOSIS — R488 Other symbolic dysfunctions: Secondary | ICD-10-CM | POA: Diagnosis not present

## 2019-04-13 DIAGNOSIS — R2681 Unsteadiness on feet: Secondary | ICD-10-CM | POA: Diagnosis not present

## 2019-04-15 DIAGNOSIS — M6281 Muscle weakness (generalized): Secondary | ICD-10-CM | POA: Diagnosis not present

## 2019-04-15 DIAGNOSIS — R2681 Unsteadiness on feet: Secondary | ICD-10-CM | POA: Diagnosis not present

## 2019-04-15 DIAGNOSIS — R488 Other symbolic dysfunctions: Secondary | ICD-10-CM | POA: Diagnosis not present

## 2019-04-16 DIAGNOSIS — R2681 Unsteadiness on feet: Secondary | ICD-10-CM | POA: Diagnosis not present

## 2019-04-16 DIAGNOSIS — R488 Other symbolic dysfunctions: Secondary | ICD-10-CM | POA: Diagnosis not present

## 2019-04-16 DIAGNOSIS — M6281 Muscle weakness (generalized): Secondary | ICD-10-CM | POA: Diagnosis not present

## 2019-04-17 DIAGNOSIS — R488 Other symbolic dysfunctions: Secondary | ICD-10-CM | POA: Diagnosis not present

## 2019-04-17 DIAGNOSIS — M6281 Muscle weakness (generalized): Secondary | ICD-10-CM | POA: Diagnosis not present

## 2019-04-17 DIAGNOSIS — R2681 Unsteadiness on feet: Secondary | ICD-10-CM | POA: Diagnosis not present

## 2019-04-19 DIAGNOSIS — R2681 Unsteadiness on feet: Secondary | ICD-10-CM | POA: Diagnosis not present

## 2019-04-19 DIAGNOSIS — M6281 Muscle weakness (generalized): Secondary | ICD-10-CM | POA: Diagnosis not present

## 2019-04-19 DIAGNOSIS — Z03818 Encounter for observation for suspected exposure to other biological agents ruled out: Secondary | ICD-10-CM | POA: Diagnosis not present

## 2019-04-19 DIAGNOSIS — R488 Other symbolic dysfunctions: Secondary | ICD-10-CM | POA: Diagnosis not present

## 2019-04-20 DIAGNOSIS — R2681 Unsteadiness on feet: Secondary | ICD-10-CM | POA: Diagnosis not present

## 2019-04-20 DIAGNOSIS — R488 Other symbolic dysfunctions: Secondary | ICD-10-CM | POA: Diagnosis not present

## 2019-04-20 DIAGNOSIS — M6281 Muscle weakness (generalized): Secondary | ICD-10-CM | POA: Diagnosis not present

## 2019-04-22 DIAGNOSIS — M6281 Muscle weakness (generalized): Secondary | ICD-10-CM | POA: Diagnosis not present

## 2019-04-22 DIAGNOSIS — R488 Other symbolic dysfunctions: Secondary | ICD-10-CM | POA: Diagnosis not present

## 2019-04-22 DIAGNOSIS — R2681 Unsteadiness on feet: Secondary | ICD-10-CM | POA: Diagnosis not present

## 2019-04-26 DIAGNOSIS — M6281 Muscle weakness (generalized): Secondary | ICD-10-CM | POA: Diagnosis not present

## 2019-04-26 DIAGNOSIS — Z03818 Encounter for observation for suspected exposure to other biological agents ruled out: Secondary | ICD-10-CM | POA: Diagnosis not present

## 2019-04-26 DIAGNOSIS — R2681 Unsteadiness on feet: Secondary | ICD-10-CM | POA: Diagnosis not present

## 2019-04-26 DIAGNOSIS — R488 Other symbolic dysfunctions: Secondary | ICD-10-CM | POA: Diagnosis not present

## 2019-04-29 DIAGNOSIS — R2681 Unsteadiness on feet: Secondary | ICD-10-CM | POA: Diagnosis not present

## 2019-04-29 DIAGNOSIS — M6281 Muscle weakness (generalized): Secondary | ICD-10-CM | POA: Diagnosis not present

## 2019-04-29 DIAGNOSIS — R488 Other symbolic dysfunctions: Secondary | ICD-10-CM | POA: Diagnosis not present

## 2019-05-03 DIAGNOSIS — L814 Other melanin hyperpigmentation: Secondary | ICD-10-CM | POA: Diagnosis not present

## 2019-05-03 DIAGNOSIS — D485 Neoplasm of uncertain behavior of skin: Secondary | ICD-10-CM | POA: Diagnosis not present

## 2019-05-03 DIAGNOSIS — L821 Other seborrheic keratosis: Secondary | ICD-10-CM | POA: Diagnosis not present

## 2019-05-03 DIAGNOSIS — D229 Melanocytic nevi, unspecified: Secondary | ICD-10-CM | POA: Diagnosis not present

## 2019-05-04 DIAGNOSIS — C44622 Squamous cell carcinoma of skin of right upper limb, including shoulder: Secondary | ICD-10-CM | POA: Diagnosis not present

## 2019-05-04 DIAGNOSIS — L821 Other seborrheic keratosis: Secondary | ICD-10-CM | POA: Diagnosis not present

## 2019-05-04 DIAGNOSIS — C44619 Basal cell carcinoma of skin of left upper limb, including shoulder: Secondary | ICD-10-CM | POA: Diagnosis not present

## 2019-05-11 ENCOUNTER — Ambulatory Visit: Payer: Medicare Other | Admitting: Neurology

## 2019-05-12 DIAGNOSIS — Z03818 Encounter for observation for suspected exposure to other biological agents ruled out: Secondary | ICD-10-CM | POA: Diagnosis not present

## 2019-05-17 ENCOUNTER — Other Ambulatory Visit: Payer: Self-pay | Admitting: Family Medicine

## 2019-05-17 DIAGNOSIS — Z03818 Encounter for observation for suspected exposure to other biological agents ruled out: Secondary | ICD-10-CM | POA: Diagnosis not present

## 2019-05-31 ENCOUNTER — Telehealth: Payer: Self-pay | Admitting: Family Medicine

## 2019-05-31 NOTE — Telephone Encounter (Signed)
I left a message asking the patient to call and schedule Medicare AWV with Loma Sousa ([place]).  If patient calls back, please schedule Medicare Wellness Visit at next available opening. Last AWV 04/10/2018 [facility] ([place]ee[place]Dee)

## 2019-06-01 DIAGNOSIS — L905 Scar conditions and fibrosis of skin: Secondary | ICD-10-CM | POA: Diagnosis not present

## 2019-06-01 DIAGNOSIS — C44622 Squamous cell carcinoma of skin of right upper limb, including shoulder: Secondary | ICD-10-CM | POA: Diagnosis not present

## 2019-06-02 DIAGNOSIS — Z1159 Encounter for screening for other viral diseases: Secondary | ICD-10-CM | POA: Diagnosis not present

## 2019-06-02 DIAGNOSIS — Z20828 Contact with and (suspected) exposure to other viral communicable diseases: Secondary | ICD-10-CM | POA: Diagnosis not present

## 2019-06-09 DIAGNOSIS — F0390 Unspecified dementia without behavioral disturbance: Secondary | ICD-10-CM | POA: Diagnosis not present

## 2019-06-09 DIAGNOSIS — E559 Vitamin D deficiency, unspecified: Secondary | ICD-10-CM | POA: Diagnosis not present

## 2019-06-09 DIAGNOSIS — I10 Essential (primary) hypertension: Secondary | ICD-10-CM | POA: Diagnosis not present

## 2019-06-09 DIAGNOSIS — Z20828 Contact with and (suspected) exposure to other viral communicable diseases: Secondary | ICD-10-CM | POA: Diagnosis not present

## 2019-06-09 DIAGNOSIS — E539 Vitamin B deficiency, unspecified: Secondary | ICD-10-CM | POA: Diagnosis not present

## 2019-06-09 DIAGNOSIS — Z1159 Encounter for screening for other viral diseases: Secondary | ICD-10-CM | POA: Diagnosis not present

## 2019-06-10 DIAGNOSIS — D696 Thrombocytopenia, unspecified: Secondary | ICD-10-CM | POA: Diagnosis not present

## 2019-06-10 DIAGNOSIS — N390 Urinary tract infection, site not specified: Secondary | ICD-10-CM | POA: Diagnosis not present

## 2019-06-10 DIAGNOSIS — Z79899 Other long term (current) drug therapy: Secondary | ICD-10-CM | POA: Diagnosis not present

## 2019-06-16 DIAGNOSIS — Z20828 Contact with and (suspected) exposure to other viral communicable diseases: Secondary | ICD-10-CM | POA: Diagnosis not present

## 2019-06-16 DIAGNOSIS — Z1159 Encounter for screening for other viral diseases: Secondary | ICD-10-CM | POA: Diagnosis not present

## 2019-06-17 ENCOUNTER — Other Ambulatory Visit: Payer: Self-pay | Admitting: Family Medicine

## 2019-06-23 DIAGNOSIS — Z20828 Contact with and (suspected) exposure to other viral communicable diseases: Secondary | ICD-10-CM | POA: Diagnosis not present

## 2019-06-23 DIAGNOSIS — Z1159 Encounter for screening for other viral diseases: Secondary | ICD-10-CM | POA: Diagnosis not present

## 2019-06-30 DIAGNOSIS — Z1159 Encounter for screening for other viral diseases: Secondary | ICD-10-CM | POA: Diagnosis not present

## 2019-06-30 DIAGNOSIS — Z20828 Contact with and (suspected) exposure to other viral communicable diseases: Secondary | ICD-10-CM | POA: Diagnosis not present

## 2019-07-07 DIAGNOSIS — Z1159 Encounter for screening for other viral diseases: Secondary | ICD-10-CM | POA: Diagnosis not present

## 2019-07-07 DIAGNOSIS — Z20828 Contact with and (suspected) exposure to other viral communicable diseases: Secondary | ICD-10-CM | POA: Diagnosis not present

## 2019-07-08 ENCOUNTER — Other Ambulatory Visit: Payer: Self-pay | Admitting: Family Medicine

## 2019-07-08 DIAGNOSIS — F03B18 Unspecified dementia, moderate, with other behavioral disturbance: Secondary | ICD-10-CM

## 2019-07-12 ENCOUNTER — Other Ambulatory Visit: Payer: Self-pay | Admitting: Family Medicine

## 2019-07-12 NOTE — Telephone Encounter (Signed)
I will have pharmacy contact [facility] who prescribed this. I dont want him to run out though with history of barretts so make sure to give son an update on situation- we can fill if we really need to

## 2019-07-12 NOTE — Telephone Encounter (Signed)
Pt requesting refill on Nexium 40 mg BID. Please advise looks like you have not filled it before?

## 2019-07-14 DIAGNOSIS — Z20828 Contact with and (suspected) exposure to other viral communicable diseases: Secondary | ICD-10-CM | POA: Diagnosis not present

## 2019-07-14 DIAGNOSIS — I10 Essential (primary) hypertension: Secondary | ICD-10-CM | POA: Diagnosis not present

## 2019-07-14 DIAGNOSIS — F0390 Unspecified dementia without behavioral disturbance: Secondary | ICD-10-CM | POA: Diagnosis not present

## 2019-07-14 DIAGNOSIS — S40022A Contusion of left upper arm, initial encounter: Secondary | ICD-10-CM | POA: Diagnosis not present

## 2019-07-14 DIAGNOSIS — Z1159 Encounter for screening for other viral diseases: Secondary | ICD-10-CM | POA: Diagnosis not present

## 2019-07-21 DIAGNOSIS — Z1159 Encounter for screening for other viral diseases: Secondary | ICD-10-CM | POA: Diagnosis not present

## 2019-07-21 DIAGNOSIS — Z20828 Contact with and (suspected) exposure to other viral communicable diseases: Secondary | ICD-10-CM | POA: Diagnosis not present

## 2019-07-22 ENCOUNTER — Other Ambulatory Visit: Payer: Self-pay | Admitting: Family Medicine

## 2019-07-28 DIAGNOSIS — Z20828 Contact with and (suspected) exposure to other viral communicable diseases: Secondary | ICD-10-CM | POA: Diagnosis not present

## 2019-07-28 DIAGNOSIS — Z1159 Encounter for screening for other viral diseases: Secondary | ICD-10-CM | POA: Diagnosis not present

## 2019-08-04 DIAGNOSIS — Z20828 Contact with and (suspected) exposure to other viral communicable diseases: Secondary | ICD-10-CM | POA: Diagnosis not present

## 2019-08-04 DIAGNOSIS — Z1159 Encounter for screening for other viral diseases: Secondary | ICD-10-CM | POA: Diagnosis not present

## 2019-08-09 ENCOUNTER — Other Ambulatory Visit: Payer: Self-pay | Admitting: Family Medicine

## 2019-08-19 ENCOUNTER — Other Ambulatory Visit: Payer: Self-pay | Admitting: Family Medicine

## 2019-08-26 DIAGNOSIS — I10 Essential (primary) hypertension: Secondary | ICD-10-CM | POA: Diagnosis not present

## 2019-09-01 DIAGNOSIS — Z1159 Encounter for screening for other viral diseases: Secondary | ICD-10-CM | POA: Diagnosis not present

## 2019-09-01 DIAGNOSIS — Z20828 Contact with and (suspected) exposure to other viral communicable diseases: Secondary | ICD-10-CM | POA: Diagnosis not present

## 2019-09-07 DIAGNOSIS — R488 Other symbolic dysfunctions: Secondary | ICD-10-CM | POA: Diagnosis not present

## 2019-09-07 DIAGNOSIS — R2689 Other abnormalities of gait and mobility: Secondary | ICD-10-CM | POA: Diagnosis not present

## 2019-09-07 DIAGNOSIS — R2681 Unsteadiness on feet: Secondary | ICD-10-CM | POA: Diagnosis not present

## 2019-09-07 DIAGNOSIS — M6281 Muscle weakness (generalized): Secondary | ICD-10-CM | POA: Diagnosis not present

## 2019-09-08 DIAGNOSIS — R488 Other symbolic dysfunctions: Secondary | ICD-10-CM | POA: Diagnosis not present

## 2019-09-08 DIAGNOSIS — R2689 Other abnormalities of gait and mobility: Secondary | ICD-10-CM | POA: Diagnosis not present

## 2019-09-08 DIAGNOSIS — R2681 Unsteadiness on feet: Secondary | ICD-10-CM | POA: Diagnosis not present

## 2019-09-08 DIAGNOSIS — M6281 Muscle weakness (generalized): Secondary | ICD-10-CM | POA: Diagnosis not present

## 2019-09-08 DIAGNOSIS — Z20828 Contact with and (suspected) exposure to other viral communicable diseases: Secondary | ICD-10-CM | POA: Diagnosis not present

## 2019-09-08 DIAGNOSIS — Z1159 Encounter for screening for other viral diseases: Secondary | ICD-10-CM | POA: Diagnosis not present

## 2019-09-09 DIAGNOSIS — R2689 Other abnormalities of gait and mobility: Secondary | ICD-10-CM | POA: Diagnosis not present

## 2019-09-09 DIAGNOSIS — R488 Other symbolic dysfunctions: Secondary | ICD-10-CM | POA: Diagnosis not present

## 2019-09-09 DIAGNOSIS — R2681 Unsteadiness on feet: Secondary | ICD-10-CM | POA: Diagnosis not present

## 2019-09-09 DIAGNOSIS — M6281 Muscle weakness (generalized): Secondary | ICD-10-CM | POA: Diagnosis not present

## 2019-09-10 DIAGNOSIS — R2689 Other abnormalities of gait and mobility: Secondary | ICD-10-CM | POA: Diagnosis not present

## 2019-09-10 DIAGNOSIS — R488 Other symbolic dysfunctions: Secondary | ICD-10-CM | POA: Diagnosis not present

## 2019-09-10 DIAGNOSIS — R2681 Unsteadiness on feet: Secondary | ICD-10-CM | POA: Diagnosis not present

## 2019-09-10 DIAGNOSIS — M6281 Muscle weakness (generalized): Secondary | ICD-10-CM | POA: Diagnosis not present

## 2019-09-11 DIAGNOSIS — R488 Other symbolic dysfunctions: Secondary | ICD-10-CM | POA: Diagnosis not present

## 2019-09-11 DIAGNOSIS — R2681 Unsteadiness on feet: Secondary | ICD-10-CM | POA: Diagnosis not present

## 2019-09-11 DIAGNOSIS — R2689 Other abnormalities of gait and mobility: Secondary | ICD-10-CM | POA: Diagnosis not present

## 2019-09-11 DIAGNOSIS — M6281 Muscle weakness (generalized): Secondary | ICD-10-CM | POA: Diagnosis not present

## 2019-09-13 DIAGNOSIS — R488 Other symbolic dysfunctions: Secondary | ICD-10-CM | POA: Diagnosis not present

## 2019-09-13 DIAGNOSIS — R2689 Other abnormalities of gait and mobility: Secondary | ICD-10-CM | POA: Diagnosis not present

## 2019-09-13 DIAGNOSIS — R2681 Unsteadiness on feet: Secondary | ICD-10-CM | POA: Diagnosis not present

## 2019-09-13 DIAGNOSIS — M6281 Muscle weakness (generalized): Secondary | ICD-10-CM | POA: Diagnosis not present

## 2019-09-15 DIAGNOSIS — Z20828 Contact with and (suspected) exposure to other viral communicable diseases: Secondary | ICD-10-CM | POA: Diagnosis not present

## 2019-09-15 DIAGNOSIS — R2689 Other abnormalities of gait and mobility: Secondary | ICD-10-CM | POA: Diagnosis not present

## 2019-09-15 DIAGNOSIS — R488 Other symbolic dysfunctions: Secondary | ICD-10-CM | POA: Diagnosis not present

## 2019-09-15 DIAGNOSIS — R2681 Unsteadiness on feet: Secondary | ICD-10-CM | POA: Diagnosis not present

## 2019-09-15 DIAGNOSIS — M6281 Muscle weakness (generalized): Secondary | ICD-10-CM | POA: Diagnosis not present

## 2019-09-15 DIAGNOSIS — Z1159 Encounter for screening for other viral diseases: Secondary | ICD-10-CM | POA: Diagnosis not present

## 2019-09-17 DIAGNOSIS — R488 Other symbolic dysfunctions: Secondary | ICD-10-CM | POA: Diagnosis not present

## 2019-09-17 DIAGNOSIS — R2689 Other abnormalities of gait and mobility: Secondary | ICD-10-CM | POA: Diagnosis not present

## 2019-09-17 DIAGNOSIS — M6281 Muscle weakness (generalized): Secondary | ICD-10-CM | POA: Diagnosis not present

## 2019-09-17 DIAGNOSIS — R2681 Unsteadiness on feet: Secondary | ICD-10-CM | POA: Diagnosis not present

## 2019-09-20 DIAGNOSIS — R2689 Other abnormalities of gait and mobility: Secondary | ICD-10-CM | POA: Diagnosis not present

## 2019-09-20 DIAGNOSIS — M6281 Muscle weakness (generalized): Secondary | ICD-10-CM | POA: Diagnosis not present

## 2019-09-20 DIAGNOSIS — R2681 Unsteadiness on feet: Secondary | ICD-10-CM | POA: Diagnosis not present

## 2019-09-20 DIAGNOSIS — R488 Other symbolic dysfunctions: Secondary | ICD-10-CM | POA: Diagnosis not present

## 2019-09-21 DIAGNOSIS — R2681 Unsteadiness on feet: Secondary | ICD-10-CM | POA: Diagnosis not present

## 2019-09-21 DIAGNOSIS — R2689 Other abnormalities of gait and mobility: Secondary | ICD-10-CM | POA: Diagnosis not present

## 2019-09-21 DIAGNOSIS — R488 Other symbolic dysfunctions: Secondary | ICD-10-CM | POA: Diagnosis not present

## 2019-09-21 DIAGNOSIS — M6281 Muscle weakness (generalized): Secondary | ICD-10-CM | POA: Diagnosis not present

## 2019-09-22 DIAGNOSIS — Z20828 Contact with and (suspected) exposure to other viral communicable diseases: Secondary | ICD-10-CM | POA: Diagnosis not present

## 2019-09-22 DIAGNOSIS — Z1159 Encounter for screening for other viral diseases: Secondary | ICD-10-CM | POA: Diagnosis not present

## 2019-09-24 DIAGNOSIS — R488 Other symbolic dysfunctions: Secondary | ICD-10-CM | POA: Diagnosis not present

## 2019-09-24 DIAGNOSIS — R2681 Unsteadiness on feet: Secondary | ICD-10-CM | POA: Diagnosis not present

## 2019-09-24 DIAGNOSIS — M6281 Muscle weakness (generalized): Secondary | ICD-10-CM | POA: Diagnosis not present

## 2019-09-24 DIAGNOSIS — R2689 Other abnormalities of gait and mobility: Secondary | ICD-10-CM | POA: Diagnosis not present

## 2019-09-27 DIAGNOSIS — R2689 Other abnormalities of gait and mobility: Secondary | ICD-10-CM | POA: Diagnosis not present

## 2019-09-27 DIAGNOSIS — R41841 Cognitive communication deficit: Secondary | ICD-10-CM | POA: Diagnosis not present

## 2019-09-27 DIAGNOSIS — R488 Other symbolic dysfunctions: Secondary | ICD-10-CM | POA: Diagnosis not present

## 2019-09-27 DIAGNOSIS — M6281 Muscle weakness (generalized): Secondary | ICD-10-CM | POA: Diagnosis not present

## 2019-09-27 DIAGNOSIS — R1312 Dysphagia, oropharyngeal phase: Secondary | ICD-10-CM | POA: Diagnosis not present

## 2019-09-27 DIAGNOSIS — R2681 Unsteadiness on feet: Secondary | ICD-10-CM | POA: Diagnosis not present

## 2019-09-28 DIAGNOSIS — R1312 Dysphagia, oropharyngeal phase: Secondary | ICD-10-CM | POA: Diagnosis not present

## 2019-09-28 DIAGNOSIS — R41841 Cognitive communication deficit: Secondary | ICD-10-CM | POA: Diagnosis not present

## 2019-09-28 DIAGNOSIS — R2689 Other abnormalities of gait and mobility: Secondary | ICD-10-CM | POA: Diagnosis not present

## 2019-09-28 DIAGNOSIS — R2681 Unsteadiness on feet: Secondary | ICD-10-CM | POA: Diagnosis not present

## 2019-09-28 DIAGNOSIS — M6281 Muscle weakness (generalized): Secondary | ICD-10-CM | POA: Diagnosis not present

## 2019-09-28 DIAGNOSIS — R488 Other symbolic dysfunctions: Secondary | ICD-10-CM | POA: Diagnosis not present

## 2019-09-29 DIAGNOSIS — R2689 Other abnormalities of gait and mobility: Secondary | ICD-10-CM | POA: Diagnosis not present

## 2019-09-29 DIAGNOSIS — M6281 Muscle weakness (generalized): Secondary | ICD-10-CM | POA: Diagnosis not present

## 2019-09-29 DIAGNOSIS — Z1159 Encounter for screening for other viral diseases: Secondary | ICD-10-CM | POA: Diagnosis not present

## 2019-09-29 DIAGNOSIS — R41841 Cognitive communication deficit: Secondary | ICD-10-CM | POA: Diagnosis not present

## 2019-09-29 DIAGNOSIS — R2681 Unsteadiness on feet: Secondary | ICD-10-CM | POA: Diagnosis not present

## 2019-09-29 DIAGNOSIS — Z20828 Contact with and (suspected) exposure to other viral communicable diseases: Secondary | ICD-10-CM | POA: Diagnosis not present

## 2019-09-29 DIAGNOSIS — R488 Other symbolic dysfunctions: Secondary | ICD-10-CM | POA: Diagnosis not present

## 2019-09-29 DIAGNOSIS — R1312 Dysphagia, oropharyngeal phase: Secondary | ICD-10-CM | POA: Diagnosis not present

## 2019-09-30 DIAGNOSIS — R488 Other symbolic dysfunctions: Secondary | ICD-10-CM | POA: Diagnosis not present

## 2019-09-30 DIAGNOSIS — R2681 Unsteadiness on feet: Secondary | ICD-10-CM | POA: Diagnosis not present

## 2019-09-30 DIAGNOSIS — R2689 Other abnormalities of gait and mobility: Secondary | ICD-10-CM | POA: Diagnosis not present

## 2019-09-30 DIAGNOSIS — R41841 Cognitive communication deficit: Secondary | ICD-10-CM | POA: Diagnosis not present

## 2019-09-30 DIAGNOSIS — R1312 Dysphagia, oropharyngeal phase: Secondary | ICD-10-CM | POA: Diagnosis not present

## 2019-09-30 DIAGNOSIS — M6281 Muscle weakness (generalized): Secondary | ICD-10-CM | POA: Diagnosis not present

## 2019-09-30 DIAGNOSIS — D696 Thrombocytopenia, unspecified: Secondary | ICD-10-CM | POA: Diagnosis not present

## 2019-10-01 DIAGNOSIS — R41841 Cognitive communication deficit: Secondary | ICD-10-CM | POA: Diagnosis not present

## 2019-10-01 DIAGNOSIS — M6281 Muscle weakness (generalized): Secondary | ICD-10-CM | POA: Diagnosis not present

## 2019-10-01 DIAGNOSIS — R2689 Other abnormalities of gait and mobility: Secondary | ICD-10-CM | POA: Diagnosis not present

## 2019-10-01 DIAGNOSIS — R2681 Unsteadiness on feet: Secondary | ICD-10-CM | POA: Diagnosis not present

## 2019-10-01 DIAGNOSIS — R488 Other symbolic dysfunctions: Secondary | ICD-10-CM | POA: Diagnosis not present

## 2019-10-01 DIAGNOSIS — R1312 Dysphagia, oropharyngeal phase: Secondary | ICD-10-CM | POA: Diagnosis not present

## 2019-10-04 DIAGNOSIS — R488 Other symbolic dysfunctions: Secondary | ICD-10-CM | POA: Diagnosis not present

## 2019-10-04 DIAGNOSIS — R1312 Dysphagia, oropharyngeal phase: Secondary | ICD-10-CM | POA: Diagnosis not present

## 2019-10-04 DIAGNOSIS — M6281 Muscle weakness (generalized): Secondary | ICD-10-CM | POA: Diagnosis not present

## 2019-10-04 DIAGNOSIS — R2689 Other abnormalities of gait and mobility: Secondary | ICD-10-CM | POA: Diagnosis not present

## 2019-10-04 DIAGNOSIS — R41841 Cognitive communication deficit: Secondary | ICD-10-CM | POA: Diagnosis not present

## 2019-10-04 DIAGNOSIS — R2681 Unsteadiness on feet: Secondary | ICD-10-CM | POA: Diagnosis not present

## 2019-10-05 DIAGNOSIS — R41841 Cognitive communication deficit: Secondary | ICD-10-CM | POA: Diagnosis not present

## 2019-10-05 DIAGNOSIS — R2689 Other abnormalities of gait and mobility: Secondary | ICD-10-CM | POA: Diagnosis not present

## 2019-10-05 DIAGNOSIS — R1312 Dysphagia, oropharyngeal phase: Secondary | ICD-10-CM | POA: Diagnosis not present

## 2019-10-05 DIAGNOSIS — R2681 Unsteadiness on feet: Secondary | ICD-10-CM | POA: Diagnosis not present

## 2019-10-05 DIAGNOSIS — M6281 Muscle weakness (generalized): Secondary | ICD-10-CM | POA: Diagnosis not present

## 2019-10-05 DIAGNOSIS — R488 Other symbolic dysfunctions: Secondary | ICD-10-CM | POA: Diagnosis not present

## 2019-10-06 DIAGNOSIS — D539 Nutritional anemia, unspecified: Secondary | ICD-10-CM | POA: Diagnosis not present

## 2019-10-06 DIAGNOSIS — R251 Tremor, unspecified: Secondary | ICD-10-CM | POA: Diagnosis not present

## 2019-10-06 DIAGNOSIS — R2689 Other abnormalities of gait and mobility: Secondary | ICD-10-CM | POA: Diagnosis not present

## 2019-10-06 DIAGNOSIS — R41841 Cognitive communication deficit: Secondary | ICD-10-CM | POA: Diagnosis not present

## 2019-10-06 DIAGNOSIS — F4489 Other dissociative and conversion disorders: Secondary | ICD-10-CM | POA: Diagnosis not present

## 2019-10-06 DIAGNOSIS — R1312 Dysphagia, oropharyngeal phase: Secondary | ICD-10-CM | POA: Diagnosis not present

## 2019-10-06 DIAGNOSIS — M6281 Muscle weakness (generalized): Secondary | ICD-10-CM | POA: Diagnosis not present

## 2019-10-06 DIAGNOSIS — R488 Other symbolic dysfunctions: Secondary | ICD-10-CM | POA: Diagnosis not present

## 2019-10-06 DIAGNOSIS — Z1159 Encounter for screening for other viral diseases: Secondary | ICD-10-CM | POA: Diagnosis not present

## 2019-10-06 DIAGNOSIS — R2681 Unsteadiness on feet: Secondary | ICD-10-CM | POA: Diagnosis not present

## 2019-10-06 DIAGNOSIS — Z20828 Contact with and (suspected) exposure to other viral communicable diseases: Secondary | ICD-10-CM | POA: Diagnosis not present

## 2019-10-06 DIAGNOSIS — I10 Essential (primary) hypertension: Secondary | ICD-10-CM | POA: Diagnosis not present

## 2019-10-07 DIAGNOSIS — R1312 Dysphagia, oropharyngeal phase: Secondary | ICD-10-CM | POA: Diagnosis not present

## 2019-10-07 DIAGNOSIS — R488 Other symbolic dysfunctions: Secondary | ICD-10-CM | POA: Diagnosis not present

## 2019-10-07 DIAGNOSIS — R2681 Unsteadiness on feet: Secondary | ICD-10-CM | POA: Diagnosis not present

## 2019-10-07 DIAGNOSIS — R2689 Other abnormalities of gait and mobility: Secondary | ICD-10-CM | POA: Diagnosis not present

## 2019-10-07 DIAGNOSIS — R41841 Cognitive communication deficit: Secondary | ICD-10-CM | POA: Diagnosis not present

## 2019-10-07 DIAGNOSIS — M6281 Muscle weakness (generalized): Secondary | ICD-10-CM | POA: Diagnosis not present

## 2019-10-08 DIAGNOSIS — R1312 Dysphagia, oropharyngeal phase: Secondary | ICD-10-CM | POA: Diagnosis not present

## 2019-10-08 DIAGNOSIS — R2689 Other abnormalities of gait and mobility: Secondary | ICD-10-CM | POA: Diagnosis not present

## 2019-10-08 DIAGNOSIS — R2681 Unsteadiness on feet: Secondary | ICD-10-CM | POA: Diagnosis not present

## 2019-10-08 DIAGNOSIS — R488 Other symbolic dysfunctions: Secondary | ICD-10-CM | POA: Diagnosis not present

## 2019-10-08 DIAGNOSIS — M6281 Muscle weakness (generalized): Secondary | ICD-10-CM | POA: Diagnosis not present

## 2019-10-08 DIAGNOSIS — R41841 Cognitive communication deficit: Secondary | ICD-10-CM | POA: Diagnosis not present

## 2019-10-11 DIAGNOSIS — M6281 Muscle weakness (generalized): Secondary | ICD-10-CM | POA: Diagnosis not present

## 2019-10-11 DIAGNOSIS — R1312 Dysphagia, oropharyngeal phase: Secondary | ICD-10-CM | POA: Diagnosis not present

## 2019-10-11 DIAGNOSIS — R488 Other symbolic dysfunctions: Secondary | ICD-10-CM | POA: Diagnosis not present

## 2019-10-11 DIAGNOSIS — R2689 Other abnormalities of gait and mobility: Secondary | ICD-10-CM | POA: Diagnosis not present

## 2019-10-11 DIAGNOSIS — R41841 Cognitive communication deficit: Secondary | ICD-10-CM | POA: Diagnosis not present

## 2019-10-11 DIAGNOSIS — R2681 Unsteadiness on feet: Secondary | ICD-10-CM | POA: Diagnosis not present

## 2019-10-12 DIAGNOSIS — R2681 Unsteadiness on feet: Secondary | ICD-10-CM | POA: Diagnosis not present

## 2019-10-12 DIAGNOSIS — R41841 Cognitive communication deficit: Secondary | ICD-10-CM | POA: Diagnosis not present

## 2019-10-12 DIAGNOSIS — R2689 Other abnormalities of gait and mobility: Secondary | ICD-10-CM | POA: Diagnosis not present

## 2019-10-12 DIAGNOSIS — R1312 Dysphagia, oropharyngeal phase: Secondary | ICD-10-CM | POA: Diagnosis not present

## 2019-10-12 DIAGNOSIS — R488 Other symbolic dysfunctions: Secondary | ICD-10-CM | POA: Diagnosis not present

## 2019-10-12 DIAGNOSIS — M6281 Muscle weakness (generalized): Secondary | ICD-10-CM | POA: Diagnosis not present

## 2019-10-13 DIAGNOSIS — M6281 Muscle weakness (generalized): Secondary | ICD-10-CM | POA: Diagnosis not present

## 2019-10-13 DIAGNOSIS — R1312 Dysphagia, oropharyngeal phase: Secondary | ICD-10-CM | POA: Diagnosis not present

## 2019-10-13 DIAGNOSIS — Z1159 Encounter for screening for other viral diseases: Secondary | ICD-10-CM | POA: Diagnosis not present

## 2019-10-13 DIAGNOSIS — R488 Other symbolic dysfunctions: Secondary | ICD-10-CM | POA: Diagnosis not present

## 2019-10-13 DIAGNOSIS — E559 Vitamin D deficiency, unspecified: Secondary | ICD-10-CM | POA: Diagnosis not present

## 2019-10-13 DIAGNOSIS — R41841 Cognitive communication deficit: Secondary | ICD-10-CM | POA: Diagnosis not present

## 2019-10-13 DIAGNOSIS — W19XXXA Unspecified fall, initial encounter: Secondary | ICD-10-CM | POA: Diagnosis not present

## 2019-10-13 DIAGNOSIS — Z20828 Contact with and (suspected) exposure to other viral communicable diseases: Secondary | ICD-10-CM | POA: Diagnosis not present

## 2019-10-13 DIAGNOSIS — Z79899 Other long term (current) drug therapy: Secondary | ICD-10-CM | POA: Diagnosis not present

## 2019-10-13 DIAGNOSIS — R2689 Other abnormalities of gait and mobility: Secondary | ICD-10-CM | POA: Diagnosis not present

## 2019-10-13 DIAGNOSIS — R2681 Unsteadiness on feet: Secondary | ICD-10-CM | POA: Diagnosis not present

## 2019-10-13 DIAGNOSIS — F329 Major depressive disorder, single episode, unspecified: Secondary | ICD-10-CM | POA: Diagnosis not present

## 2019-10-14 DIAGNOSIS — R41841 Cognitive communication deficit: Secondary | ICD-10-CM | POA: Diagnosis not present

## 2019-10-14 DIAGNOSIS — R1312 Dysphagia, oropharyngeal phase: Secondary | ICD-10-CM | POA: Diagnosis not present

## 2019-10-14 DIAGNOSIS — R2681 Unsteadiness on feet: Secondary | ICD-10-CM | POA: Diagnosis not present

## 2019-10-14 DIAGNOSIS — R488 Other symbolic dysfunctions: Secondary | ICD-10-CM | POA: Diagnosis not present

## 2019-10-14 DIAGNOSIS — Z79899 Other long term (current) drug therapy: Secondary | ICD-10-CM | POA: Diagnosis not present

## 2019-10-14 DIAGNOSIS — N390 Urinary tract infection, site not specified: Secondary | ICD-10-CM | POA: Diagnosis not present

## 2019-10-14 DIAGNOSIS — M6281 Muscle weakness (generalized): Secondary | ICD-10-CM | POA: Diagnosis not present

## 2019-10-14 DIAGNOSIS — R2689 Other abnormalities of gait and mobility: Secondary | ICD-10-CM | POA: Diagnosis not present

## 2019-10-15 DIAGNOSIS — Z79899 Other long term (current) drug therapy: Secondary | ICD-10-CM | POA: Diagnosis not present

## 2019-10-15 DIAGNOSIS — R488 Other symbolic dysfunctions: Secondary | ICD-10-CM | POA: Diagnosis not present

## 2019-10-15 DIAGNOSIS — N390 Urinary tract infection, site not specified: Secondary | ICD-10-CM | POA: Diagnosis not present

## 2019-10-15 DIAGNOSIS — R2689 Other abnormalities of gait and mobility: Secondary | ICD-10-CM | POA: Diagnosis not present

## 2019-10-15 DIAGNOSIS — R41841 Cognitive communication deficit: Secondary | ICD-10-CM | POA: Diagnosis not present

## 2019-10-15 DIAGNOSIS — R2681 Unsteadiness on feet: Secondary | ICD-10-CM | POA: Diagnosis not present

## 2019-10-15 DIAGNOSIS — M6281 Muscle weakness (generalized): Secondary | ICD-10-CM | POA: Diagnosis not present

## 2019-10-15 DIAGNOSIS — R1312 Dysphagia, oropharyngeal phase: Secondary | ICD-10-CM | POA: Diagnosis not present

## 2019-10-18 DIAGNOSIS — R1312 Dysphagia, oropharyngeal phase: Secondary | ICD-10-CM | POA: Diagnosis not present

## 2019-10-18 DIAGNOSIS — R41841 Cognitive communication deficit: Secondary | ICD-10-CM | POA: Diagnosis not present

## 2019-10-18 DIAGNOSIS — R488 Other symbolic dysfunctions: Secondary | ICD-10-CM | POA: Diagnosis not present

## 2019-10-18 DIAGNOSIS — R2681 Unsteadiness on feet: Secondary | ICD-10-CM | POA: Diagnosis not present

## 2019-10-18 DIAGNOSIS — R2689 Other abnormalities of gait and mobility: Secondary | ICD-10-CM | POA: Diagnosis not present

## 2019-10-18 DIAGNOSIS — M6281 Muscle weakness (generalized): Secondary | ICD-10-CM | POA: Diagnosis not present

## 2019-10-19 DIAGNOSIS — R488 Other symbolic dysfunctions: Secondary | ICD-10-CM | POA: Diagnosis not present

## 2019-10-19 DIAGNOSIS — M6281 Muscle weakness (generalized): Secondary | ICD-10-CM | POA: Diagnosis not present

## 2019-10-19 DIAGNOSIS — R41841 Cognitive communication deficit: Secondary | ICD-10-CM | POA: Diagnosis not present

## 2019-10-19 DIAGNOSIS — R2681 Unsteadiness on feet: Secondary | ICD-10-CM | POA: Diagnosis not present

## 2019-10-19 DIAGNOSIS — R1312 Dysphagia, oropharyngeal phase: Secondary | ICD-10-CM | POA: Diagnosis not present

## 2019-10-19 DIAGNOSIS — R2689 Other abnormalities of gait and mobility: Secondary | ICD-10-CM | POA: Diagnosis not present

## 2019-10-20 DIAGNOSIS — Z1159 Encounter for screening for other viral diseases: Secondary | ICD-10-CM | POA: Diagnosis not present

## 2019-10-20 DIAGNOSIS — R2689 Other abnormalities of gait and mobility: Secondary | ICD-10-CM | POA: Diagnosis not present

## 2019-10-20 DIAGNOSIS — N390 Urinary tract infection, site not specified: Secondary | ICD-10-CM | POA: Diagnosis not present

## 2019-10-20 DIAGNOSIS — R1319 Other dysphagia: Secondary | ICD-10-CM | POA: Diagnosis not present

## 2019-10-20 DIAGNOSIS — G250 Essential tremor: Secondary | ICD-10-CM | POA: Diagnosis not present

## 2019-10-20 DIAGNOSIS — R2681 Unsteadiness on feet: Secondary | ICD-10-CM | POA: Diagnosis not present

## 2019-10-20 DIAGNOSIS — R1312 Dysphagia, oropharyngeal phase: Secondary | ICD-10-CM | POA: Diagnosis not present

## 2019-10-20 DIAGNOSIS — F0391 Unspecified dementia with behavioral disturbance: Secondary | ICD-10-CM | POA: Diagnosis not present

## 2019-10-20 DIAGNOSIS — R488 Other symbolic dysfunctions: Secondary | ICD-10-CM | POA: Diagnosis not present

## 2019-10-20 DIAGNOSIS — Z20828 Contact with and (suspected) exposure to other viral communicable diseases: Secondary | ICD-10-CM | POA: Diagnosis not present

## 2019-10-20 DIAGNOSIS — M6281 Muscle weakness (generalized): Secondary | ICD-10-CM | POA: Diagnosis not present

## 2019-10-20 DIAGNOSIS — R41841 Cognitive communication deficit: Secondary | ICD-10-CM | POA: Diagnosis not present

## 2019-10-21 DIAGNOSIS — Z79899 Other long term (current) drug therapy: Secondary | ICD-10-CM | POA: Diagnosis not present

## 2019-10-21 DIAGNOSIS — R2689 Other abnormalities of gait and mobility: Secondary | ICD-10-CM | POA: Diagnosis not present

## 2019-10-21 DIAGNOSIS — R41841 Cognitive communication deficit: Secondary | ICD-10-CM | POA: Diagnosis not present

## 2019-10-21 DIAGNOSIS — E559 Vitamin D deficiency, unspecified: Secondary | ICD-10-CM | POA: Diagnosis not present

## 2019-10-21 DIAGNOSIS — D539 Nutritional anemia, unspecified: Secondary | ICD-10-CM | POA: Diagnosis not present

## 2019-10-21 DIAGNOSIS — R488 Other symbolic dysfunctions: Secondary | ICD-10-CM | POA: Diagnosis not present

## 2019-10-21 DIAGNOSIS — M6281 Muscle weakness (generalized): Secondary | ICD-10-CM | POA: Diagnosis not present

## 2019-10-21 DIAGNOSIS — R1312 Dysphagia, oropharyngeal phase: Secondary | ICD-10-CM | POA: Diagnosis not present

## 2019-10-21 DIAGNOSIS — R2681 Unsteadiness on feet: Secondary | ICD-10-CM | POA: Diagnosis not present

## 2019-10-25 DIAGNOSIS — R488 Other symbolic dysfunctions: Secondary | ICD-10-CM | POA: Diagnosis not present

## 2019-10-25 DIAGNOSIS — R41841 Cognitive communication deficit: Secondary | ICD-10-CM | POA: Diagnosis not present

## 2019-10-25 DIAGNOSIS — R2689 Other abnormalities of gait and mobility: Secondary | ICD-10-CM | POA: Diagnosis not present

## 2019-10-25 DIAGNOSIS — R1312 Dysphagia, oropharyngeal phase: Secondary | ICD-10-CM | POA: Diagnosis not present

## 2019-10-25 DIAGNOSIS — R2681 Unsteadiness on feet: Secondary | ICD-10-CM | POA: Diagnosis not present

## 2019-10-25 DIAGNOSIS — M6281 Muscle weakness (generalized): Secondary | ICD-10-CM | POA: Diagnosis not present

## 2019-10-26 DIAGNOSIS — R2681 Unsteadiness on feet: Secondary | ICD-10-CM | POA: Diagnosis not present

## 2019-10-26 DIAGNOSIS — R41841 Cognitive communication deficit: Secondary | ICD-10-CM | POA: Diagnosis not present

## 2019-10-26 DIAGNOSIS — R2689 Other abnormalities of gait and mobility: Secondary | ICD-10-CM | POA: Diagnosis not present

## 2019-10-26 DIAGNOSIS — R1312 Dysphagia, oropharyngeal phase: Secondary | ICD-10-CM | POA: Diagnosis not present

## 2019-10-26 DIAGNOSIS — M6281 Muscle weakness (generalized): Secondary | ICD-10-CM | POA: Diagnosis not present

## 2019-10-26 DIAGNOSIS — R488 Other symbolic dysfunctions: Secondary | ICD-10-CM | POA: Diagnosis not present

## 2019-10-27 DIAGNOSIS — R488 Other symbolic dysfunctions: Secondary | ICD-10-CM | POA: Diagnosis not present

## 2019-10-27 DIAGNOSIS — R2681 Unsteadiness on feet: Secondary | ICD-10-CM | POA: Diagnosis not present

## 2019-10-27 DIAGNOSIS — R1312 Dysphagia, oropharyngeal phase: Secondary | ICD-10-CM | POA: Diagnosis not present

## 2019-10-27 DIAGNOSIS — Z1159 Encounter for screening for other viral diseases: Secondary | ICD-10-CM | POA: Diagnosis not present

## 2019-10-27 DIAGNOSIS — R41841 Cognitive communication deficit: Secondary | ICD-10-CM | POA: Diagnosis not present

## 2019-10-27 DIAGNOSIS — R2689 Other abnormalities of gait and mobility: Secondary | ICD-10-CM | POA: Diagnosis not present

## 2019-10-27 DIAGNOSIS — M6281 Muscle weakness (generalized): Secondary | ICD-10-CM | POA: Diagnosis not present

## 2019-10-27 DIAGNOSIS — Z20828 Contact with and (suspected) exposure to other viral communicable diseases: Secondary | ICD-10-CM | POA: Diagnosis not present

## 2019-10-28 DIAGNOSIS — R2689 Other abnormalities of gait and mobility: Secondary | ICD-10-CM | POA: Diagnosis not present

## 2019-10-28 DIAGNOSIS — R488 Other symbolic dysfunctions: Secondary | ICD-10-CM | POA: Diagnosis not present

## 2019-10-28 DIAGNOSIS — M6281 Muscle weakness (generalized): Secondary | ICD-10-CM | POA: Diagnosis not present

## 2019-10-28 DIAGNOSIS — R1312 Dysphagia, oropharyngeal phase: Secondary | ICD-10-CM | POA: Diagnosis not present

## 2019-10-28 DIAGNOSIS — R2681 Unsteadiness on feet: Secondary | ICD-10-CM | POA: Diagnosis not present

## 2019-10-28 DIAGNOSIS — R41841 Cognitive communication deficit: Secondary | ICD-10-CM | POA: Diagnosis not present

## 2019-10-30 DIAGNOSIS — R41841 Cognitive communication deficit: Secondary | ICD-10-CM | POA: Diagnosis not present

## 2019-10-30 DIAGNOSIS — M6281 Muscle weakness (generalized): Secondary | ICD-10-CM | POA: Diagnosis not present

## 2019-10-30 DIAGNOSIS — R488 Other symbolic dysfunctions: Secondary | ICD-10-CM | POA: Diagnosis not present

## 2019-10-30 DIAGNOSIS — R2689 Other abnormalities of gait and mobility: Secondary | ICD-10-CM | POA: Diagnosis not present

## 2019-10-30 DIAGNOSIS — R1312 Dysphagia, oropharyngeal phase: Secondary | ICD-10-CM | POA: Diagnosis not present

## 2019-10-30 DIAGNOSIS — R2681 Unsteadiness on feet: Secondary | ICD-10-CM | POA: Diagnosis not present

## 2019-11-02 DIAGNOSIS — R2689 Other abnormalities of gait and mobility: Secondary | ICD-10-CM | POA: Diagnosis not present

## 2019-11-02 DIAGNOSIS — R41841 Cognitive communication deficit: Secondary | ICD-10-CM | POA: Diagnosis not present

## 2019-11-02 DIAGNOSIS — R488 Other symbolic dysfunctions: Secondary | ICD-10-CM | POA: Diagnosis not present

## 2019-11-02 DIAGNOSIS — R1312 Dysphagia, oropharyngeal phase: Secondary | ICD-10-CM | POA: Diagnosis not present

## 2019-11-02 DIAGNOSIS — M6281 Muscle weakness (generalized): Secondary | ICD-10-CM | POA: Diagnosis not present

## 2019-11-02 DIAGNOSIS — R2681 Unsteadiness on feet: Secondary | ICD-10-CM | POA: Diagnosis not present

## 2019-11-03 DIAGNOSIS — R1312 Dysphagia, oropharyngeal phase: Secondary | ICD-10-CM | POA: Diagnosis not present

## 2019-11-03 DIAGNOSIS — R2689 Other abnormalities of gait and mobility: Secondary | ICD-10-CM | POA: Diagnosis not present

## 2019-11-03 DIAGNOSIS — M6281 Muscle weakness (generalized): Secondary | ICD-10-CM | POA: Diagnosis not present

## 2019-11-03 DIAGNOSIS — R488 Other symbolic dysfunctions: Secondary | ICD-10-CM | POA: Diagnosis not present

## 2019-11-03 DIAGNOSIS — Z20828 Contact with and (suspected) exposure to other viral communicable diseases: Secondary | ICD-10-CM | POA: Diagnosis not present

## 2019-11-03 DIAGNOSIS — Z1159 Encounter for screening for other viral diseases: Secondary | ICD-10-CM | POA: Diagnosis not present

## 2019-11-03 DIAGNOSIS — R41841 Cognitive communication deficit: Secondary | ICD-10-CM | POA: Diagnosis not present

## 2019-11-03 DIAGNOSIS — R2681 Unsteadiness on feet: Secondary | ICD-10-CM | POA: Diagnosis not present

## 2019-11-05 DIAGNOSIS — R2689 Other abnormalities of gait and mobility: Secondary | ICD-10-CM | POA: Diagnosis not present

## 2019-11-05 DIAGNOSIS — R1312 Dysphagia, oropharyngeal phase: Secondary | ICD-10-CM | POA: Diagnosis not present

## 2019-11-05 DIAGNOSIS — R2681 Unsteadiness on feet: Secondary | ICD-10-CM | POA: Diagnosis not present

## 2019-11-05 DIAGNOSIS — R488 Other symbolic dysfunctions: Secondary | ICD-10-CM | POA: Diagnosis not present

## 2019-11-05 DIAGNOSIS — R41841 Cognitive communication deficit: Secondary | ICD-10-CM | POA: Diagnosis not present

## 2019-11-05 DIAGNOSIS — M6281 Muscle weakness (generalized): Secondary | ICD-10-CM | POA: Diagnosis not present

## 2019-11-08 ENCOUNTER — Ambulatory Visit: Payer: Medicare Other | Admitting: Neurology

## 2019-11-08 DIAGNOSIS — R1312 Dysphagia, oropharyngeal phase: Secondary | ICD-10-CM | POA: Diagnosis not present

## 2019-11-08 DIAGNOSIS — R2681 Unsteadiness on feet: Secondary | ICD-10-CM | POA: Diagnosis not present

## 2019-11-08 DIAGNOSIS — R2689 Other abnormalities of gait and mobility: Secondary | ICD-10-CM | POA: Diagnosis not present

## 2019-11-08 DIAGNOSIS — R41841 Cognitive communication deficit: Secondary | ICD-10-CM | POA: Diagnosis not present

## 2019-11-08 DIAGNOSIS — R488 Other symbolic dysfunctions: Secondary | ICD-10-CM | POA: Diagnosis not present

## 2019-11-08 DIAGNOSIS — M6281 Muscle weakness (generalized): Secondary | ICD-10-CM | POA: Diagnosis not present

## 2019-11-09 DIAGNOSIS — R488 Other symbolic dysfunctions: Secondary | ICD-10-CM | POA: Diagnosis not present

## 2019-11-09 DIAGNOSIS — M6281 Muscle weakness (generalized): Secondary | ICD-10-CM | POA: Diagnosis not present

## 2019-11-09 DIAGNOSIS — R1312 Dysphagia, oropharyngeal phase: Secondary | ICD-10-CM | POA: Diagnosis not present

## 2019-11-09 DIAGNOSIS — R2681 Unsteadiness on feet: Secondary | ICD-10-CM | POA: Diagnosis not present

## 2019-11-09 DIAGNOSIS — R2689 Other abnormalities of gait and mobility: Secondary | ICD-10-CM | POA: Diagnosis not present

## 2019-11-09 DIAGNOSIS — R41841 Cognitive communication deficit: Secondary | ICD-10-CM | POA: Diagnosis not present

## 2019-11-10 DIAGNOSIS — R2689 Other abnormalities of gait and mobility: Secondary | ICD-10-CM | POA: Diagnosis not present

## 2019-11-10 DIAGNOSIS — Z20828 Contact with and (suspected) exposure to other viral communicable diseases: Secondary | ICD-10-CM | POA: Diagnosis not present

## 2019-11-10 DIAGNOSIS — R41841 Cognitive communication deficit: Secondary | ICD-10-CM | POA: Diagnosis not present

## 2019-11-10 DIAGNOSIS — R2681 Unsteadiness on feet: Secondary | ICD-10-CM | POA: Diagnosis not present

## 2019-11-10 DIAGNOSIS — M6281 Muscle weakness (generalized): Secondary | ICD-10-CM | POA: Diagnosis not present

## 2019-11-10 DIAGNOSIS — R488 Other symbolic dysfunctions: Secondary | ICD-10-CM | POA: Diagnosis not present

## 2019-11-10 DIAGNOSIS — Z1159 Encounter for screening for other viral diseases: Secondary | ICD-10-CM | POA: Diagnosis not present

## 2019-11-10 DIAGNOSIS — R1312 Dysphagia, oropharyngeal phase: Secondary | ICD-10-CM | POA: Diagnosis not present

## 2019-11-10 DIAGNOSIS — Z23 Encounter for immunization: Secondary | ICD-10-CM | POA: Diagnosis not present

## 2019-11-11 DIAGNOSIS — R1312 Dysphagia, oropharyngeal phase: Secondary | ICD-10-CM | POA: Diagnosis not present

## 2019-11-11 DIAGNOSIS — R2689 Other abnormalities of gait and mobility: Secondary | ICD-10-CM | POA: Diagnosis not present

## 2019-11-11 DIAGNOSIS — M6281 Muscle weakness (generalized): Secondary | ICD-10-CM | POA: Diagnosis not present

## 2019-11-11 DIAGNOSIS — R488 Other symbolic dysfunctions: Secondary | ICD-10-CM | POA: Diagnosis not present

## 2019-11-11 DIAGNOSIS — R2681 Unsteadiness on feet: Secondary | ICD-10-CM | POA: Diagnosis not present

## 2019-11-11 DIAGNOSIS — R41841 Cognitive communication deficit: Secondary | ICD-10-CM | POA: Diagnosis not present

## 2019-11-12 DIAGNOSIS — R41841 Cognitive communication deficit: Secondary | ICD-10-CM | POA: Diagnosis not present

## 2019-11-12 DIAGNOSIS — R488 Other symbolic dysfunctions: Secondary | ICD-10-CM | POA: Diagnosis not present

## 2019-11-12 DIAGNOSIS — M6281 Muscle weakness (generalized): Secondary | ICD-10-CM | POA: Diagnosis not present

## 2019-11-12 DIAGNOSIS — R1312 Dysphagia, oropharyngeal phase: Secondary | ICD-10-CM | POA: Diagnosis not present

## 2019-11-12 DIAGNOSIS — R2681 Unsteadiness on feet: Secondary | ICD-10-CM | POA: Diagnosis not present

## 2019-11-12 DIAGNOSIS — R2689 Other abnormalities of gait and mobility: Secondary | ICD-10-CM | POA: Diagnosis not present

## 2019-11-15 DIAGNOSIS — M6281 Muscle weakness (generalized): Secondary | ICD-10-CM | POA: Diagnosis not present

## 2019-11-15 DIAGNOSIS — R2681 Unsteadiness on feet: Secondary | ICD-10-CM | POA: Diagnosis not present

## 2019-11-15 DIAGNOSIS — R2689 Other abnormalities of gait and mobility: Secondary | ICD-10-CM | POA: Diagnosis not present

## 2019-11-15 DIAGNOSIS — R1312 Dysphagia, oropharyngeal phase: Secondary | ICD-10-CM | POA: Diagnosis not present

## 2019-11-15 DIAGNOSIS — R488 Other symbolic dysfunctions: Secondary | ICD-10-CM | POA: Diagnosis not present

## 2019-11-15 DIAGNOSIS — R41841 Cognitive communication deficit: Secondary | ICD-10-CM | POA: Diagnosis not present

## 2019-11-16 DIAGNOSIS — R2689 Other abnormalities of gait and mobility: Secondary | ICD-10-CM | POA: Diagnosis not present

## 2019-11-16 DIAGNOSIS — R488 Other symbolic dysfunctions: Secondary | ICD-10-CM | POA: Diagnosis not present

## 2019-11-16 DIAGNOSIS — R2681 Unsteadiness on feet: Secondary | ICD-10-CM | POA: Diagnosis not present

## 2019-11-16 DIAGNOSIS — R41841 Cognitive communication deficit: Secondary | ICD-10-CM | POA: Diagnosis not present

## 2019-11-16 DIAGNOSIS — Z20828 Contact with and (suspected) exposure to other viral communicable diseases: Secondary | ICD-10-CM | POA: Diagnosis not present

## 2019-11-16 DIAGNOSIS — Z1159 Encounter for screening for other viral diseases: Secondary | ICD-10-CM | POA: Diagnosis not present

## 2019-11-16 DIAGNOSIS — R1312 Dysphagia, oropharyngeal phase: Secondary | ICD-10-CM | POA: Diagnosis not present

## 2019-11-16 DIAGNOSIS — M6281 Muscle weakness (generalized): Secondary | ICD-10-CM | POA: Diagnosis not present

## 2019-11-17 DIAGNOSIS — R1312 Dysphagia, oropharyngeal phase: Secondary | ICD-10-CM | POA: Diagnosis not present

## 2019-11-17 DIAGNOSIS — I4891 Unspecified atrial fibrillation: Secondary | ICD-10-CM | POA: Diagnosis not present

## 2019-11-17 DIAGNOSIS — R2689 Other abnormalities of gait and mobility: Secondary | ICD-10-CM | POA: Diagnosis not present

## 2019-11-17 DIAGNOSIS — R2681 Unsteadiness on feet: Secondary | ICD-10-CM | POA: Diagnosis not present

## 2019-11-17 DIAGNOSIS — R41841 Cognitive communication deficit: Secondary | ICD-10-CM | POA: Diagnosis not present

## 2019-11-17 DIAGNOSIS — I10 Essential (primary) hypertension: Secondary | ICD-10-CM | POA: Diagnosis not present

## 2019-11-17 DIAGNOSIS — R739 Hyperglycemia, unspecified: Secondary | ICD-10-CM | POA: Diagnosis not present

## 2019-11-17 DIAGNOSIS — R799 Abnormal finding of blood chemistry, unspecified: Secondary | ICD-10-CM | POA: Diagnosis not present

## 2019-11-17 DIAGNOSIS — M6281 Muscle weakness (generalized): Secondary | ICD-10-CM | POA: Diagnosis not present

## 2019-11-17 DIAGNOSIS — R488 Other symbolic dysfunctions: Secondary | ICD-10-CM | POA: Diagnosis not present

## 2019-11-19 DIAGNOSIS — Z1159 Encounter for screening for other viral diseases: Secondary | ICD-10-CM | POA: Diagnosis not present

## 2019-11-19 DIAGNOSIS — Z20828 Contact with and (suspected) exposure to other viral communicable diseases: Secondary | ICD-10-CM | POA: Diagnosis not present

## 2019-11-19 DIAGNOSIS — R1312 Dysphagia, oropharyngeal phase: Secondary | ICD-10-CM | POA: Diagnosis not present

## 2019-11-19 DIAGNOSIS — R41841 Cognitive communication deficit: Secondary | ICD-10-CM | POA: Diagnosis not present

## 2019-11-19 DIAGNOSIS — R2681 Unsteadiness on feet: Secondary | ICD-10-CM | POA: Diagnosis not present

## 2019-11-19 DIAGNOSIS — M6281 Muscle weakness (generalized): Secondary | ICD-10-CM | POA: Diagnosis not present

## 2019-11-19 DIAGNOSIS — R2689 Other abnormalities of gait and mobility: Secondary | ICD-10-CM | POA: Diagnosis not present

## 2019-11-19 DIAGNOSIS — R488 Other symbolic dysfunctions: Secondary | ICD-10-CM | POA: Diagnosis not present

## 2019-11-22 DIAGNOSIS — R1312 Dysphagia, oropharyngeal phase: Secondary | ICD-10-CM | POA: Diagnosis not present

## 2019-11-22 DIAGNOSIS — R41841 Cognitive communication deficit: Secondary | ICD-10-CM | POA: Diagnosis not present

## 2019-11-22 DIAGNOSIS — M6281 Muscle weakness (generalized): Secondary | ICD-10-CM | POA: Diagnosis not present

## 2019-11-22 DIAGNOSIS — R488 Other symbolic dysfunctions: Secondary | ICD-10-CM | POA: Diagnosis not present

## 2019-11-22 DIAGNOSIS — R2689 Other abnormalities of gait and mobility: Secondary | ICD-10-CM | POA: Diagnosis not present

## 2019-11-22 DIAGNOSIS — R2681 Unsteadiness on feet: Secondary | ICD-10-CM | POA: Diagnosis not present

## 2019-11-23 DIAGNOSIS — R2689 Other abnormalities of gait and mobility: Secondary | ICD-10-CM | POA: Diagnosis not present

## 2019-11-23 DIAGNOSIS — R2681 Unsteadiness on feet: Secondary | ICD-10-CM | POA: Diagnosis not present

## 2019-11-23 DIAGNOSIS — R1312 Dysphagia, oropharyngeal phase: Secondary | ICD-10-CM | POA: Diagnosis not present

## 2019-11-23 DIAGNOSIS — R488 Other symbolic dysfunctions: Secondary | ICD-10-CM | POA: Diagnosis not present

## 2019-11-23 DIAGNOSIS — Z1159 Encounter for screening for other viral diseases: Secondary | ICD-10-CM | POA: Diagnosis not present

## 2019-11-23 DIAGNOSIS — M6281 Muscle weakness (generalized): Secondary | ICD-10-CM | POA: Diagnosis not present

## 2019-11-23 DIAGNOSIS — Z20828 Contact with and (suspected) exposure to other viral communicable diseases: Secondary | ICD-10-CM | POA: Diagnosis not present

## 2019-11-23 DIAGNOSIS — R41841 Cognitive communication deficit: Secondary | ICD-10-CM | POA: Diagnosis not present

## 2019-11-24 DIAGNOSIS — R2689 Other abnormalities of gait and mobility: Secondary | ICD-10-CM | POA: Diagnosis not present

## 2019-11-24 DIAGNOSIS — R2681 Unsteadiness on feet: Secondary | ICD-10-CM | POA: Diagnosis not present

## 2019-11-24 DIAGNOSIS — M6281 Muscle weakness (generalized): Secondary | ICD-10-CM | POA: Diagnosis not present

## 2019-11-24 DIAGNOSIS — I10 Essential (primary) hypertension: Secondary | ICD-10-CM | POA: Diagnosis not present

## 2019-11-24 DIAGNOSIS — R488 Other symbolic dysfunctions: Secondary | ICD-10-CM | POA: Diagnosis not present

## 2019-11-24 DIAGNOSIS — F329 Major depressive disorder, single episode, unspecified: Secondary | ICD-10-CM | POA: Diagnosis not present

## 2019-11-24 DIAGNOSIS — R1312 Dysphagia, oropharyngeal phase: Secondary | ICD-10-CM | POA: Diagnosis not present

## 2019-11-24 DIAGNOSIS — R41841 Cognitive communication deficit: Secondary | ICD-10-CM | POA: Diagnosis not present

## 2019-11-24 DIAGNOSIS — K219 Gastro-esophageal reflux disease without esophagitis: Secondary | ICD-10-CM | POA: Diagnosis not present

## 2019-11-25 DIAGNOSIS — R488 Other symbolic dysfunctions: Secondary | ICD-10-CM | POA: Diagnosis not present

## 2019-11-25 DIAGNOSIS — R2681 Unsteadiness on feet: Secondary | ICD-10-CM | POA: Diagnosis not present

## 2019-11-25 DIAGNOSIS — R2689 Other abnormalities of gait and mobility: Secondary | ICD-10-CM | POA: Diagnosis not present

## 2019-11-25 DIAGNOSIS — R41841 Cognitive communication deficit: Secondary | ICD-10-CM | POA: Diagnosis not present

## 2019-11-25 DIAGNOSIS — R1312 Dysphagia, oropharyngeal phase: Secondary | ICD-10-CM | POA: Diagnosis not present

## 2019-11-25 DIAGNOSIS — M6281 Muscle weakness (generalized): Secondary | ICD-10-CM | POA: Diagnosis not present

## 2019-11-26 DIAGNOSIS — Z1159 Encounter for screening for other viral diseases: Secondary | ICD-10-CM | POA: Diagnosis not present

## 2019-11-26 DIAGNOSIS — Z20828 Contact with and (suspected) exposure to other viral communicable diseases: Secondary | ICD-10-CM | POA: Diagnosis not present

## 2019-11-29 DIAGNOSIS — R1312 Dysphagia, oropharyngeal phase: Secondary | ICD-10-CM | POA: Diagnosis not present

## 2019-11-29 DIAGNOSIS — R488 Other symbolic dysfunctions: Secondary | ICD-10-CM | POA: Diagnosis not present

## 2019-11-29 DIAGNOSIS — M6281 Muscle weakness (generalized): Secondary | ICD-10-CM | POA: Diagnosis not present

## 2019-11-29 DIAGNOSIS — R41841 Cognitive communication deficit: Secondary | ICD-10-CM | POA: Diagnosis not present

## 2019-11-30 DIAGNOSIS — Z1159 Encounter for screening for other viral diseases: Secondary | ICD-10-CM | POA: Diagnosis not present

## 2019-11-30 DIAGNOSIS — Z20828 Contact with and (suspected) exposure to other viral communicable diseases: Secondary | ICD-10-CM | POA: Diagnosis not present

## 2019-12-01 DIAGNOSIS — W19XXXA Unspecified fall, initial encounter: Secondary | ICD-10-CM | POA: Diagnosis not present

## 2019-12-01 DIAGNOSIS — K219 Gastro-esophageal reflux disease without esophagitis: Secondary | ICD-10-CM | POA: Diagnosis not present

## 2019-12-01 DIAGNOSIS — R2681 Unsteadiness on feet: Secondary | ICD-10-CM | POA: Diagnosis not present

## 2019-12-01 DIAGNOSIS — M25512 Pain in left shoulder: Secondary | ICD-10-CM | POA: Diagnosis not present

## 2019-12-01 DIAGNOSIS — S01302S Unspecified open wound of left ear, sequela: Secondary | ICD-10-CM | POA: Diagnosis not present

## 2019-12-02 DIAGNOSIS — R488 Other symbolic dysfunctions: Secondary | ICD-10-CM | POA: Diagnosis not present

## 2019-12-02 DIAGNOSIS — M25512 Pain in left shoulder: Secondary | ICD-10-CM | POA: Diagnosis not present

## 2019-12-02 DIAGNOSIS — R41841 Cognitive communication deficit: Secondary | ICD-10-CM | POA: Diagnosis not present

## 2019-12-02 DIAGNOSIS — M6281 Muscle weakness (generalized): Secondary | ICD-10-CM | POA: Diagnosis not present

## 2019-12-02 DIAGNOSIS — R1312 Dysphagia, oropharyngeal phase: Secondary | ICD-10-CM | POA: Diagnosis not present

## 2019-12-03 DIAGNOSIS — M6281 Muscle weakness (generalized): Secondary | ICD-10-CM | POA: Diagnosis not present

## 2019-12-03 DIAGNOSIS — R41841 Cognitive communication deficit: Secondary | ICD-10-CM | POA: Diagnosis not present

## 2019-12-03 DIAGNOSIS — R488 Other symbolic dysfunctions: Secondary | ICD-10-CM | POA: Diagnosis not present

## 2019-12-03 DIAGNOSIS — R1312 Dysphagia, oropharyngeal phase: Secondary | ICD-10-CM | POA: Diagnosis not present

## 2019-12-03 DIAGNOSIS — Z1159 Encounter for screening for other viral diseases: Secondary | ICD-10-CM | POA: Diagnosis not present

## 2019-12-03 DIAGNOSIS — Z20828 Contact with and (suspected) exposure to other viral communicable diseases: Secondary | ICD-10-CM | POA: Diagnosis not present

## 2019-12-07 DIAGNOSIS — Z20828 Contact with and (suspected) exposure to other viral communicable diseases: Secondary | ICD-10-CM | POA: Diagnosis not present

## 2019-12-07 DIAGNOSIS — R488 Other symbolic dysfunctions: Secondary | ICD-10-CM | POA: Diagnosis not present

## 2019-12-07 DIAGNOSIS — R41841 Cognitive communication deficit: Secondary | ICD-10-CM | POA: Diagnosis not present

## 2019-12-07 DIAGNOSIS — Z1159 Encounter for screening for other viral diseases: Secondary | ICD-10-CM | POA: Diagnosis not present

## 2019-12-07 DIAGNOSIS — M6281 Muscle weakness (generalized): Secondary | ICD-10-CM | POA: Diagnosis not present

## 2019-12-07 DIAGNOSIS — R1312 Dysphagia, oropharyngeal phase: Secondary | ICD-10-CM | POA: Diagnosis not present

## 2019-12-08 DIAGNOSIS — R488 Other symbolic dysfunctions: Secondary | ICD-10-CM | POA: Diagnosis not present

## 2019-12-08 DIAGNOSIS — R1312 Dysphagia, oropharyngeal phase: Secondary | ICD-10-CM | POA: Diagnosis not present

## 2019-12-08 DIAGNOSIS — R41841 Cognitive communication deficit: Secondary | ICD-10-CM | POA: Diagnosis not present

## 2019-12-08 DIAGNOSIS — M6281 Muscle weakness (generalized): Secondary | ICD-10-CM | POA: Diagnosis not present

## 2019-12-09 DIAGNOSIS — M6281 Muscle weakness (generalized): Secondary | ICD-10-CM | POA: Diagnosis not present

## 2019-12-09 DIAGNOSIS — R1312 Dysphagia, oropharyngeal phase: Secondary | ICD-10-CM | POA: Diagnosis not present

## 2019-12-09 DIAGNOSIS — R488 Other symbolic dysfunctions: Secondary | ICD-10-CM | POA: Diagnosis not present

## 2019-12-09 DIAGNOSIS — R41841 Cognitive communication deficit: Secondary | ICD-10-CM | POA: Diagnosis not present

## 2019-12-10 DIAGNOSIS — M6281 Muscle weakness (generalized): Secondary | ICD-10-CM | POA: Diagnosis not present

## 2019-12-10 DIAGNOSIS — R1312 Dysphagia, oropharyngeal phase: Secondary | ICD-10-CM | POA: Diagnosis not present

## 2019-12-10 DIAGNOSIS — R488 Other symbolic dysfunctions: Secondary | ICD-10-CM | POA: Diagnosis not present

## 2019-12-10 DIAGNOSIS — R41841 Cognitive communication deficit: Secondary | ICD-10-CM | POA: Diagnosis not present

## 2019-12-11 DIAGNOSIS — M6281 Muscle weakness (generalized): Secondary | ICD-10-CM | POA: Diagnosis not present

## 2019-12-11 DIAGNOSIS — R41841 Cognitive communication deficit: Secondary | ICD-10-CM | POA: Diagnosis not present

## 2019-12-11 DIAGNOSIS — R1312 Dysphagia, oropharyngeal phase: Secondary | ICD-10-CM | POA: Diagnosis not present

## 2019-12-11 DIAGNOSIS — R488 Other symbolic dysfunctions: Secondary | ICD-10-CM | POA: Diagnosis not present

## 2019-12-13 DIAGNOSIS — M6281 Muscle weakness (generalized): Secondary | ICD-10-CM | POA: Diagnosis not present

## 2019-12-13 DIAGNOSIS — R41841 Cognitive communication deficit: Secondary | ICD-10-CM | POA: Diagnosis not present

## 2019-12-13 DIAGNOSIS — R1312 Dysphagia, oropharyngeal phase: Secondary | ICD-10-CM | POA: Diagnosis not present

## 2019-12-13 DIAGNOSIS — R488 Other symbolic dysfunctions: Secondary | ICD-10-CM | POA: Diagnosis not present

## 2019-12-14 DIAGNOSIS — Z20828 Contact with and (suspected) exposure to other viral communicable diseases: Secondary | ICD-10-CM | POA: Diagnosis not present

## 2019-12-14 DIAGNOSIS — Z1159 Encounter for screening for other viral diseases: Secondary | ICD-10-CM | POA: Diagnosis not present

## 2019-12-17 DIAGNOSIS — R488 Other symbolic dysfunctions: Secondary | ICD-10-CM | POA: Diagnosis not present

## 2019-12-17 DIAGNOSIS — R1312 Dysphagia, oropharyngeal phase: Secondary | ICD-10-CM | POA: Diagnosis not present

## 2019-12-17 DIAGNOSIS — M6281 Muscle weakness (generalized): Secondary | ICD-10-CM | POA: Diagnosis not present

## 2019-12-17 DIAGNOSIS — R41841 Cognitive communication deficit: Secondary | ICD-10-CM | POA: Diagnosis not present

## 2019-12-21 DIAGNOSIS — Z20828 Contact with and (suspected) exposure to other viral communicable diseases: Secondary | ICD-10-CM | POA: Diagnosis not present

## 2019-12-21 DIAGNOSIS — Z1159 Encounter for screening for other viral diseases: Secondary | ICD-10-CM | POA: Diagnosis not present

## 2019-12-22 DIAGNOSIS — R1312 Dysphagia, oropharyngeal phase: Secondary | ICD-10-CM | POA: Diagnosis not present

## 2019-12-22 DIAGNOSIS — R488 Other symbolic dysfunctions: Secondary | ICD-10-CM | POA: Diagnosis not present

## 2019-12-22 DIAGNOSIS — M6281 Muscle weakness (generalized): Secondary | ICD-10-CM | POA: Diagnosis not present

## 2019-12-22 DIAGNOSIS — R41841 Cognitive communication deficit: Secondary | ICD-10-CM | POA: Diagnosis not present

## 2019-12-24 DIAGNOSIS — R488 Other symbolic dysfunctions: Secondary | ICD-10-CM | POA: Diagnosis not present

## 2019-12-24 DIAGNOSIS — R41841 Cognitive communication deficit: Secondary | ICD-10-CM | POA: Diagnosis not present

## 2019-12-24 DIAGNOSIS — R1312 Dysphagia, oropharyngeal phase: Secondary | ICD-10-CM | POA: Diagnosis not present

## 2019-12-24 DIAGNOSIS — M6281 Muscle weakness (generalized): Secondary | ICD-10-CM | POA: Diagnosis not present

## 2019-12-28 DIAGNOSIS — Z20828 Contact with and (suspected) exposure to other viral communicable diseases: Secondary | ICD-10-CM | POA: Diagnosis not present

## 2019-12-28 DIAGNOSIS — Z1159 Encounter for screening for other viral diseases: Secondary | ICD-10-CM | POA: Diagnosis not present

## 2019-12-28 DIAGNOSIS — R488 Other symbolic dysfunctions: Secondary | ICD-10-CM | POA: Diagnosis not present

## 2019-12-28 DIAGNOSIS — M6281 Muscle weakness (generalized): Secondary | ICD-10-CM | POA: Diagnosis not present

## 2019-12-29 DIAGNOSIS — E559 Vitamin D deficiency, unspecified: Secondary | ICD-10-CM | POA: Diagnosis not present

## 2019-12-29 DIAGNOSIS — R2681 Unsteadiness on feet: Secondary | ICD-10-CM | POA: Diagnosis not present

## 2019-12-29 DIAGNOSIS — Z79899 Other long term (current) drug therapy: Secondary | ICD-10-CM | POA: Diagnosis not present

## 2019-12-29 DIAGNOSIS — R296 Repeated falls: Secondary | ICD-10-CM | POA: Diagnosis not present

## 2019-12-29 DIAGNOSIS — M25511 Pain in right shoulder: Secondary | ICD-10-CM | POA: Diagnosis not present

## 2019-12-29 DIAGNOSIS — F0390 Unspecified dementia without behavioral disturbance: Secondary | ICD-10-CM | POA: Diagnosis not present

## 2019-12-30 DIAGNOSIS — R488 Other symbolic dysfunctions: Secondary | ICD-10-CM | POA: Diagnosis not present

## 2019-12-30 DIAGNOSIS — M6281 Muscle weakness (generalized): Secondary | ICD-10-CM | POA: Diagnosis not present

## 2019-12-30 DIAGNOSIS — E538 Deficiency of other specified B group vitamins: Secondary | ICD-10-CM | POA: Diagnosis not present

## 2019-12-31 DIAGNOSIS — M25511 Pain in right shoulder: Secondary | ICD-10-CM | POA: Diagnosis not present

## 2020-01-03 DIAGNOSIS — M6281 Muscle weakness (generalized): Secondary | ICD-10-CM | POA: Diagnosis not present

## 2020-01-03 DIAGNOSIS — R488 Other symbolic dysfunctions: Secondary | ICD-10-CM | POA: Diagnosis not present

## 2020-01-03 DIAGNOSIS — Z23 Encounter for immunization: Secondary | ICD-10-CM | POA: Diagnosis not present

## 2020-01-05 DIAGNOSIS — M25519 Pain in unspecified shoulder: Secondary | ICD-10-CM | POA: Diagnosis not present

## 2020-01-05 DIAGNOSIS — I10 Essential (primary) hypertension: Secondary | ICD-10-CM | POA: Diagnosis not present

## 2020-01-05 DIAGNOSIS — E538 Deficiency of other specified B group vitamins: Secondary | ICD-10-CM | POA: Diagnosis not present

## 2020-01-05 DIAGNOSIS — F329 Major depressive disorder, single episode, unspecified: Secondary | ICD-10-CM | POA: Diagnosis not present

## 2020-01-07 DIAGNOSIS — M6281 Muscle weakness (generalized): Secondary | ICD-10-CM | POA: Diagnosis not present

## 2020-01-07 DIAGNOSIS — R488 Other symbolic dysfunctions: Secondary | ICD-10-CM | POA: Diagnosis not present

## 2020-01-10 DIAGNOSIS — M79675 Pain in left toe(s): Secondary | ICD-10-CM | POA: Diagnosis not present

## 2020-01-10 DIAGNOSIS — B351 Tinea unguium: Secondary | ICD-10-CM | POA: Diagnosis not present

## 2020-01-10 DIAGNOSIS — M79674 Pain in right toe(s): Secondary | ICD-10-CM | POA: Diagnosis not present

## 2020-01-11 DIAGNOSIS — Z1159 Encounter for screening for other viral diseases: Secondary | ICD-10-CM | POA: Diagnosis not present

## 2020-01-11 DIAGNOSIS — M6281 Muscle weakness (generalized): Secondary | ICD-10-CM | POA: Diagnosis not present

## 2020-01-11 DIAGNOSIS — R488 Other symbolic dysfunctions: Secondary | ICD-10-CM | POA: Diagnosis not present

## 2020-01-11 DIAGNOSIS — Z20828 Contact with and (suspected) exposure to other viral communicable diseases: Secondary | ICD-10-CM | POA: Diagnosis not present

## 2020-01-12 DIAGNOSIS — M6281 Muscle weakness (generalized): Secondary | ICD-10-CM | POA: Diagnosis not present

## 2020-01-12 DIAGNOSIS — R488 Other symbolic dysfunctions: Secondary | ICD-10-CM | POA: Diagnosis not present

## 2020-01-13 DIAGNOSIS — R488 Other symbolic dysfunctions: Secondary | ICD-10-CM | POA: Diagnosis not present

## 2020-01-13 DIAGNOSIS — M6281 Muscle weakness (generalized): Secondary | ICD-10-CM | POA: Diagnosis not present

## 2020-01-17 DIAGNOSIS — M6281 Muscle weakness (generalized): Secondary | ICD-10-CM | POA: Diagnosis not present

## 2020-01-17 DIAGNOSIS — R488 Other symbolic dysfunctions: Secondary | ICD-10-CM | POA: Diagnosis not present

## 2020-01-18 DIAGNOSIS — Z1159 Encounter for screening for other viral diseases: Secondary | ICD-10-CM | POA: Diagnosis not present

## 2020-01-18 DIAGNOSIS — Z20828 Contact with and (suspected) exposure to other viral communicable diseases: Secondary | ICD-10-CM | POA: Diagnosis not present

## 2020-01-19 DIAGNOSIS — R488 Other symbolic dysfunctions: Secondary | ICD-10-CM | POA: Diagnosis not present

## 2020-01-19 DIAGNOSIS — M6281 Muscle weakness (generalized): Secondary | ICD-10-CM | POA: Diagnosis not present

## 2020-01-23 DIAGNOSIS — R488 Other symbolic dysfunctions: Secondary | ICD-10-CM | POA: Diagnosis not present

## 2020-01-23 DIAGNOSIS — M6281 Muscle weakness (generalized): Secondary | ICD-10-CM | POA: Diagnosis not present

## 2020-01-24 DIAGNOSIS — M6281 Muscle weakness (generalized): Secondary | ICD-10-CM | POA: Diagnosis not present

## 2020-01-24 DIAGNOSIS — R488 Other symbolic dysfunctions: Secondary | ICD-10-CM | POA: Diagnosis not present

## 2020-01-25 DIAGNOSIS — R488 Other symbolic dysfunctions: Secondary | ICD-10-CM | POA: Diagnosis not present

## 2020-01-25 DIAGNOSIS — M6281 Muscle weakness (generalized): Secondary | ICD-10-CM | POA: Diagnosis not present

## 2020-02-01 DIAGNOSIS — Z20828 Contact with and (suspected) exposure to other viral communicable diseases: Secondary | ICD-10-CM | POA: Diagnosis not present

## 2020-02-01 DIAGNOSIS — Z1159 Encounter for screening for other viral diseases: Secondary | ICD-10-CM | POA: Diagnosis not present

## 2020-02-02 DIAGNOSIS — K219 Gastro-esophageal reflux disease without esophagitis: Secondary | ICD-10-CM | POA: Diagnosis not present

## 2020-02-02 DIAGNOSIS — F5109 Other insomnia not due to a substance or known physiological condition: Secondary | ICD-10-CM | POA: Diagnosis not present

## 2020-02-02 DIAGNOSIS — Z79899 Other long term (current) drug therapy: Secondary | ICD-10-CM | POA: Diagnosis not present

## 2020-02-02 DIAGNOSIS — S51012A Laceration without foreign body of left elbow, initial encounter: Secondary | ICD-10-CM | POA: Diagnosis not present

## 2020-02-04 DIAGNOSIS — Z20828 Contact with and (suspected) exposure to other viral communicable diseases: Secondary | ICD-10-CM | POA: Diagnosis not present

## 2020-02-04 DIAGNOSIS — Z1159 Encounter for screening for other viral diseases: Secondary | ICD-10-CM | POA: Diagnosis not present

## 2020-02-08 DIAGNOSIS — Z20828 Contact with and (suspected) exposure to other viral communicable diseases: Secondary | ICD-10-CM | POA: Diagnosis not present

## 2020-02-08 DIAGNOSIS — Z1159 Encounter for screening for other viral diseases: Secondary | ICD-10-CM | POA: Diagnosis not present

## 2020-02-11 DIAGNOSIS — Z1159 Encounter for screening for other viral diseases: Secondary | ICD-10-CM | POA: Diagnosis not present

## 2020-02-11 DIAGNOSIS — Z20828 Contact with and (suspected) exposure to other viral communicable diseases: Secondary | ICD-10-CM | POA: Diagnosis not present

## 2020-02-15 DIAGNOSIS — Z20828 Contact with and (suspected) exposure to other viral communicable diseases: Secondary | ICD-10-CM | POA: Diagnosis not present

## 2020-02-15 DIAGNOSIS — Z1159 Encounter for screening for other viral diseases: Secondary | ICD-10-CM | POA: Diagnosis not present

## 2020-02-16 DIAGNOSIS — F0390 Unspecified dementia without behavioral disturbance: Secondary | ICD-10-CM | POA: Diagnosis not present

## 2020-02-16 DIAGNOSIS — D539 Nutritional anemia, unspecified: Secondary | ICD-10-CM | POA: Diagnosis not present

## 2020-02-16 DIAGNOSIS — E559 Vitamin D deficiency, unspecified: Secondary | ICD-10-CM | POA: Diagnosis not present

## 2020-02-17 DIAGNOSIS — Z20828 Contact with and (suspected) exposure to other viral communicable diseases: Secondary | ICD-10-CM | POA: Diagnosis not present

## 2020-02-17 DIAGNOSIS — Z1159 Encounter for screening for other viral diseases: Secondary | ICD-10-CM | POA: Diagnosis not present

## 2020-02-19 IMAGING — US US PELVIS LIMITED
1 series · 6 of 6 positions shown · non-contrast
Comparison: CT Abdomen and Pelvis 05/02/2016.

CLINICAL DATA: 77-year-old male with left inguinal bulge for 2
weeks.

EXAM:
LIMITED ULTRASOUND OF PELVIS
TECHNIQUE: Limited transabdominal ultrasound examination of the pelvis was
performed in the area of clinical concern.

[Series 1: us pelvis limited · 0.08mm/px · 6 acquisitions, 6 frames shown]
[im 1/6]
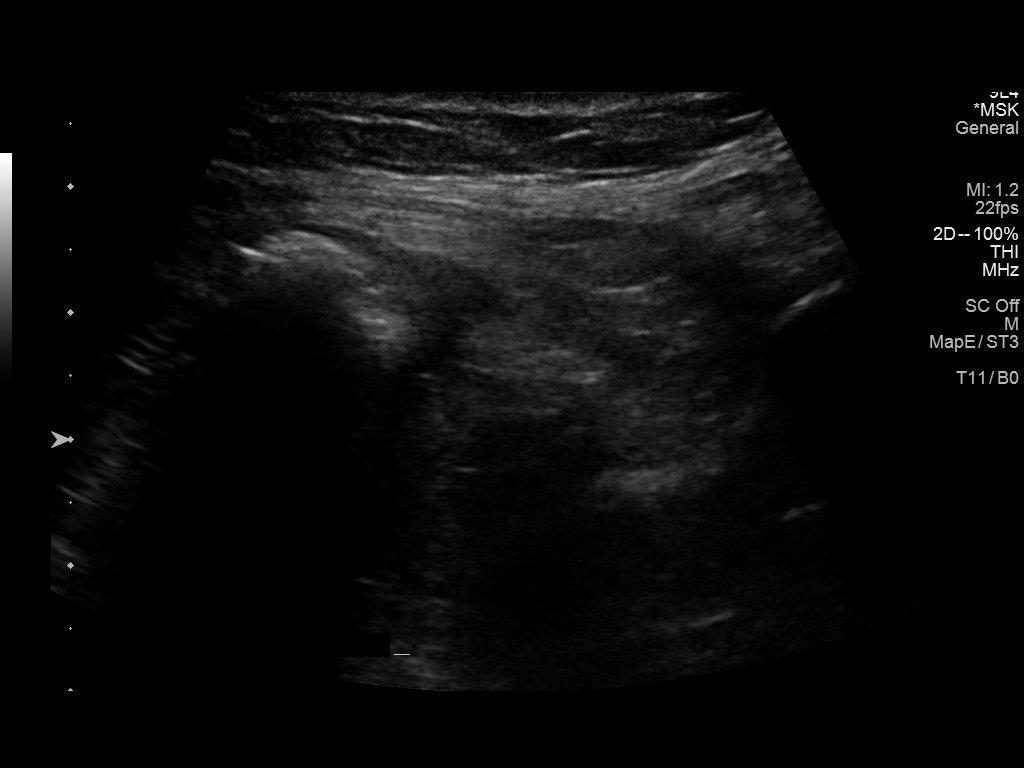
[im 2/6]
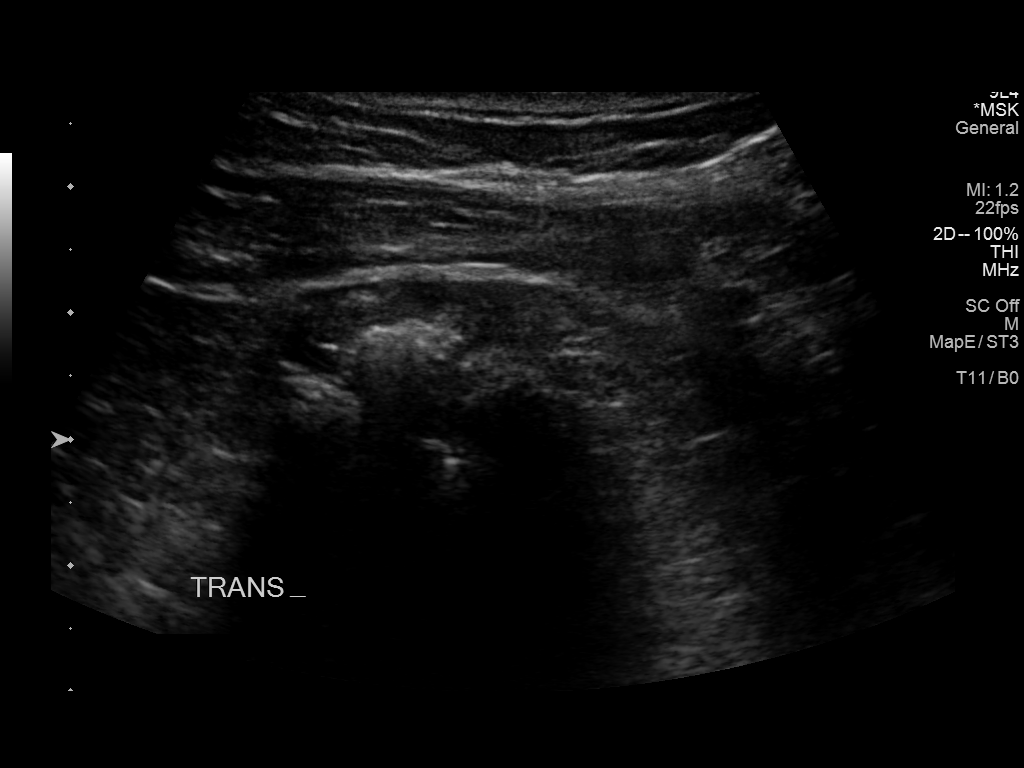
[im 3/6]
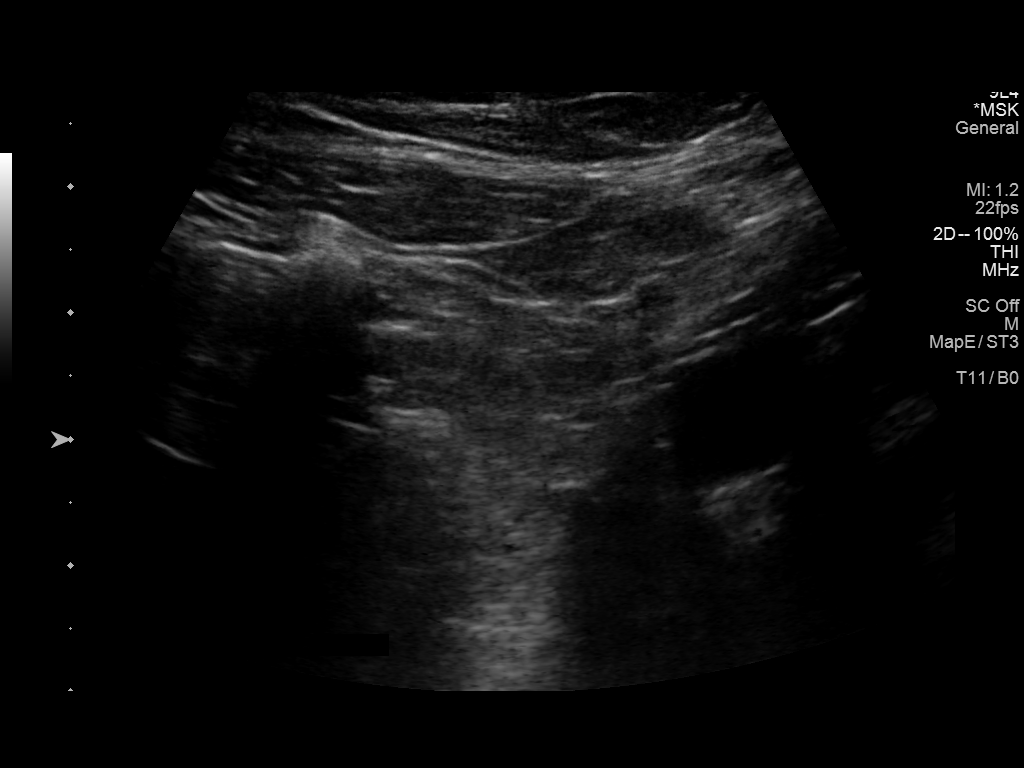
[im 4/6]
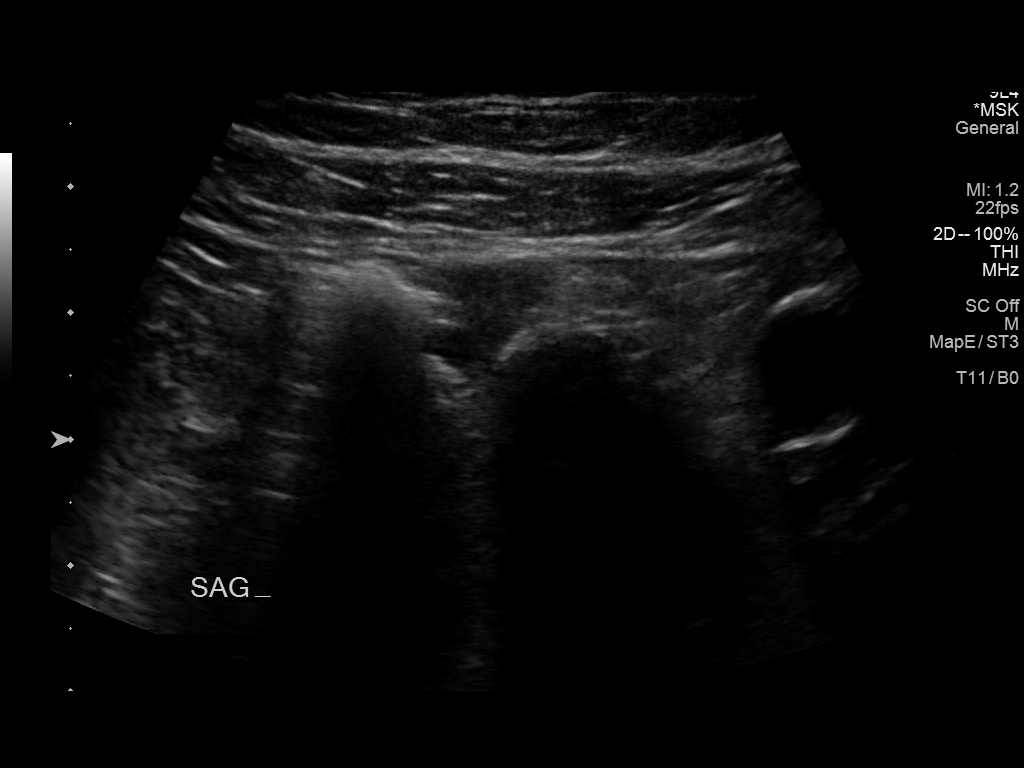
[im 5/6]
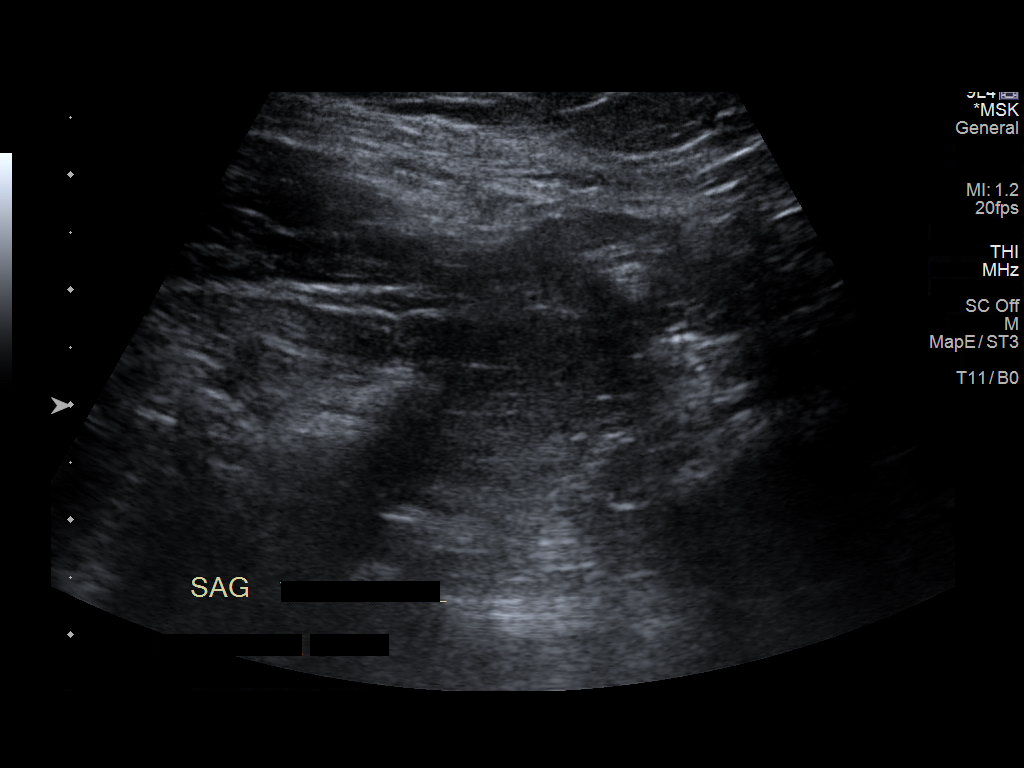
[im 6/6]
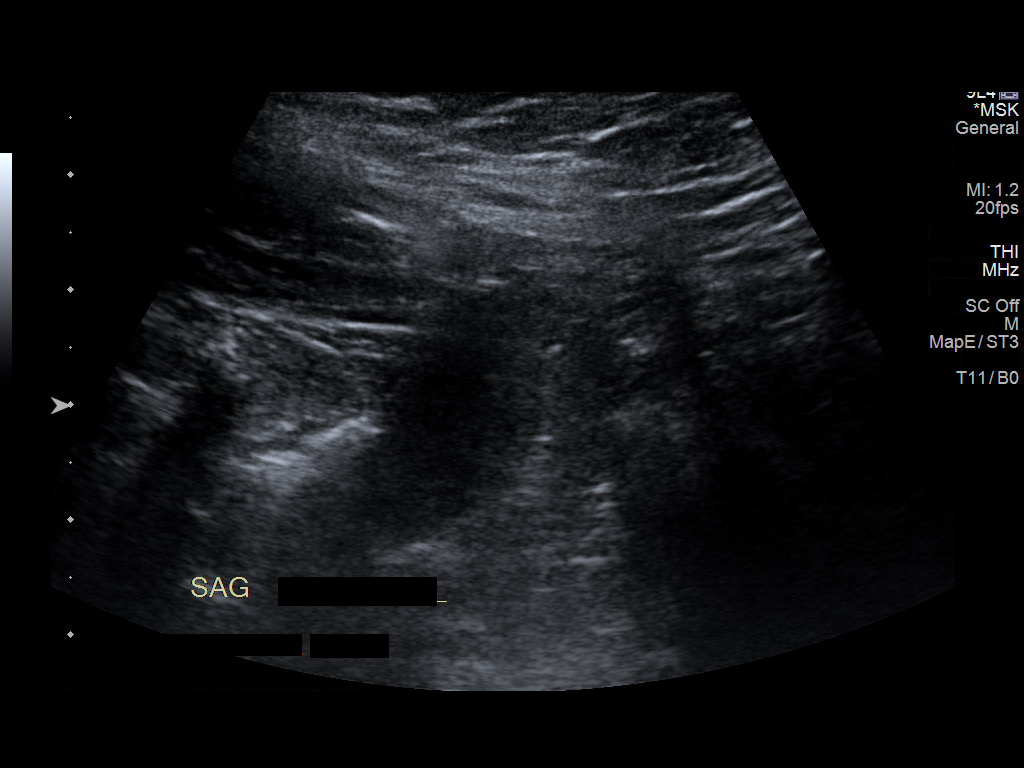

[6 of 6 positions shown; findings below may reference images not displayed]

FINDINGS: Grayscale images demonstrate a rounded soft tissue contour in the
area of clinical concern which translates during Valsalva and
marginally contains what appears to be nondilated bowel. See series
3 cine images.
IMPRESSION: Positive for inguinal hernia which appears to be partially or
marginally bowel containing.

## 2020-02-22 DIAGNOSIS — Z1159 Encounter for screening for other viral diseases: Secondary | ICD-10-CM | POA: Diagnosis not present

## 2020-02-22 DIAGNOSIS — Z20828 Contact with and (suspected) exposure to other viral communicable diseases: Secondary | ICD-10-CM | POA: Diagnosis not present

## 2020-02-25 DIAGNOSIS — Z1159 Encounter for screening for other viral diseases: Secondary | ICD-10-CM | POA: Diagnosis not present

## 2020-02-25 DIAGNOSIS — Z20828 Contact with and (suspected) exposure to other viral communicable diseases: Secondary | ICD-10-CM | POA: Diagnosis not present

## 2020-02-29 DIAGNOSIS — Z1159 Encounter for screening for other viral diseases: Secondary | ICD-10-CM | POA: Diagnosis not present

## 2020-02-29 DIAGNOSIS — Z20828 Contact with and (suspected) exposure to other viral communicable diseases: Secondary | ICD-10-CM | POA: Diagnosis not present

## 2020-03-01 DIAGNOSIS — W19XXXA Unspecified fall, initial encounter: Secondary | ICD-10-CM | POA: Diagnosis not present

## 2020-03-01 DIAGNOSIS — I4891 Unspecified atrial fibrillation: Secondary | ICD-10-CM | POA: Diagnosis not present

## 2020-03-01 DIAGNOSIS — I10 Essential (primary) hypertension: Secondary | ICD-10-CM | POA: Diagnosis not present

## 2020-03-03 DIAGNOSIS — Z20828 Contact with and (suspected) exposure to other viral communicable diseases: Secondary | ICD-10-CM | POA: Diagnosis not present

## 2020-03-03 DIAGNOSIS — Z1159 Encounter for screening for other viral diseases: Secondary | ICD-10-CM | POA: Diagnosis not present

## 2020-03-09 DIAGNOSIS — I10 Essential (primary) hypertension: Secondary | ICD-10-CM | POA: Diagnosis not present

## 2020-03-09 DIAGNOSIS — Z20828 Contact with and (suspected) exposure to other viral communicable diseases: Secondary | ICD-10-CM | POA: Diagnosis not present

## 2020-03-09 DIAGNOSIS — F0390 Unspecified dementia without behavioral disturbance: Secondary | ICD-10-CM | POA: Diagnosis not present

## 2020-03-14 DIAGNOSIS — Z20828 Contact with and (suspected) exposure to other viral communicable diseases: Secondary | ICD-10-CM | POA: Diagnosis not present

## 2020-03-16 DIAGNOSIS — Z20828 Contact with and (suspected) exposure to other viral communicable diseases: Secondary | ICD-10-CM | POA: Diagnosis not present

## 2020-03-20 DIAGNOSIS — Z20828 Contact with and (suspected) exposure to other viral communicable diseases: Secondary | ICD-10-CM | POA: Diagnosis not present

## 2020-03-21 DIAGNOSIS — M79675 Pain in left toe(s): Secondary | ICD-10-CM | POA: Diagnosis not present

## 2020-03-21 DIAGNOSIS — R2681 Unsteadiness on feet: Secondary | ICD-10-CM | POA: Diagnosis not present

## 2020-03-21 DIAGNOSIS — M79674 Pain in right toe(s): Secondary | ICD-10-CM | POA: Diagnosis not present

## 2020-03-21 DIAGNOSIS — R262 Difficulty in walking, not elsewhere classified: Secondary | ICD-10-CM | POA: Diagnosis not present

## 2020-03-21 DIAGNOSIS — B351 Tinea unguium: Secondary | ICD-10-CM | POA: Diagnosis not present

## 2020-03-21 DIAGNOSIS — M6281 Muscle weakness (generalized): Secondary | ICD-10-CM | POA: Diagnosis not present

## 2020-03-22 DIAGNOSIS — R262 Difficulty in walking, not elsewhere classified: Secondary | ICD-10-CM | POA: Diagnosis not present

## 2020-03-22 DIAGNOSIS — Z0000 Encounter for general adult medical examination without abnormal findings: Secondary | ICD-10-CM | POA: Diagnosis not present

## 2020-03-22 DIAGNOSIS — R2681 Unsteadiness on feet: Secondary | ICD-10-CM | POA: Diagnosis not present

## 2020-03-22 DIAGNOSIS — M6281 Muscle weakness (generalized): Secondary | ICD-10-CM | POA: Diagnosis not present

## 2020-03-23 DIAGNOSIS — Z20828 Contact with and (suspected) exposure to other viral communicable diseases: Secondary | ICD-10-CM | POA: Diagnosis not present

## 2020-03-25 DIAGNOSIS — R2681 Unsteadiness on feet: Secondary | ICD-10-CM | POA: Diagnosis not present

## 2020-03-25 DIAGNOSIS — R262 Difficulty in walking, not elsewhere classified: Secondary | ICD-10-CM | POA: Diagnosis not present

## 2020-03-25 DIAGNOSIS — M6281 Muscle weakness (generalized): Secondary | ICD-10-CM | POA: Diagnosis not present

## 2020-03-27 DIAGNOSIS — Z20828 Contact with and (suspected) exposure to other viral communicable diseases: Secondary | ICD-10-CM | POA: Diagnosis not present

## 2020-03-29 DIAGNOSIS — R262 Difficulty in walking, not elsewhere classified: Secondary | ICD-10-CM | POA: Diagnosis not present

## 2020-03-29 DIAGNOSIS — M6281 Muscle weakness (generalized): Secondary | ICD-10-CM | POA: Diagnosis not present

## 2020-03-29 DIAGNOSIS — R2681 Unsteadiness on feet: Secondary | ICD-10-CM | POA: Diagnosis not present

## 2020-03-30 DIAGNOSIS — R262 Difficulty in walking, not elsewhere classified: Secondary | ICD-10-CM | POA: Diagnosis not present

## 2020-03-30 DIAGNOSIS — M6281 Muscle weakness (generalized): Secondary | ICD-10-CM | POA: Diagnosis not present

## 2020-03-30 DIAGNOSIS — Z20828 Contact with and (suspected) exposure to other viral communicable diseases: Secondary | ICD-10-CM | POA: Diagnosis not present

## 2020-03-30 DIAGNOSIS — R2681 Unsteadiness on feet: Secondary | ICD-10-CM | POA: Diagnosis not present

## 2020-03-31 DIAGNOSIS — R262 Difficulty in walking, not elsewhere classified: Secondary | ICD-10-CM | POA: Diagnosis not present

## 2020-03-31 DIAGNOSIS — M6281 Muscle weakness (generalized): Secondary | ICD-10-CM | POA: Diagnosis not present

## 2020-03-31 DIAGNOSIS — R2681 Unsteadiness on feet: Secondary | ICD-10-CM | POA: Diagnosis not present

## 2020-04-03 DIAGNOSIS — Z20828 Contact with and (suspected) exposure to other viral communicable diseases: Secondary | ICD-10-CM | POA: Diagnosis not present

## 2020-04-03 DIAGNOSIS — R262 Difficulty in walking, not elsewhere classified: Secondary | ICD-10-CM | POA: Diagnosis not present

## 2020-04-03 DIAGNOSIS — R2681 Unsteadiness on feet: Secondary | ICD-10-CM | POA: Diagnosis not present

## 2020-04-03 DIAGNOSIS — M6281 Muscle weakness (generalized): Secondary | ICD-10-CM | POA: Diagnosis not present

## 2020-04-04 DIAGNOSIS — R262 Difficulty in walking, not elsewhere classified: Secondary | ICD-10-CM | POA: Diagnosis not present

## 2020-04-04 DIAGNOSIS — R2681 Unsteadiness on feet: Secondary | ICD-10-CM | POA: Diagnosis not present

## 2020-04-04 DIAGNOSIS — M6281 Muscle weakness (generalized): Secondary | ICD-10-CM | POA: Diagnosis not present

## 2020-04-05 DIAGNOSIS — R2681 Unsteadiness on feet: Secondary | ICD-10-CM | POA: Diagnosis not present

## 2020-04-05 DIAGNOSIS — I10 Essential (primary) hypertension: Secondary | ICD-10-CM | POA: Diagnosis not present

## 2020-04-05 DIAGNOSIS — M6281 Muscle weakness (generalized): Secondary | ICD-10-CM | POA: Diagnosis not present

## 2020-04-05 DIAGNOSIS — R262 Difficulty in walking, not elsewhere classified: Secondary | ICD-10-CM | POA: Diagnosis not present

## 2020-04-05 DIAGNOSIS — D7589 Other specified diseases of blood and blood-forming organs: Secondary | ICD-10-CM | POA: Diagnosis not present

## 2020-04-05 DIAGNOSIS — R634 Abnormal weight loss: Secondary | ICD-10-CM | POA: Diagnosis not present

## 2020-04-05 DIAGNOSIS — K219 Gastro-esophageal reflux disease without esophagitis: Secondary | ICD-10-CM | POA: Diagnosis not present

## 2020-04-10 DIAGNOSIS — R262 Difficulty in walking, not elsewhere classified: Secondary | ICD-10-CM | POA: Diagnosis not present

## 2020-04-10 DIAGNOSIS — M6281 Muscle weakness (generalized): Secondary | ICD-10-CM | POA: Diagnosis not present

## 2020-04-10 DIAGNOSIS — R2681 Unsteadiness on feet: Secondary | ICD-10-CM | POA: Diagnosis not present

## 2020-04-12 DIAGNOSIS — R2681 Unsteadiness on feet: Secondary | ICD-10-CM | POA: Diagnosis not present

## 2020-04-12 DIAGNOSIS — R262 Difficulty in walking, not elsewhere classified: Secondary | ICD-10-CM | POA: Diagnosis not present

## 2020-04-12 DIAGNOSIS — M6281 Muscle weakness (generalized): Secondary | ICD-10-CM | POA: Diagnosis not present

## 2020-04-13 DIAGNOSIS — R262 Difficulty in walking, not elsewhere classified: Secondary | ICD-10-CM | POA: Diagnosis not present

## 2020-04-13 DIAGNOSIS — E538 Deficiency of other specified B group vitamins: Secondary | ICD-10-CM | POA: Diagnosis not present

## 2020-04-13 DIAGNOSIS — R2681 Unsteadiness on feet: Secondary | ICD-10-CM | POA: Diagnosis not present

## 2020-04-13 DIAGNOSIS — M6281 Muscle weakness (generalized): Secondary | ICD-10-CM | POA: Diagnosis not present

## 2020-04-17 DIAGNOSIS — R2681 Unsteadiness on feet: Secondary | ICD-10-CM | POA: Diagnosis not present

## 2020-04-17 DIAGNOSIS — R262 Difficulty in walking, not elsewhere classified: Secondary | ICD-10-CM | POA: Diagnosis not present

## 2020-04-17 DIAGNOSIS — M6281 Muscle weakness (generalized): Secondary | ICD-10-CM | POA: Diagnosis not present

## 2020-04-18 DIAGNOSIS — R262 Difficulty in walking, not elsewhere classified: Secondary | ICD-10-CM | POA: Diagnosis not present

## 2020-04-18 DIAGNOSIS — M6281 Muscle weakness (generalized): Secondary | ICD-10-CM | POA: Diagnosis not present

## 2020-04-18 DIAGNOSIS — R2681 Unsteadiness on feet: Secondary | ICD-10-CM | POA: Diagnosis not present

## 2020-04-19 DIAGNOSIS — R262 Difficulty in walking, not elsewhere classified: Secondary | ICD-10-CM | POA: Diagnosis not present

## 2020-04-19 DIAGNOSIS — F0390 Unspecified dementia without behavioral disturbance: Secondary | ICD-10-CM | POA: Diagnosis not present

## 2020-04-19 DIAGNOSIS — R58 Hemorrhage, not elsewhere classified: Secondary | ICD-10-CM | POA: Diagnosis not present

## 2020-04-19 DIAGNOSIS — I10 Essential (primary) hypertension: Secondary | ICD-10-CM | POA: Diagnosis not present

## 2020-04-19 DIAGNOSIS — R2681 Unsteadiness on feet: Secondary | ICD-10-CM | POA: Diagnosis not present

## 2020-04-19 DIAGNOSIS — R635 Abnormal weight gain: Secondary | ICD-10-CM | POA: Diagnosis not present

## 2020-04-19 DIAGNOSIS — M6281 Muscle weakness (generalized): Secondary | ICD-10-CM | POA: Diagnosis not present

## 2020-04-20 DIAGNOSIS — Z20828 Contact with and (suspected) exposure to other viral communicable diseases: Secondary | ICD-10-CM | POA: Diagnosis not present

## 2020-04-24 DIAGNOSIS — R2681 Unsteadiness on feet: Secondary | ICD-10-CM | POA: Diagnosis not present

## 2020-04-24 DIAGNOSIS — R262 Difficulty in walking, not elsewhere classified: Secondary | ICD-10-CM | POA: Diagnosis not present

## 2020-04-24 DIAGNOSIS — Z20828 Contact with and (suspected) exposure to other viral communicable diseases: Secondary | ICD-10-CM | POA: Diagnosis not present

## 2020-04-24 DIAGNOSIS — M6281 Muscle weakness (generalized): Secondary | ICD-10-CM | POA: Diagnosis not present

## 2020-04-25 DIAGNOSIS — R262 Difficulty in walking, not elsewhere classified: Secondary | ICD-10-CM | POA: Diagnosis not present

## 2020-04-25 DIAGNOSIS — M6281 Muscle weakness (generalized): Secondary | ICD-10-CM | POA: Diagnosis not present

## 2020-04-25 DIAGNOSIS — R2681 Unsteadiness on feet: Secondary | ICD-10-CM | POA: Diagnosis not present

## 2020-04-26 DIAGNOSIS — R262 Difficulty in walking, not elsewhere classified: Secondary | ICD-10-CM | POA: Diagnosis not present

## 2020-04-26 DIAGNOSIS — M6281 Muscle weakness (generalized): Secondary | ICD-10-CM | POA: Diagnosis not present

## 2020-04-26 DIAGNOSIS — R2681 Unsteadiness on feet: Secondary | ICD-10-CM | POA: Diagnosis not present

## 2020-04-27 DIAGNOSIS — R262 Difficulty in walking, not elsewhere classified: Secondary | ICD-10-CM | POA: Diagnosis not present

## 2020-04-27 DIAGNOSIS — M6281 Muscle weakness (generalized): Secondary | ICD-10-CM | POA: Diagnosis not present

## 2020-04-27 DIAGNOSIS — Z20828 Contact with and (suspected) exposure to other viral communicable diseases: Secondary | ICD-10-CM | POA: Diagnosis not present

## 2020-04-27 DIAGNOSIS — R2681 Unsteadiness on feet: Secondary | ICD-10-CM | POA: Diagnosis not present

## 2020-05-01 DIAGNOSIS — R2681 Unsteadiness on feet: Secondary | ICD-10-CM | POA: Diagnosis not present

## 2020-05-01 DIAGNOSIS — M6281 Muscle weakness (generalized): Secondary | ICD-10-CM | POA: Diagnosis not present

## 2020-05-01 DIAGNOSIS — R262 Difficulty in walking, not elsewhere classified: Secondary | ICD-10-CM | POA: Diagnosis not present

## 2020-05-03 DIAGNOSIS — R2681 Unsteadiness on feet: Secondary | ICD-10-CM | POA: Diagnosis not present

## 2020-05-03 DIAGNOSIS — M6281 Muscle weakness (generalized): Secondary | ICD-10-CM | POA: Diagnosis not present

## 2020-05-03 DIAGNOSIS — R262 Difficulty in walking, not elsewhere classified: Secondary | ICD-10-CM | POA: Diagnosis not present

## 2020-05-05 DIAGNOSIS — R2681 Unsteadiness on feet: Secondary | ICD-10-CM | POA: Diagnosis not present

## 2020-05-05 DIAGNOSIS — R262 Difficulty in walking, not elsewhere classified: Secondary | ICD-10-CM | POA: Diagnosis not present

## 2020-05-05 DIAGNOSIS — M6281 Muscle weakness (generalized): Secondary | ICD-10-CM | POA: Diagnosis not present

## 2020-05-09 DIAGNOSIS — R262 Difficulty in walking, not elsewhere classified: Secondary | ICD-10-CM | POA: Diagnosis not present

## 2020-05-09 DIAGNOSIS — R2681 Unsteadiness on feet: Secondary | ICD-10-CM | POA: Diagnosis not present

## 2020-05-09 DIAGNOSIS — M6281 Muscle weakness (generalized): Secondary | ICD-10-CM | POA: Diagnosis not present

## 2020-05-10 DIAGNOSIS — R2681 Unsteadiness on feet: Secondary | ICD-10-CM | POA: Diagnosis not present

## 2020-05-10 DIAGNOSIS — R262 Difficulty in walking, not elsewhere classified: Secondary | ICD-10-CM | POA: Diagnosis not present

## 2020-05-10 DIAGNOSIS — M6281 Muscle weakness (generalized): Secondary | ICD-10-CM | POA: Diagnosis not present

## 2020-05-13 DIAGNOSIS — M6281 Muscle weakness (generalized): Secondary | ICD-10-CM | POA: Diagnosis not present

## 2020-05-13 DIAGNOSIS — R262 Difficulty in walking, not elsewhere classified: Secondary | ICD-10-CM | POA: Diagnosis not present

## 2020-05-13 DIAGNOSIS — R2681 Unsteadiness on feet: Secondary | ICD-10-CM | POA: Diagnosis not present

## 2020-05-15 DIAGNOSIS — M6281 Muscle weakness (generalized): Secondary | ICD-10-CM | POA: Diagnosis not present

## 2020-05-15 DIAGNOSIS — R262 Difficulty in walking, not elsewhere classified: Secondary | ICD-10-CM | POA: Diagnosis not present

## 2020-05-15 DIAGNOSIS — R2681 Unsteadiness on feet: Secondary | ICD-10-CM | POA: Diagnosis not present

## 2020-05-16 DIAGNOSIS — R262 Difficulty in walking, not elsewhere classified: Secondary | ICD-10-CM | POA: Diagnosis not present

## 2020-05-16 DIAGNOSIS — R2681 Unsteadiness on feet: Secondary | ICD-10-CM | POA: Diagnosis not present

## 2020-05-16 DIAGNOSIS — M6281 Muscle weakness (generalized): Secondary | ICD-10-CM | POA: Diagnosis not present

## 2020-05-22 DIAGNOSIS — Z20828 Contact with and (suspected) exposure to other viral communicable diseases: Secondary | ICD-10-CM | POA: Diagnosis not present

## 2020-05-23 DIAGNOSIS — M6281 Muscle weakness (generalized): Secondary | ICD-10-CM | POA: Diagnosis not present

## 2020-05-23 DIAGNOSIS — R262 Difficulty in walking, not elsewhere classified: Secondary | ICD-10-CM | POA: Diagnosis not present

## 2020-05-23 DIAGNOSIS — R2681 Unsteadiness on feet: Secondary | ICD-10-CM | POA: Diagnosis not present

## 2020-05-24 DIAGNOSIS — F329 Major depressive disorder, single episode, unspecified: Secondary | ICD-10-CM | POA: Diagnosis not present

## 2020-05-24 DIAGNOSIS — E538 Deficiency of other specified B group vitamins: Secondary | ICD-10-CM | POA: Diagnosis not present

## 2020-05-24 DIAGNOSIS — A084 Viral intestinal infection, unspecified: Secondary | ICD-10-CM | POA: Diagnosis not present

## 2020-05-24 DIAGNOSIS — K219 Gastro-esophageal reflux disease without esophagitis: Secondary | ICD-10-CM | POA: Diagnosis not present

## 2020-05-30 ENCOUNTER — Other Ambulatory Visit: Payer: Self-pay | Admitting: Family Medicine

## 2020-05-30 DIAGNOSIS — F0391 Unspecified dementia with behavioral disturbance: Secondary | ICD-10-CM

## 2020-05-30 DIAGNOSIS — F03B18 Unspecified dementia, moderate, with other behavioral disturbance: Secondary | ICD-10-CM

## 2020-05-31 DIAGNOSIS — M79675 Pain in left toe(s): Secondary | ICD-10-CM | POA: Diagnosis not present

## 2020-05-31 DIAGNOSIS — M79674 Pain in right toe(s): Secondary | ICD-10-CM | POA: Diagnosis not present

## 2020-05-31 DIAGNOSIS — B351 Tinea unguium: Secondary | ICD-10-CM | POA: Diagnosis not present

## 2020-06-05 DIAGNOSIS — Z20828 Contact with and (suspected) exposure to other viral communicable diseases: Secondary | ICD-10-CM | POA: Diagnosis not present

## 2020-06-28 DIAGNOSIS — R234 Changes in skin texture: Secondary | ICD-10-CM | POA: Diagnosis not present

## 2020-06-28 DIAGNOSIS — E559 Vitamin D deficiency, unspecified: Secondary | ICD-10-CM | POA: Diagnosis not present

## 2020-06-28 DIAGNOSIS — K219 Gastro-esophageal reflux disease without esophagitis: Secondary | ICD-10-CM | POA: Diagnosis not present

## 2020-06-28 DIAGNOSIS — W19XXXA Unspecified fall, initial encounter: Secondary | ICD-10-CM | POA: Diagnosis not present

## 2020-07-10 DIAGNOSIS — Z20828 Contact with and (suspected) exposure to other viral communicable diseases: Secondary | ICD-10-CM | POA: Diagnosis not present

## 2020-07-19 DIAGNOSIS — R634 Abnormal weight loss: Secondary | ICD-10-CM | POA: Diagnosis not present

## 2020-07-19 DIAGNOSIS — I10 Essential (primary) hypertension: Secondary | ICD-10-CM | POA: Diagnosis not present

## 2020-07-19 DIAGNOSIS — Z79899 Other long term (current) drug therapy: Secondary | ICD-10-CM | POA: Diagnosis not present

## 2020-07-19 DIAGNOSIS — S51011S Laceration without foreign body of right elbow, sequela: Secondary | ICD-10-CM | POA: Diagnosis not present

## 2020-07-31 DIAGNOSIS — Z20828 Contact with and (suspected) exposure to other viral communicable diseases: Secondary | ICD-10-CM | POA: Diagnosis not present

## 2020-08-03 DIAGNOSIS — Z79899 Other long term (current) drug therapy: Secondary | ICD-10-CM | POA: Diagnosis not present

## 2020-08-07 DIAGNOSIS — Z20828 Contact with and (suspected) exposure to other viral communicable diseases: Secondary | ICD-10-CM | POA: Diagnosis not present

## 2020-08-09 DIAGNOSIS — W19XXXA Unspecified fall, initial encounter: Secondary | ICD-10-CM | POA: Diagnosis not present

## 2020-08-09 DIAGNOSIS — E870 Hyperosmolality and hypernatremia: Secondary | ICD-10-CM | POA: Diagnosis not present

## 2020-08-09 DIAGNOSIS — D7589 Other specified diseases of blood and blood-forming organs: Secondary | ICD-10-CM | POA: Diagnosis not present

## 2020-08-09 DIAGNOSIS — R739 Hyperglycemia, unspecified: Secondary | ICD-10-CM | POA: Diagnosis not present

## 2020-08-21 DIAGNOSIS — Z20828 Contact with and (suspected) exposure to other viral communicable diseases: Secondary | ICD-10-CM | POA: Diagnosis not present

## 2020-08-23 DIAGNOSIS — R739 Hyperglycemia, unspecified: Secondary | ICD-10-CM | POA: Diagnosis not present

## 2020-08-23 DIAGNOSIS — D7589 Other specified diseases of blood and blood-forming organs: Secondary | ICD-10-CM | POA: Diagnosis not present

## 2020-08-23 DIAGNOSIS — K219 Gastro-esophageal reflux disease without esophagitis: Secondary | ICD-10-CM | POA: Diagnosis not present

## 2020-08-23 DIAGNOSIS — I4891 Unspecified atrial fibrillation: Secondary | ICD-10-CM | POA: Diagnosis not present

## 2020-08-23 DIAGNOSIS — F5109 Other insomnia not due to a substance or known physiological condition: Secondary | ICD-10-CM | POA: Diagnosis not present

## 2020-08-24 DIAGNOSIS — B351 Tinea unguium: Secondary | ICD-10-CM | POA: Diagnosis not present

## 2020-08-24 DIAGNOSIS — M79675 Pain in left toe(s): Secondary | ICD-10-CM | POA: Diagnosis not present

## 2020-08-24 DIAGNOSIS — Z20828 Contact with and (suspected) exposure to other viral communicable diseases: Secondary | ICD-10-CM | POA: Diagnosis not present

## 2020-08-24 DIAGNOSIS — M79674 Pain in right toe(s): Secondary | ICD-10-CM | POA: Diagnosis not present

## 2020-08-29 DIAGNOSIS — Z20828 Contact with and (suspected) exposure to other viral communicable diseases: Secondary | ICD-10-CM | POA: Diagnosis not present

## 2020-08-31 DIAGNOSIS — D7589 Other specified diseases of blood and blood-forming organs: Secondary | ICD-10-CM | POA: Diagnosis not present

## 2020-08-31 DIAGNOSIS — R739 Hyperglycemia, unspecified: Secondary | ICD-10-CM | POA: Diagnosis not present

## 2020-08-31 DIAGNOSIS — Z20828 Contact with and (suspected) exposure to other viral communicable diseases: Secondary | ICD-10-CM | POA: Diagnosis not present

## 2020-09-06 DIAGNOSIS — I10 Essential (primary) hypertension: Secondary | ICD-10-CM | POA: Diagnosis not present

## 2020-09-06 DIAGNOSIS — R790 Abnormal level of blood mineral: Secondary | ICD-10-CM | POA: Diagnosis not present

## 2020-09-06 DIAGNOSIS — E7439 Other disorders of intestinal carbohydrate absorption: Secondary | ICD-10-CM | POA: Diagnosis not present

## 2020-09-06 DIAGNOSIS — R635 Abnormal weight gain: Secondary | ICD-10-CM | POA: Diagnosis not present

## 2020-09-13 DIAGNOSIS — E538 Deficiency of other specified B group vitamins: Secondary | ICD-10-CM | POA: Diagnosis not present

## 2020-09-13 DIAGNOSIS — F0391 Unspecified dementia with behavioral disturbance: Secondary | ICD-10-CM | POA: Diagnosis not present

## 2020-09-13 DIAGNOSIS — R2681 Unsteadiness on feet: Secondary | ICD-10-CM | POA: Diagnosis not present

## 2020-09-13 DIAGNOSIS — R296 Repeated falls: Secondary | ICD-10-CM | POA: Diagnosis not present

## 2020-09-18 DIAGNOSIS — Z79899 Other long term (current) drug therapy: Secondary | ICD-10-CM | POA: Diagnosis not present

## 2020-09-18 DIAGNOSIS — N390 Urinary tract infection, site not specified: Secondary | ICD-10-CM | POA: Diagnosis not present

## 2020-09-27 DIAGNOSIS — R3129 Other microscopic hematuria: Secondary | ICD-10-CM | POA: Diagnosis not present

## 2020-09-27 DIAGNOSIS — R451 Restlessness and agitation: Secondary | ICD-10-CM | POA: Diagnosis not present

## 2020-09-27 DIAGNOSIS — K219 Gastro-esophageal reflux disease without esophagitis: Secondary | ICD-10-CM | POA: Diagnosis not present

## 2020-10-04 ENCOUNTER — Encounter (HOSPITAL_COMMUNITY): Payer: Self-pay | Admitting: Emergency Medicine

## 2020-10-04 ENCOUNTER — Other Ambulatory Visit: Payer: Self-pay

## 2020-10-04 ENCOUNTER — Emergency Department (HOSPITAL_COMMUNITY): Payer: Medicare Other

## 2020-10-04 ENCOUNTER — Emergency Department (HOSPITAL_COMMUNITY)
Admission: EM | Admit: 2020-10-04 | Discharge: 2020-10-04 | Disposition: A | Discharge status: Home / Self Care | Payer: Medicare Other | Attending: Emergency Medicine | Admitting: Emergency Medicine

## 2020-10-04 DIAGNOSIS — I10 Essential (primary) hypertension: Secondary | ICD-10-CM | POA: Diagnosis not present

## 2020-10-04 DIAGNOSIS — M940 Chondrocostal junction syndrome [Tietze]: Secondary | ICD-10-CM | POA: Diagnosis not present

## 2020-10-04 DIAGNOSIS — R0781 Pleurodynia: Secondary | ICD-10-CM | POA: Diagnosis not present

## 2020-10-04 DIAGNOSIS — F0391 Unspecified dementia with behavioral disturbance: Secondary | ICD-10-CM | POA: Diagnosis not present

## 2020-10-04 DIAGNOSIS — R079 Chest pain, unspecified: Secondary | ICD-10-CM | POA: Diagnosis not present

## 2020-10-04 DIAGNOSIS — Z85820 Personal history of malignant melanoma of skin: Secondary | ICD-10-CM | POA: Insufficient documentation

## 2020-10-04 DIAGNOSIS — R531 Weakness: Secondary | ICD-10-CM | POA: Diagnosis not present

## 2020-10-04 DIAGNOSIS — R4182 Altered mental status, unspecified: Secondary | ICD-10-CM | POA: Diagnosis not present

## 2020-10-04 DIAGNOSIS — Z87891 Personal history of nicotine dependence: Secondary | ICD-10-CM | POA: Diagnosis not present

## 2020-10-04 DIAGNOSIS — R0603 Acute respiratory distress: Secondary | ICD-10-CM | POA: Insufficient documentation

## 2020-10-04 DIAGNOSIS — Z20822 Contact with and (suspected) exposure to covid-19: Secondary | ICD-10-CM | POA: Diagnosis not present

## 2020-10-04 DIAGNOSIS — R0789 Other chest pain: Secondary | ICD-10-CM

## 2020-10-04 DIAGNOSIS — Z7401 Bed confinement status: Secondary | ICD-10-CM | POA: Diagnosis not present

## 2020-10-04 LAB — TROPONIN I (HIGH SENSITIVITY)
Troponin I (High Sensitivity): 2 ng/L (ref <18)
Troponin I (High Sensitivity): <2 ng/L (ref <18)

## 2020-10-04 LAB — CBC WITH DIFFERENTIAL/PLATELET
Abs Immature Granulocytes: 0.01 10*3/uL (ref 0.00–0.07)
Basophils Absolute: 0.1 10*3/uL (ref 0.0–0.1)
Basophils Relative: 1 %
Eosinophils Absolute: 0.1 10*3/uL (ref 0.0–0.5)
Eosinophils Relative: 3 %
HCT: 38.5 % — ABNORMAL LOW (ref 39.0–52.0)
Hemoglobin: 12.6 g/dL — ABNORMAL LOW (ref 13.0–17.0)
Immature Granulocytes: 0 %
Lymphocytes Relative: 26 %
Lymphs Abs: 1.3 10*3/uL (ref 0.7–4.0)
MCH: 30.5 pg (ref 26.0–34.0)
MCHC: 32.7 g/dL (ref 30.0–36.0)
MCV: 93.2 fL (ref 80.0–100.0)
Monocytes Absolute: 0.5 10*3/uL (ref 0.1–1.0)
Monocytes Relative: 10 %
Neutro Abs: 3 10*3/uL (ref 1.7–7.7)
Neutrophils Relative %: 60 %
Platelets: 117 10*3/uL — ABNORMAL LOW (ref 150–400)
RBC: 4.13 MIL/uL — ABNORMAL LOW (ref 4.22–5.81)
RDW: 13.5 % (ref 11.5–15.5)
WBC: 5 10*3/uL (ref 4.0–10.5)
nRBC: 0 % (ref 0.0–0.2)

## 2020-10-04 LAB — BASIC METABOLIC PANEL
Anion gap: 6 (ref 5–15)
BUN: 24 mg/dL — ABNORMAL HIGH (ref 8–23)
CO2: 28 mmol/L (ref 22–32)
Calcium: 9 mg/dL (ref 8.9–10.3)
Chloride: 108 mmol/L (ref 98–111)
Creatinine, Ser: 1.1 mg/dL (ref 0.61–1.24)
GFR, Estimated: >60 mL/min (ref >60)
Glucose, Bld: 84 mg/dL (ref 70–99)
Potassium: 3.9 mmol/L (ref 3.5–5.1)
Sodium: 142 mmol/L (ref 135–145)

## 2020-10-04 LAB — RESP PANEL BY RT-PCR (FLU A&B, COVID) ARPGX2
Influenza A by PCR: NEGATIVE
Influenza B by PCR: NEGATIVE
SARS Coronavirus 2 by RT PCR: NEGATIVE

## 2020-10-04 LAB — BRAIN NATRIURETIC PEPTIDE: B Natriuretic Peptide: 24.5 pg/mL (ref 0.0–100.0)

## 2020-10-04 LAB — MAGNESIUM: Magnesium: 2.3 mg/dL (ref 1.7–2.4)

## 2020-10-04 MED ORDER — KETOROLAC TROMETHAMINE 15 MG/ML IJ SOLN
15.0000 mg | Freq: Once | INTRAMUSCULAR | Status: AC
  Administered 2020-10-04: 15 mg via INTRAVENOUS
  Filled 2020-10-04: qty 1

## 2020-10-04 MED ORDER — LIDOCAINE 5 % EX PTCH
1.0000 | MEDICATED_PATCH | CUTANEOUS | Status: DC
  Administered 2020-10-04: 1 via TRANSDERMAL
  Filled 2020-10-04: qty 1

## 2020-10-04 NOTE — ED Triage Notes (Signed)
BIBA Per EMS: Pt coming from [facility] with complaints of L rib pain with palpation ; facial grimacing  No injury or falls that they are aware of  Hx dementia, pt at baseline per facility  All vitals WDL

## 2020-10-04 NOTE — ED Provider Notes (Signed)
[facility] DEPT Provider Note   CSN: [id number] Arrival date & time: 10/04/20  0732     History Chief Complaint  Patient presents with   Chest Pain    L rib pain     Patient is an 80 year old male with pmh of dementia presenting via EMS from [facility] facility for complaints of chest pain.  On my evaluation patient in alert but nonverbal. Does not respond to questioning in physical manor. Nursing home facility has confirmed this is patient's baseline and states chest pain is new. Denies fever, chills, coughing, vomiting, or sick contacts.  Patient points to left chest wall and has shows evidence of distress when palpated left of the sternum ribs 4-6.     The history is provided by the patient and the nursing home. No language interpreter was used.  Chest Pain Pain location:  L chest Pain quality comment:  Unknown Pain severity:  Unable to specify Onset quality:  Unable to specify Duration: new.     Past Medical History:  Diagnosis Date   Aortic insufficiency     mild...echo.Marland KitchenMarland Kitchen3/2007....Marland KitchenMarland KitchenAortic root...upper normal ...echo.Marland KitchenMarland Kitchen3/2007   Barrett esophagus    BPH (benign prostatic hypertrophy)    Chest pain    May, 2012   Colonic polyp    Ejection fraction    55-60% EF, echo, 2007   GERD (gastroesophageal reflux disease)    History of colonic polyps 10/13/2006   No polyps 2015 age 49-no further colonscopies     HTN (hypertension)    Malignant melanoma of other specified sites of skin    Mitral regurgitation    Mild, echo, 2007   Nephrolithiasis    Paroxysmal atrial fibrillation (HCC)    One episode remotely   RBBB (right bundle branch block)    SVT (supraventricular tachycardia) (HCC)    Tremor    Essential tremor    Patient Active Problem List   Diagnosis Date Noted   Chronic pruritic rash in adult 03/24/2018   Senile purpura ([place]) 03/06/2017   Voiding difficulty 03/06/2017   Vitamin B 12 deficiency 03/29/2016    Moderate dementia with behavioral disturbance ([place]) 03/06/2016   Allergic rhinitis 02/09/2014   Insomnia 02/09/2014   Paroxysmal atrial fibrillation (HCC)    Mitral regurgitation    Aortic insufficiency    History of supraventricular tachycardia    HTN (hypertension)    RBBB (right bundle branch block)    Essential tremor    History of melanoma 05/31/2008   NEPHROLITHIASIS, HX OF 05/31/2008   BPH (benign prostatic hyperplasia) 05/31/2008   GERD 07/30/2007   Barrett's esophagus with dysplasia 07/30/2007    Past Surgical History:  Procedure Laterality Date   CHOLECYSTECTOMY  02/13/1999   LC and IOC by Dr Leafy Kindle   esophageal ablation  08/2013   at least 6 times   Marrowbone   melanoma removal Left 1992   shoulder   MENISCUS REPAIR Right 2012   MOHS SURGERY  2011   forehead   NISSEN FUNDOPLICATION  circa [id number]   Dr [place]       Family History  Problem Relation Age of Onset   Stroke Father 53   Colon cancer Father 4   Atrial fibrillation Sister    Colon cancer Paternal Grandfather     Social History   Tobacco Use   Smoking status: Former    Packs/day: 0.25  Years: 6.00    Pack years: 1.50    Types: Cigarettes    Quit date: 02/25/1973    Years since quitting: 47.6   Smokeless tobacco: Never  Vaping Use   Vaping Use: Never used  Substance Use Topics   Alcohol use: Not Currently   Drug use: No    Home Medications   Allergies    Iohexol  Review of Systems   Review of Systems  Unable to perform ROS: Patient nonverbal  Cardiovascular:  Positive for chest pain.   Physical Exam Updated Vital Signs BP 126/69 (BP Location: Right Arm)   Pulse (!) 54   Temp 97.8 F (36.6 C) (Oral)   Resp 16   SpO2 95%   Physical Exam Vitals and nursing note reviewed.  Constitutional:      Appearance: He is well-developed.  HENT:     Head: Normocephalic and atraumatic.  Eyes:     Conjunctiva/sclera:  Conjunctivae normal.  Cardiovascular:     Rate and Rhythm: Normal rate and regular rhythm.     Heart sounds: No murmur heard. Pulmonary:     Effort: Pulmonary effort is normal. No respiratory distress.     Breath sounds: Normal breath sounds.  Chest:     Chest wall: Tenderness present.    Abdominal:     Palpations: Abdomen is soft.     Tenderness: There is no abdominal tenderness.  Musculoskeletal:     Cervical back: Neck supple.     Right lower leg: No edema.     Left lower leg: No edema.  Skin:    General: Skin is warm and dry.     Capillary Refill: Capillary refill takes less than 2 seconds.  Neurological:     Mental Status: He is alert. Mental status is at baseline.     Comments: Nonverbal Does not follow commands to speech Moves all four extremities without difficulty    ED Results / Procedures / Treatments   Labs (all labs ordered are listed, but only abnormal results are displayed) Labs Reviewed  CBC WITH DIFFERENTIAL/PLATELET - Abnormal; Notable for the following components:      Result Value   RBC 4.13 (*)    Hemoglobin 12.6 (*)    HCT 38.5 (*)    Platelets 117 (*)    All other components within normal limits  BASIC METABOLIC PANEL - Abnormal; Notable for the following components:   BUN 24 (*)    All other components within normal limits  RESP PANEL BY RT-PCR (FLU A&B, COVID) ARPGX2  BRAIN NATRIURETIC PEPTIDE  MAGNESIUM  TROPONIN I (HIGH SENSITIVITY)  TROPONIN I (HIGH SENSITIVITY)    EKG Please see interpretation below.   Radiology DG Ribs Unilateral W/Chest Left  Result Date: 10/04/2020 CLINICAL DATA:  Left rib pain EXAM: LEFT RIBS AND CHEST - 3+ VIEW COMPARISON:  None. FINDINGS: The heart is not enlarged. The mediastinal contours are within normal limits. The lungs are well inflated. There is no focal consolidation or pulmonary edema. There is no pleural effusion or pneumothorax. Upper abdominal surgical clips are noted No displaced rib fracture seen.  IMPRESSION: No displaced rib fracture identified. Electronically Signed   By: Valetta Mole MD   On: 10/04/2020 09:01    Procedures Procedures   Medications Ordered in ED Medications  ketorolac (TORADOL) 15 MG/ML injection 15 mg (15 mg Intravenous Given 10/04/20 1327)    ED Course  I have reviewed the triage vital signs and the nursing notes.  Pertinent labs &  imaging results that were available during my care of the patient were reviewed by me and considered in my medical decision making (see chart for details).    MDM Rules/Calculators/A&P                          80 year old male presenting for complaints of chest pain.  Patient is alert and oriented x3, no acute distress, afebrile, with stable vital signs. On exam pt is nonverbal-baseline as per nursing home. Physical exam demonstrates producible chest wall tenderness without evidence of trauma.   -EKG stable with no ST segment elevation or depression.  Stable troponin. Doubt ACS.  -BNP and chest x-ray stable. No pneumothorax.  No pleural effusions. No pneumonia.  -Pt is afebrile with no coughing or leukocytosis. Doubt infectious etiology.  -Pt has no signs of respiratory distress of labored breathing. No hypoxia. No tachycardia. No unilateral leg swelling. Doubt PE.   Patient's pain likely secondary to costochondritis due to its reproducible nature.  Dedicated x-ray rib series demonstrates no rib fractures. Pt ordered Lidoderm pain patch and Toradol.  Recommended for motrin/tylenol for symptomatic relief. Discharged home to nursing home facility.       Final Clinical Impression(s) / ED Diagnoses Final diagnoses:  Chest wall pain  Costochondritis    Rx / DC Orders ED Discharge Orders     None        Lianne Cure, DO [id number] [phone]

## 2020-10-05 DIAGNOSIS — Z20828 Contact with and (suspected) exposure to other viral communicable diseases: Secondary | ICD-10-CM | POA: Diagnosis not present

## 2020-10-09 DIAGNOSIS — Z20828 Contact with and (suspected) exposure to other viral communicable diseases: Secondary | ICD-10-CM | POA: Diagnosis not present

## 2020-10-11 DIAGNOSIS — I10 Essential (primary) hypertension: Secondary | ICD-10-CM | POA: Diagnosis not present

## 2020-10-11 DIAGNOSIS — F329 Major depressive disorder, single episode, unspecified: Secondary | ICD-10-CM | POA: Diagnosis not present

## 2020-10-11 DIAGNOSIS — M940 Chondrocostal junction syndrome [Tietze]: Secondary | ICD-10-CM | POA: Diagnosis not present

## 2020-10-12 DIAGNOSIS — Z8616 Personal history of COVID-19: Secondary | ICD-10-CM | POA: Diagnosis not present

## 2020-10-16 DIAGNOSIS — Z20828 Contact with and (suspected) exposure to other viral communicable diseases: Secondary | ICD-10-CM | POA: Diagnosis not present

## 2020-10-18 DIAGNOSIS — I4891 Unspecified atrial fibrillation: Secondary | ICD-10-CM | POA: Diagnosis not present

## 2020-10-18 DIAGNOSIS — R2681 Unsteadiness on feet: Secondary | ICD-10-CM | POA: Diagnosis not present

## 2020-10-18 DIAGNOSIS — W19XXXA Unspecified fall, initial encounter: Secondary | ICD-10-CM | POA: Diagnosis not present

## 2020-10-18 DIAGNOSIS — F0390 Unspecified dementia without behavioral disturbance: Secondary | ICD-10-CM | POA: Diagnosis not present

## 2020-10-19 DIAGNOSIS — Z20828 Contact with and (suspected) exposure to other viral communicable diseases: Secondary | ICD-10-CM | POA: Diagnosis not present

## 2020-10-23 DIAGNOSIS — Z20828 Contact with and (suspected) exposure to other viral communicable diseases: Secondary | ICD-10-CM | POA: Diagnosis not present

## 2020-10-26 DIAGNOSIS — Z20828 Contact with and (suspected) exposure to other viral communicable diseases: Secondary | ICD-10-CM | POA: Diagnosis not present

## 2020-10-30 DIAGNOSIS — Z20828 Contact with and (suspected) exposure to other viral communicable diseases: Secondary | ICD-10-CM | POA: Diagnosis not present

## 2020-11-01 DIAGNOSIS — W19XXXA Unspecified fall, initial encounter: Secondary | ICD-10-CM | POA: Diagnosis not present

## 2020-11-01 DIAGNOSIS — E538 Deficiency of other specified B group vitamins: Secondary | ICD-10-CM | POA: Diagnosis not present

## 2020-11-01 DIAGNOSIS — K219 Gastro-esophageal reflux disease without esophagitis: Secondary | ICD-10-CM | POA: Diagnosis not present

## 2020-11-01 DIAGNOSIS — R2681 Unsteadiness on feet: Secondary | ICD-10-CM | POA: Diagnosis not present

## 2020-11-02 ENCOUNTER — Other Ambulatory Visit: Payer: Self-pay

## 2020-11-02 ENCOUNTER — Emergency Department (HOSPITAL_COMMUNITY): Payer: Medicare Other

## 2020-11-02 ENCOUNTER — Emergency Department (HOSPITAL_COMMUNITY)
Admission: EM | Admit: 2020-11-02 | Discharge: 2020-11-02 | Disposition: A | Discharge status: Home / Self Care | Payer: Medicare Other | Attending: Student | Admitting: Student

## 2020-11-02 ENCOUNTER — Encounter (HOSPITAL_COMMUNITY): Payer: Self-pay | Admitting: Radiology

## 2020-11-02 DIAGNOSIS — S32039A Unspecified fracture of third lumbar vertebra, initial encounter for closed fracture: Secondary | ICD-10-CM | POA: Diagnosis not present

## 2020-11-02 DIAGNOSIS — S0240DA Maxillary fracture, left side, initial encounter for closed fracture: Secondary | ICD-10-CM | POA: Diagnosis not present

## 2020-11-02 DIAGNOSIS — S0993XA Unspecified injury of face, initial encounter: Secondary | ICD-10-CM | POA: Diagnosis not present

## 2020-11-02 DIAGNOSIS — S0121XA Laceration without foreign body of nose, initial encounter: Secondary | ICD-10-CM | POA: Diagnosis not present

## 2020-11-02 DIAGNOSIS — S02401A Maxillary fracture, unspecified, initial encounter for closed fracture: Secondary | ICD-10-CM | POA: Diagnosis not present

## 2020-11-02 DIAGNOSIS — S3991XA Unspecified injury of abdomen, initial encounter: Secondary | ICD-10-CM | POA: Insufficient documentation

## 2020-11-02 DIAGNOSIS — S2232XA Fracture of one rib, left side, initial encounter for closed fracture: Secondary | ICD-10-CM | POA: Insufficient documentation

## 2020-11-02 DIAGNOSIS — T1490XA Injury, unspecified, initial encounter: Secondary | ICD-10-CM

## 2020-11-02 DIAGNOSIS — Z043 Encounter for examination and observation following other accident: Secondary | ICD-10-CM | POA: Diagnosis not present

## 2020-11-02 DIAGNOSIS — M4314 Spondylolisthesis, thoracic region: Secondary | ICD-10-CM | POA: Diagnosis not present

## 2020-11-02 DIAGNOSIS — Y92129 Unspecified place in nursing home as the place of occurrence of the external cause: Secondary | ICD-10-CM | POA: Diagnosis not present

## 2020-11-02 DIAGNOSIS — S32030A Wedge compression fracture of third lumbar vertebra, initial encounter for closed fracture: Secondary | ICD-10-CM | POA: Diagnosis not present

## 2020-11-02 DIAGNOSIS — N200 Calculus of kidney: Secondary | ICD-10-CM | POA: Diagnosis not present

## 2020-11-02 DIAGNOSIS — Z7401 Bed confinement status: Secondary | ICD-10-CM | POA: Diagnosis not present

## 2020-11-02 DIAGNOSIS — R4182 Altered mental status, unspecified: Secondary | ICD-10-CM | POA: Diagnosis not present

## 2020-11-02 DIAGNOSIS — S2242XA Multiple fractures of ribs, left side, initial encounter for closed fracture: Secondary | ICD-10-CM | POA: Diagnosis not present

## 2020-11-02 DIAGNOSIS — I10 Essential (primary) hypertension: Secondary | ICD-10-CM | POA: Diagnosis not present

## 2020-11-02 DIAGNOSIS — Z79899 Other long term (current) drug therapy: Secondary | ICD-10-CM | POA: Insufficient documentation

## 2020-11-02 DIAGNOSIS — Z87891 Personal history of nicotine dependence: Secondary | ICD-10-CM | POA: Diagnosis not present

## 2020-11-02 DIAGNOSIS — W19XXXA Unspecified fall, initial encounter: Secondary | ICD-10-CM | POA: Insufficient documentation

## 2020-11-02 DIAGNOSIS — S0003XA Contusion of scalp, initial encounter: Secondary | ICD-10-CM | POA: Diagnosis not present

## 2020-11-02 DIAGNOSIS — S0990XA Unspecified injury of head, initial encounter: Secondary | ICD-10-CM | POA: Diagnosis present

## 2020-11-02 DIAGNOSIS — Z85820 Personal history of malignant melanoma of skin: Secondary | ICD-10-CM | POA: Diagnosis not present

## 2020-11-02 DIAGNOSIS — N281 Cyst of kidney, acquired: Secondary | ICD-10-CM | POA: Diagnosis not present

## 2020-11-02 DIAGNOSIS — F0390 Unspecified dementia without behavioral disturbance: Secondary | ICD-10-CM | POA: Insufficient documentation

## 2020-11-02 DIAGNOSIS — M4313 Spondylolisthesis, cervicothoracic region: Secondary | ICD-10-CM | POA: Diagnosis not present

## 2020-11-02 LAB — PROTIME-INR
INR: 1 (ref 0.8–1.2)
Prothrombin Time: 13.6 seconds (ref 11.4–15.2)

## 2020-11-02 LAB — COMPREHENSIVE METABOLIC PANEL
ALT: 21 U/L (ref 0–44)
AST: 44 U/L — ABNORMAL HIGH (ref 15–41)
Albumin: 4.2 g/dL (ref 3.5–5.0)
Alkaline Phosphatase: 76 U/L (ref 38–126)
Anion gap: 12 (ref 5–15)
BUN: 22 mg/dL (ref 8–23)
CO2: 24 mmol/L (ref 22–32)
Calcium: 9.3 mg/dL (ref 8.9–10.3)
Chloride: 103 mmol/L (ref 98–111)
Creatinine, Ser: 1.08 mg/dL (ref 0.61–1.24)
GFR, Estimated: >60 mL/min (ref >60)
Glucose, Bld: 106 mg/dL — ABNORMAL HIGH (ref 70–99)
Potassium: 3.8 mmol/L (ref 3.5–5.1)
Sodium: 139 mmol/L (ref 135–145)
Total Bilirubin: 0.9 mg/dL (ref 0.3–1.2)
Total Protein: 7.1 g/dL (ref 6.5–8.1)

## 2020-11-02 LAB — CBC
HCT: 39.2 % (ref 39.0–52.0)
Hemoglobin: 13 g/dL (ref 13.0–17.0)
MCH: 31 pg (ref 26.0–34.0)
MCHC: 33.2 g/dL (ref 30.0–36.0)
MCV: 93.6 fL (ref 80.0–100.0)
Platelets: 130 10*3/uL — ABNORMAL LOW (ref 150–400)
RBC: 4.19 MIL/uL — ABNORMAL LOW (ref 4.22–5.81)
RDW: 13.7 % (ref 11.5–15.5)
WBC: 11.1 10*3/uL — ABNORMAL HIGH (ref 4.0–10.5)
nRBC: 0 % (ref 0.0–0.2)

## 2020-11-02 LAB — URINALYSIS, ROUTINE W REFLEX MICROSCOPIC
Bilirubin Urine: NEGATIVE
Glucose, UA: NEGATIVE mg/dL
Ketones, ur: NEGATIVE mg/dL
Leukocytes,Ua: NEGATIVE
Nitrite: NEGATIVE
Protein, ur: NEGATIVE mg/dL
Specific Gravity, Urine: 1.035 — ABNORMAL HIGH (ref 1.005–1.030)
pH: 9 — ABNORMAL HIGH (ref 5.0–8.0)

## 2020-11-02 LAB — CK: Total CK: 1016 U/L — ABNORMAL HIGH (ref 49–397)

## 2020-11-02 LAB — I-STAT CHEM 8, ED
BUN: 22 mg/dL (ref 8–23)
Calcium, Ion: 1.27 mmol/L (ref 1.15–1.40)
Chloride: 106 mmol/L (ref 98–111)
Creatinine, Ser: 1 mg/dL (ref 0.61–1.24)
Glucose, Bld: 101 mg/dL — ABNORMAL HIGH (ref 70–99)
HCT: 41 % (ref 39.0–52.0)
Hemoglobin: 13.9 g/dL (ref 13.0–17.0)
Potassium: 3.8 mmol/L (ref 3.5–5.1)
Sodium: 141 mmol/L (ref 135–145)
TCO2: 24 mmol/L (ref 22–32)

## 2020-11-02 LAB — ETHANOL: Alcohol, Ethyl (B): <10 mg/dL (ref <10)

## 2020-11-02 LAB — LACTIC ACID, PLASMA
Lactic Acid, Venous: 2.8 mmol/L (ref 0.5–1.9)
Lactic Acid, Venous: 1.2 mmol/L (ref 0.5–1.9)

## 2020-11-02 MED ORDER — AMOXICILLIN-POT CLAVULANATE 875-125 MG PO TABS: 1.0000 | ORAL_TABLET | Freq: Two times a day (BID) | ORAL | 0 refills | Status: AC

## 2020-11-02 MED ORDER — LACTATED RINGERS IV BOLUS
500.0000 mL | Freq: Once | INTRAVENOUS | Status: AC
  Administered 2020-11-02: 500 mL via INTRAVENOUS

## 2020-11-02 MED ORDER — IOHEXOL 350 MG/ML SOLN
75.0000 mL | Freq: Once | INTRAVENOUS | Status: AC | PRN
  Administered 2020-11-02: 75 mL via INTRAVENOUS

## 2020-11-02 MED ORDER — SODIUM CHLORIDE 0.9 % IV SOLN
3.0000 g | Freq: Once | INTRAVENOUS | Status: AC
  Administered 2020-11-02: 3 g via INTRAVENOUS
  Filled 2020-11-02: qty 8

## 2020-11-02 NOTE — ED Notes (Signed)
Shantae,nurse from nursing home wants a call back with an update on this patient, [phone].

## 2020-11-02 NOTE — ED Triage Notes (Signed)
Pt presents to ED via ems cc fall. Per EMS, pt had unwitnessed fall at [facility]. Per ems, pt was found by staff. Pt noted to have bruising under both eyes. Pt has diffuse dried blood to face, chest, bilateral arms. Per ems, pt has dementia and is at mental baseline. Pt alert, oriented to self.

## 2020-11-02 NOTE — ED Provider Notes (Signed)
[facility] DEPT Provider Note   CSN: [id number] Arrival date & time: 11/02/20  [id number]     History No chief complaint on file.   Larry Mejia is a 80 y.o. male with PMH advanced dementia, nonverbal, HTN who presents from [facility] memory care facility after a fall.  Facility states that the patient was seen in his bed at 530 and was found on the ground at 6:15 AM.  Patient arrives covered in dried blood over his face and trunk.  There is a small laceration over the nasal bridge.  Patient arrives in a c-collar and is nonverbal, unable to provide any history.  No blood thinners seen on his med history.  HPI     Past Medical History:  Diagnosis Date   Aortic insufficiency     mild...echo.Marland KitchenMarland Kitchen3/2007....Marland KitchenMarland KitchenAortic root...upper normal ...echo.Marland KitchenMarland Kitchen3/2007   Barrett esophagus    BPH (benign prostatic hypertrophy)    Chest pain    May, 2012   Colonic polyp    Ejection fraction    55-60% EF, echo, 2007   GERD (gastroesophageal reflux disease)    History of colonic polyps 10/13/2006   No polyps 2015 age 64-no further colonscopies     HTN (hypertension)    Malignant melanoma of other specified sites of skin    Mitral regurgitation    Mild, echo, 2007   Nephrolithiasis    Paroxysmal atrial fibrillation (HCC)    One episode remotely   RBBB (right bundle branch block)    SVT (supraventricular tachycardia) (HCC)    Tremor    Essential tremor    Patient Active Problem List   Diagnosis Date Noted   Chronic pruritic rash in adult 03/24/2018   Senile purpura ([place]) 03/06/2017   Voiding difficulty 03/06/2017   Vitamin B 12 deficiency 03/29/2016   Moderate dementia with behavioral disturbance ([place]) 03/06/2016   Allergic rhinitis 02/09/2014   Insomnia 02/09/2014   Paroxysmal atrial fibrillation (HCC)    Mitral regurgitation    Aortic insufficiency    History of supraventricular tachycardia    HTN (hypertension)    RBBB (right bundle branch block)     Essential tremor    History of melanoma 05/31/2008   NEPHROLITHIASIS, HX OF 05/31/2008   BPH (benign prostatic hyperplasia) 05/31/2008   GERD 07/30/2007   Barrett's esophagus with dysplasia 07/30/2007    Past Surgical History:  Procedure Laterality Date   CHOLECYSTECTOMY  02/13/1999   LC and IOC by Dr Leafy Kindle   esophageal ablation  08/2013   at least 6 times   [place]   melanoma removal Left 1992   shoulder   MENISCUS REPAIR Right 2012   MOHS SURGERY  2011   forehead   NISSEN FUNDOPLICATION  circa [id number]   Dr [place]       Family History  Problem Relation Age of Onset   Stroke Father 20   Colon cancer Father 47   Atrial fibrillation Sister    Colon cancer Paternal Grandfather     Social History   Tobacco Use   Smoking status: Former    Packs/day: 0.25    Years: 6.00    Pack years: 1.50    Types: Cigarettes    Quit date: 02/25/1973    Years since quitting: 47.7   Smokeless tobacco: Never  Vaping Use   Vaping Use: Never used  Substance Use Topics   Alcohol use: Not Currently  Drug use: No    Home Medications Prior to Admission medications   Medication Sig Start Date End Date Taking? Authorizing Provider  acetaminophen (TYLENOL) 325 MG tablet Take 650 mg by mouth every 6 (six) hours as needed for mild pain or fever.    [provider]  amLODipine (NORVASC) 5 MG tablet Take 5 mg by mouth daily.    [provider]  Cholecalciferol (VITAMIN D3) 25 MCG (1000 UT) CHEW Chew 25 mcg by mouth daily.    [provider]  donepezil (ARICEPT) 10 MG tablet Take 10 mg by mouth at bedtime.    [provider]  Ensure (ENSURE) Take 237 mLs by mouth in the morning and at bedtime.    [provider]  escitalopram (LEXAPRO) 20 MG tablet Take 20 mg by mouth daily.    [provider]  esomeprazole (NEXIUM) 40 MG capsule Take 40 mg by mouth 2 (two) times daily before a  meal.    [provider]  loperamide (IMODIUM A-D) 2 MG tablet Take 2 mg by mouth every 6 (six) hours as needed for diarrhea or loose stools.    [provider]  LORazepam (ATIVAN) 0.5 MG tablet Take 0.5 mg by mouth 2 (two) times daily as needed for anxiety.    [provider]  Multiple Vitamin (DAILY VITE) TABS Take 1 tablet by mouth daily.    [provider]  promethazine (PHENERGAN) 25 MG tablet Take 25 mg by mouth every 6 (six) hours as needed for nausea or vomiting.    [provider]  QUEtiapine (SEROQUEL) 25 MG tablet Take 25 mg by mouth at bedtime.    [provider]  traZODone (DESYREL) 50 MG tablet Take 25 mg by mouth at bedtime.    [provider]  vitamin B-12 (CYANOCOBALAMIN) 250 MCG tablet Take 250 mcg by mouth daily.    [provider]    Allergies    Iohexol  Review of Systems   Review of Systems  Unable to perform ROS: Patient nonverbal   Physical Exam Updated Vital Signs There were no vitals taken for this visit.  Physical Exam Vitals and nursing note reviewed.  Constitutional:      Appearance: He is well-developed. He is ill-appearing.  HENT:     Head: Normocephalic.     Comments: Dried blood over the face, swelling to the nasal bridge, half centimeter laceration over the nasal bridge, bilateral periorbital edema and bruising Eyes:     Conjunctiva/sclera: Conjunctivae normal.  Cardiovascular:     Rate and Rhythm: Normal rate and regular rhythm.     Heart sounds: No murmur heard. Pulmonary:     Effort: Pulmonary effort is normal. No respiratory distress.     Breath sounds: Normal breath sounds.  Abdominal:     Palpations: Abdomen is soft.     Tenderness: There is no abdominal tenderness.  Musculoskeletal:        General: Tenderness (Right shoulder) present.     Cervical back: Neck supple.  Skin:    General: Skin is warm and dry.  Neurological:     Mental Status: He is alert. Mental  status is at baseline. He is disoriented.    ED Results / Procedures / Treatments   Labs (all labs ordered are listed, but only abnormal results are displayed) Labs Reviewed - No data to display  EKG None  Radiology No results found.  Procedures Procedures   Medications Ordered in ED Medications - No  data to display  ED Course  I have reviewed the triage vital signs and the nursing notes.  Pertinent labs & imaging results that were available during my care of the patient were reviewed by me and considered in my medical decision making (see chart for details).    MDM Rules/Calculators/A&P                           Patient seen the emergency department for evaluation of a fall.  Physical exam reveals a disoriented ill-appearing patient with dried blood over the face with suspicion that this is coming from a small laceration over the nasal bridge.  Patient has swelling and tenderness over the nasal bridge but no additional external signs of trauma.  Laboratory evaluation with a leukocytosis to 11.1 likely stress demargination in the setting of pain from his recent fall.  Urinalysis unremarkable, CK elevated to 1016 and initial lactate 0.8.  He was given 500 cc of fluid and repeat lactate 1.2.  Trauma pan scan imaging reveals a left posterior maxillary sinus fracture with blood in bilateral sinuses.  CT head unremarkable.  Spine with no acute traumatic injury.  CT L-spine and T-spine with age-indeterminate compression fractures T2-T5, T7 and T8, age-indeterminate compression fracture at L3.  I spoke with neurosurgery about these age-indeterminate fractures and he states that these would be nonoperative and does not require outpatient neurosurgical follow-up.  ENT was consulted as well who recommended outpatient follow-up and 1 week of Augmentin for his orbital fracture.  CT chest with a nondisplaced left-sided rib fracture.  Patient was given single dose Unasyn here in the emergency  department and as all of his traumatic injuries can be followed in the outpatient setting, he will be discharged back to his facility with ENT follow-up next week.  They waited the patient and his vital signs have improved, does not appear to be in significant pain.  I attempted to contact the patient's POA multiple times but was unable to reach him.  My cell phone number was left for him and I will update him when he calls.  I updated the patient's nursing home with instructions on antibiotic use and ENT follow-up.  Patient then discharged back to his nursing facility. Final Clinical Impression(s) / ED Diagnoses Final diagnoses:  None    Rx / DC Orders ED Discharge Orders     None        Zander Ingham, MD 11/02/20 1314

## 2020-11-02 NOTE — ED Notes (Signed)
Pure wick in place will give sample when able to.

## 2020-11-08 DIAGNOSIS — S0285XS Fracture of orbit, unspecified, sequela: Secondary | ICD-10-CM | POA: Diagnosis not present

## 2020-11-08 DIAGNOSIS — S32030A Wedge compression fracture of third lumbar vertebra, initial encounter for closed fracture: Secondary | ICD-10-CM | POA: Diagnosis not present

## 2020-11-08 DIAGNOSIS — S2232XS Fracture of one rib, left side, sequela: Secondary | ICD-10-CM | POA: Diagnosis not present

## 2020-11-08 DIAGNOSIS — Z23 Encounter for immunization: Secondary | ICD-10-CM | POA: Diagnosis not present

## 2020-11-08 DIAGNOSIS — I10 Essential (primary) hypertension: Secondary | ICD-10-CM | POA: Diagnosis not present

## 2020-11-08 DIAGNOSIS — R296 Repeated falls: Secondary | ICD-10-CM | POA: Diagnosis not present

## 2020-11-08 DIAGNOSIS — S0012XS Contusion of left eyelid and periocular area, sequela: Secondary | ICD-10-CM | POA: Diagnosis not present

## 2020-11-09 DIAGNOSIS — S0285XA Fracture of orbit, unspecified, initial encounter for closed fracture: Secondary | ICD-10-CM | POA: Diagnosis not present

## 2020-11-09 DIAGNOSIS — I10 Essential (primary) hypertension: Secondary | ICD-10-CM | POA: Diagnosis not present

## 2020-11-09 DIAGNOSIS — R296 Repeated falls: Secondary | ICD-10-CM | POA: Diagnosis not present

## 2020-11-09 DIAGNOSIS — S0012XA Contusion of left eyelid and periocular area, initial encounter: Secondary | ICD-10-CM | POA: Diagnosis not present

## 2020-11-09 DIAGNOSIS — S32030A Wedge compression fracture of third lumbar vertebra, initial encounter for closed fracture: Secondary | ICD-10-CM | POA: Diagnosis not present

## 2020-11-13 DIAGNOSIS — Z20828 Contact with and (suspected) exposure to other viral communicable diseases: Secondary | ICD-10-CM | POA: Diagnosis not present

## 2020-11-15 DIAGNOSIS — G311 Senile degeneration of brain, not elsewhere classified: Secondary | ICD-10-CM | POA: Diagnosis not present

## 2020-11-15 DIAGNOSIS — I4510 Unspecified right bundle-branch block: Secondary | ICD-10-CM | POA: Diagnosis not present

## 2020-11-15 DIAGNOSIS — Z85820 Personal history of malignant melanoma of skin: Secondary | ICD-10-CM | POA: Diagnosis not present

## 2020-11-15 DIAGNOSIS — K2270 Barrett's esophagus without dysplasia: Secondary | ICD-10-CM | POA: Diagnosis not present

## 2020-11-15 DIAGNOSIS — G250 Essential tremor: Secondary | ICD-10-CM | POA: Diagnosis not present

## 2020-11-15 DIAGNOSIS — K219 Gastro-esophageal reflux disease without esophagitis: Secondary | ICD-10-CM | POA: Diagnosis not present

## 2020-11-15 DIAGNOSIS — J302 Other seasonal allergic rhinitis: Secondary | ICD-10-CM | POA: Diagnosis not present

## 2020-11-15 DIAGNOSIS — R1310 Dysphagia, unspecified: Secondary | ICD-10-CM | POA: Diagnosis not present

## 2020-11-15 DIAGNOSIS — Z8679 Personal history of other diseases of the circulatory system: Secondary | ICD-10-CM | POA: Diagnosis not present

## 2020-11-15 DIAGNOSIS — G4700 Insomnia, unspecified: Secondary | ICD-10-CM | POA: Diagnosis not present

## 2020-11-15 DIAGNOSIS — Z8781 Personal history of (healed) traumatic fracture: Secondary | ICD-10-CM | POA: Diagnosis not present

## 2020-11-15 DIAGNOSIS — K635 Polyp of colon: Secondary | ICD-10-CM | POA: Diagnosis not present

## 2020-11-15 DIAGNOSIS — Z87440 Personal history of urinary (tract) infections: Secondary | ICD-10-CM | POA: Diagnosis not present

## 2020-11-15 DIAGNOSIS — I081 Rheumatic disorders of both mitral and tricuspid valves: Secondary | ICD-10-CM | POA: Diagnosis not present

## 2020-11-15 DIAGNOSIS — K4090 Unilateral inguinal hernia, without obstruction or gangrene, not specified as recurrent: Secondary | ICD-10-CM | POA: Diagnosis not present

## 2020-11-15 DIAGNOSIS — Z87442 Personal history of urinary calculi: Secondary | ICD-10-CM | POA: Diagnosis not present

## 2020-11-15 DIAGNOSIS — R296 Repeated falls: Secondary | ICD-10-CM | POA: Diagnosis not present

## 2020-11-15 DIAGNOSIS — F0281 Dementia in other diseases classified elsewhere with behavioral disturbance: Secondary | ICD-10-CM | POA: Diagnosis not present

## 2020-11-15 DIAGNOSIS — I10 Essential (primary) hypertension: Secondary | ICD-10-CM | POA: Diagnosis not present

## 2020-11-15 DIAGNOSIS — N400 Enlarged prostate without lower urinary tract symptoms: Secondary | ICD-10-CM | POA: Diagnosis not present

## 2020-11-15 DIAGNOSIS — I480 Paroxysmal atrial fibrillation: Secondary | ICD-10-CM | POA: Diagnosis not present

## 2020-11-16 DIAGNOSIS — G311 Senile degeneration of brain, not elsewhere classified: Secondary | ICD-10-CM | POA: Diagnosis not present

## 2020-11-16 DIAGNOSIS — R296 Repeated falls: Secondary | ICD-10-CM | POA: Diagnosis not present

## 2020-11-16 DIAGNOSIS — F0281 Dementia in other diseases classified elsewhere with behavioral disturbance: Secondary | ICD-10-CM | POA: Diagnosis not present

## 2020-11-16 DIAGNOSIS — R1310 Dysphagia, unspecified: Secondary | ICD-10-CM | POA: Diagnosis not present

## 2020-11-16 DIAGNOSIS — K2270 Barrett's esophagus without dysplasia: Secondary | ICD-10-CM | POA: Diagnosis not present

## 2020-11-16 DIAGNOSIS — Z87440 Personal history of urinary (tract) infections: Secondary | ICD-10-CM | POA: Diagnosis not present

## 2020-11-17 DIAGNOSIS — K2270 Barrett's esophagus without dysplasia: Secondary | ICD-10-CM | POA: Diagnosis not present

## 2020-11-17 DIAGNOSIS — F0281 Dementia in other diseases classified elsewhere with behavioral disturbance: Secondary | ICD-10-CM | POA: Diagnosis not present

## 2020-11-17 DIAGNOSIS — G311 Senile degeneration of brain, not elsewhere classified: Secondary | ICD-10-CM | POA: Diagnosis not present

## 2020-11-17 DIAGNOSIS — Z87440 Personal history of urinary (tract) infections: Secondary | ICD-10-CM | POA: Diagnosis not present

## 2020-11-17 DIAGNOSIS — R296 Repeated falls: Secondary | ICD-10-CM | POA: Diagnosis not present

## 2020-11-17 DIAGNOSIS — R1310 Dysphagia, unspecified: Secondary | ICD-10-CM | POA: Diagnosis not present

## 2020-11-20 DIAGNOSIS — K2270 Barrett's esophagus without dysplasia: Secondary | ICD-10-CM | POA: Diagnosis not present

## 2020-11-20 DIAGNOSIS — R296 Repeated falls: Secondary | ICD-10-CM | POA: Diagnosis not present

## 2020-11-20 DIAGNOSIS — Z87440 Personal history of urinary (tract) infections: Secondary | ICD-10-CM | POA: Diagnosis not present

## 2020-11-20 DIAGNOSIS — R1310 Dysphagia, unspecified: Secondary | ICD-10-CM | POA: Diagnosis not present

## 2020-11-20 DIAGNOSIS — Z20828 Contact with and (suspected) exposure to other viral communicable diseases: Secondary | ICD-10-CM | POA: Diagnosis not present

## 2020-11-20 DIAGNOSIS — F0281 Dementia in other diseases classified elsewhere with behavioral disturbance: Secondary | ICD-10-CM | POA: Diagnosis not present

## 2020-11-20 DIAGNOSIS — G311 Senile degeneration of brain, not elsewhere classified: Secondary | ICD-10-CM | POA: Diagnosis not present

## 2020-11-23 DIAGNOSIS — Z8616 Personal history of COVID-19: Secondary | ICD-10-CM | POA: Diagnosis not present

## 2020-11-24 DIAGNOSIS — G311 Senile degeneration of brain, not elsewhere classified: Secondary | ICD-10-CM | POA: Diagnosis not present

## 2020-11-24 DIAGNOSIS — F0281 Dementia in other diseases classified elsewhere with behavioral disturbance: Secondary | ICD-10-CM | POA: Diagnosis not present

## 2020-11-24 DIAGNOSIS — R1310 Dysphagia, unspecified: Secondary | ICD-10-CM | POA: Diagnosis not present

## 2020-11-24 DIAGNOSIS — Z87440 Personal history of urinary (tract) infections: Secondary | ICD-10-CM | POA: Diagnosis not present

## 2020-11-24 DIAGNOSIS — R296 Repeated falls: Secondary | ICD-10-CM | POA: Diagnosis not present

## 2020-11-24 DIAGNOSIS — K2270 Barrett's esophagus without dysplasia: Secondary | ICD-10-CM | POA: Diagnosis not present

## 2020-11-25 DIAGNOSIS — G4700 Insomnia, unspecified: Secondary | ICD-10-CM | POA: Diagnosis not present

## 2020-11-25 DIAGNOSIS — Z87442 Personal history of urinary calculi: Secondary | ICD-10-CM | POA: Diagnosis not present

## 2020-11-25 DIAGNOSIS — J302 Other seasonal allergic rhinitis: Secondary | ICD-10-CM | POA: Diagnosis not present

## 2020-11-25 DIAGNOSIS — I480 Paroxysmal atrial fibrillation: Secondary | ICD-10-CM | POA: Diagnosis not present

## 2020-11-25 DIAGNOSIS — G311 Senile degeneration of brain, not elsewhere classified: Secondary | ICD-10-CM | POA: Diagnosis not present

## 2020-11-25 DIAGNOSIS — I4510 Unspecified right bundle-branch block: Secondary | ICD-10-CM | POA: Diagnosis not present

## 2020-11-25 DIAGNOSIS — R1310 Dysphagia, unspecified: Secondary | ICD-10-CM | POA: Diagnosis not present

## 2020-11-25 DIAGNOSIS — K219 Gastro-esophageal reflux disease without esophagitis: Secondary | ICD-10-CM | POA: Diagnosis not present

## 2020-11-25 DIAGNOSIS — F02818 Dementia in other diseases classified elsewhere, unspecified severity, with other behavioral disturbance: Secondary | ICD-10-CM | POA: Diagnosis not present

## 2020-11-25 DIAGNOSIS — Z85820 Personal history of malignant melanoma of skin: Secondary | ICD-10-CM | POA: Diagnosis not present

## 2020-11-25 DIAGNOSIS — R296 Repeated falls: Secondary | ICD-10-CM | POA: Diagnosis not present

## 2020-11-25 DIAGNOSIS — I10 Essential (primary) hypertension: Secondary | ICD-10-CM | POA: Diagnosis not present

## 2020-11-25 DIAGNOSIS — G250 Essential tremor: Secondary | ICD-10-CM | POA: Diagnosis not present

## 2020-11-25 DIAGNOSIS — Z87440 Personal history of urinary (tract) infections: Secondary | ICD-10-CM | POA: Diagnosis not present

## 2020-11-25 DIAGNOSIS — K635 Polyp of colon: Secondary | ICD-10-CM | POA: Diagnosis not present

## 2020-11-25 DIAGNOSIS — K2270 Barrett's esophagus without dysplasia: Secondary | ICD-10-CM | POA: Diagnosis not present

## 2020-11-25 DIAGNOSIS — I081 Rheumatic disorders of both mitral and tricuspid valves: Secondary | ICD-10-CM | POA: Diagnosis not present

## 2020-11-25 DIAGNOSIS — K4090 Unilateral inguinal hernia, without obstruction or gangrene, not specified as recurrent: Secondary | ICD-10-CM | POA: Diagnosis not present

## 2020-11-25 DIAGNOSIS — Z8781 Personal history of (healed) traumatic fracture: Secondary | ICD-10-CM | POA: Diagnosis not present

## 2020-11-25 DIAGNOSIS — N400 Enlarged prostate without lower urinary tract symptoms: Secondary | ICD-10-CM | POA: Diagnosis not present

## 2020-11-25 DIAGNOSIS — Z8679 Personal history of other diseases of the circulatory system: Secondary | ICD-10-CM | POA: Diagnosis not present

## 2020-11-27 DIAGNOSIS — F02818 Dementia in other diseases classified elsewhere, unspecified severity, with other behavioral disturbance: Secondary | ICD-10-CM | POA: Diagnosis not present

## 2020-11-27 DIAGNOSIS — R1310 Dysphagia, unspecified: Secondary | ICD-10-CM | POA: Diagnosis not present

## 2020-11-27 DIAGNOSIS — G311 Senile degeneration of brain, not elsewhere classified: Secondary | ICD-10-CM | POA: Diagnosis not present

## 2020-11-27 DIAGNOSIS — Z87440 Personal history of urinary (tract) infections: Secondary | ICD-10-CM | POA: Diagnosis not present

## 2020-11-27 DIAGNOSIS — R296 Repeated falls: Secondary | ICD-10-CM | POA: Diagnosis not present

## 2020-11-27 DIAGNOSIS — K2270 Barrett's esophagus without dysplasia: Secondary | ICD-10-CM | POA: Diagnosis not present

## 2020-11-28 DIAGNOSIS — Z8616 Personal history of COVID-19: Secondary | ICD-10-CM | POA: Diagnosis not present

## 2020-11-29 DIAGNOSIS — R634 Abnormal weight loss: Secondary | ICD-10-CM | POA: Diagnosis not present

## 2020-11-29 DIAGNOSIS — R1310 Dysphagia, unspecified: Secondary | ICD-10-CM | POA: Diagnosis not present

## 2020-11-29 DIAGNOSIS — R296 Repeated falls: Secondary | ICD-10-CM | POA: Diagnosis not present

## 2020-11-29 DIAGNOSIS — G311 Senile degeneration of brain, not elsewhere classified: Secondary | ICD-10-CM | POA: Diagnosis not present

## 2020-11-29 DIAGNOSIS — Z515 Encounter for palliative care: Secondary | ICD-10-CM | POA: Diagnosis not present

## 2020-11-29 DIAGNOSIS — F02818 Dementia in other diseases classified elsewhere, unspecified severity, with other behavioral disturbance: Secondary | ICD-10-CM | POA: Diagnosis not present

## 2020-11-29 DIAGNOSIS — Z87440 Personal history of urinary (tract) infections: Secondary | ICD-10-CM | POA: Diagnosis not present

## 2020-11-29 DIAGNOSIS — K2270 Barrett's esophagus without dysplasia: Secondary | ICD-10-CM | POA: Diagnosis not present

## 2020-11-29 DIAGNOSIS — F02811 Dementia in other diseases classified elsewhere, unspecified severity, with agitation: Secondary | ICD-10-CM | POA: Diagnosis not present

## 2020-11-29 DIAGNOSIS — I482 Chronic atrial fibrillation, unspecified: Secondary | ICD-10-CM | POA: Diagnosis not present

## 2020-12-01 DIAGNOSIS — R296 Repeated falls: Secondary | ICD-10-CM | POA: Diagnosis not present

## 2020-12-01 DIAGNOSIS — F02818 Dementia in other diseases classified elsewhere, unspecified severity, with other behavioral disturbance: Secondary | ICD-10-CM | POA: Diagnosis not present

## 2020-12-01 DIAGNOSIS — K2270 Barrett's esophagus without dysplasia: Secondary | ICD-10-CM | POA: Diagnosis not present

## 2020-12-01 DIAGNOSIS — G311 Senile degeneration of brain, not elsewhere classified: Secondary | ICD-10-CM | POA: Diagnosis not present

## 2020-12-01 DIAGNOSIS — R1310 Dysphagia, unspecified: Secondary | ICD-10-CM | POA: Diagnosis not present

## 2020-12-01 DIAGNOSIS — Z87440 Personal history of urinary (tract) infections: Secondary | ICD-10-CM | POA: Diagnosis not present

## 2020-12-04 DIAGNOSIS — F02818 Dementia in other diseases classified elsewhere, unspecified severity, with other behavioral disturbance: Secondary | ICD-10-CM | POA: Diagnosis not present

## 2020-12-04 DIAGNOSIS — K2270 Barrett's esophagus without dysplasia: Secondary | ICD-10-CM | POA: Diagnosis not present

## 2020-12-04 DIAGNOSIS — R1310 Dysphagia, unspecified: Secondary | ICD-10-CM | POA: Diagnosis not present

## 2020-12-04 DIAGNOSIS — Z87440 Personal history of urinary (tract) infections: Secondary | ICD-10-CM | POA: Diagnosis not present

## 2020-12-04 DIAGNOSIS — G311 Senile degeneration of brain, not elsewhere classified: Secondary | ICD-10-CM | POA: Diagnosis not present

## 2020-12-04 DIAGNOSIS — R296 Repeated falls: Secondary | ICD-10-CM | POA: Diagnosis not present

## 2020-12-06 DIAGNOSIS — F331 Major depressive disorder, recurrent, moderate: Secondary | ICD-10-CM | POA: Diagnosis not present

## 2020-12-06 DIAGNOSIS — R451 Restlessness and agitation: Secondary | ICD-10-CM | POA: Diagnosis not present

## 2020-12-06 DIAGNOSIS — Z515 Encounter for palliative care: Secondary | ICD-10-CM | POA: Diagnosis not present

## 2020-12-06 DIAGNOSIS — E538 Deficiency of other specified B group vitamins: Secondary | ICD-10-CM | POA: Diagnosis not present

## 2020-12-07 DIAGNOSIS — R1310 Dysphagia, unspecified: Secondary | ICD-10-CM | POA: Diagnosis not present

## 2020-12-07 DIAGNOSIS — K2270 Barrett's esophagus without dysplasia: Secondary | ICD-10-CM | POA: Diagnosis not present

## 2020-12-07 DIAGNOSIS — Z8616 Personal history of COVID-19: Secondary | ICD-10-CM | POA: Diagnosis not present

## 2020-12-07 DIAGNOSIS — F02818 Dementia in other diseases classified elsewhere, unspecified severity, with other behavioral disturbance: Secondary | ICD-10-CM | POA: Diagnosis not present

## 2020-12-07 DIAGNOSIS — Z87440 Personal history of urinary (tract) infections: Secondary | ICD-10-CM | POA: Diagnosis not present

## 2020-12-07 DIAGNOSIS — R296 Repeated falls: Secondary | ICD-10-CM | POA: Diagnosis not present

## 2020-12-07 DIAGNOSIS — G311 Senile degeneration of brain, not elsewhere classified: Secondary | ICD-10-CM | POA: Diagnosis not present

## 2020-12-08 DIAGNOSIS — F02818 Dementia in other diseases classified elsewhere, unspecified severity, with other behavioral disturbance: Secondary | ICD-10-CM | POA: Diagnosis not present

## 2020-12-08 DIAGNOSIS — M79675 Pain in left toe(s): Secondary | ICD-10-CM | POA: Diagnosis not present

## 2020-12-08 DIAGNOSIS — R1310 Dysphagia, unspecified: Secondary | ICD-10-CM | POA: Diagnosis not present

## 2020-12-08 DIAGNOSIS — G311 Senile degeneration of brain, not elsewhere classified: Secondary | ICD-10-CM | POA: Diagnosis not present

## 2020-12-08 DIAGNOSIS — R296 Repeated falls: Secondary | ICD-10-CM | POA: Diagnosis not present

## 2020-12-08 DIAGNOSIS — M79674 Pain in right toe(s): Secondary | ICD-10-CM | POA: Diagnosis not present

## 2020-12-08 DIAGNOSIS — Z87440 Personal history of urinary (tract) infections: Secondary | ICD-10-CM | POA: Diagnosis not present

## 2020-12-08 DIAGNOSIS — K2270 Barrett's esophagus without dysplasia: Secondary | ICD-10-CM | POA: Diagnosis not present

## 2020-12-08 DIAGNOSIS — B351 Tinea unguium: Secondary | ICD-10-CM | POA: Diagnosis not present

## 2020-12-11 DIAGNOSIS — F02818 Dementia in other diseases classified elsewhere, unspecified severity, with other behavioral disturbance: Secondary | ICD-10-CM | POA: Diagnosis not present

## 2020-12-11 DIAGNOSIS — Z87440 Personal history of urinary (tract) infections: Secondary | ICD-10-CM | POA: Diagnosis not present

## 2020-12-11 DIAGNOSIS — G311 Senile degeneration of brain, not elsewhere classified: Secondary | ICD-10-CM | POA: Diagnosis not present

## 2020-12-11 DIAGNOSIS — R1310 Dysphagia, unspecified: Secondary | ICD-10-CM | POA: Diagnosis not present

## 2020-12-11 DIAGNOSIS — R296 Repeated falls: Secondary | ICD-10-CM | POA: Diagnosis not present

## 2020-12-11 DIAGNOSIS — K2270 Barrett's esophagus without dysplasia: Secondary | ICD-10-CM | POA: Diagnosis not present

## 2020-12-13 DIAGNOSIS — R296 Repeated falls: Secondary | ICD-10-CM | POA: Diagnosis not present

## 2020-12-13 DIAGNOSIS — G311 Senile degeneration of brain, not elsewhere classified: Secondary | ICD-10-CM | POA: Diagnosis not present

## 2020-12-13 DIAGNOSIS — F02818 Dementia in other diseases classified elsewhere, unspecified severity, with other behavioral disturbance: Secondary | ICD-10-CM | POA: Diagnosis not present

## 2020-12-13 DIAGNOSIS — K2270 Barrett's esophagus without dysplasia: Secondary | ICD-10-CM | POA: Diagnosis not present

## 2020-12-13 DIAGNOSIS — R1310 Dysphagia, unspecified: Secondary | ICD-10-CM | POA: Diagnosis not present

## 2020-12-13 DIAGNOSIS — Z87440 Personal history of urinary (tract) infections: Secondary | ICD-10-CM | POA: Diagnosis not present

## 2020-12-15 DIAGNOSIS — Z87440 Personal history of urinary (tract) infections: Secondary | ICD-10-CM | POA: Diagnosis not present

## 2020-12-15 DIAGNOSIS — R1310 Dysphagia, unspecified: Secondary | ICD-10-CM | POA: Diagnosis not present

## 2020-12-15 DIAGNOSIS — R296 Repeated falls: Secondary | ICD-10-CM | POA: Diagnosis not present

## 2020-12-15 DIAGNOSIS — K2270 Barrett's esophagus without dysplasia: Secondary | ICD-10-CM | POA: Diagnosis not present

## 2020-12-15 DIAGNOSIS — G311 Senile degeneration of brain, not elsewhere classified: Secondary | ICD-10-CM | POA: Diagnosis not present

## 2020-12-15 DIAGNOSIS — F02818 Dementia in other diseases classified elsewhere, unspecified severity, with other behavioral disturbance: Secondary | ICD-10-CM | POA: Diagnosis not present

## 2020-12-18 DIAGNOSIS — F02818 Dementia in other diseases classified elsewhere, unspecified severity, with other behavioral disturbance: Secondary | ICD-10-CM | POA: Diagnosis not present

## 2020-12-18 DIAGNOSIS — R1310 Dysphagia, unspecified: Secondary | ICD-10-CM | POA: Diagnosis not present

## 2020-12-18 DIAGNOSIS — G311 Senile degeneration of brain, not elsewhere classified: Secondary | ICD-10-CM | POA: Diagnosis not present

## 2020-12-18 DIAGNOSIS — R296 Repeated falls: Secondary | ICD-10-CM | POA: Diagnosis not present

## 2020-12-18 DIAGNOSIS — K2270 Barrett's esophagus without dysplasia: Secondary | ICD-10-CM | POA: Diagnosis not present

## 2020-12-18 DIAGNOSIS — Z87440 Personal history of urinary (tract) infections: Secondary | ICD-10-CM | POA: Diagnosis not present

## 2020-12-20 DIAGNOSIS — G311 Senile degeneration of brain, not elsewhere classified: Secondary | ICD-10-CM | POA: Diagnosis not present

## 2020-12-20 DIAGNOSIS — R296 Repeated falls: Secondary | ICD-10-CM | POA: Diagnosis not present

## 2020-12-20 DIAGNOSIS — R2681 Unsteadiness on feet: Secondary | ICD-10-CM | POA: Diagnosis not present

## 2020-12-20 DIAGNOSIS — Z87440 Personal history of urinary (tract) infections: Secondary | ICD-10-CM | POA: Diagnosis not present

## 2020-12-20 DIAGNOSIS — K219 Gastro-esophageal reflux disease without esophagitis: Secondary | ICD-10-CM | POA: Diagnosis not present

## 2020-12-20 DIAGNOSIS — R1310 Dysphagia, unspecified: Secondary | ICD-10-CM | POA: Diagnosis not present

## 2020-12-20 DIAGNOSIS — K2270 Barrett's esophagus without dysplasia: Secondary | ICD-10-CM | POA: Diagnosis not present

## 2020-12-20 DIAGNOSIS — Z515 Encounter for palliative care: Secondary | ICD-10-CM | POA: Diagnosis not present

## 2020-12-20 DIAGNOSIS — F02818 Dementia in other diseases classified elsewhere, unspecified severity, with other behavioral disturbance: Secondary | ICD-10-CM | POA: Diagnosis not present

## 2020-12-20 DIAGNOSIS — I10 Essential (primary) hypertension: Secondary | ICD-10-CM | POA: Diagnosis not present

## 2020-12-20 DIAGNOSIS — W19XXXA Unspecified fall, initial encounter: Secondary | ICD-10-CM | POA: Diagnosis not present

## 2020-12-22 DIAGNOSIS — F02818 Dementia in other diseases classified elsewhere, unspecified severity, with other behavioral disturbance: Secondary | ICD-10-CM | POA: Diagnosis not present

## 2020-12-22 DIAGNOSIS — R1310 Dysphagia, unspecified: Secondary | ICD-10-CM | POA: Diagnosis not present

## 2020-12-22 DIAGNOSIS — G311 Senile degeneration of brain, not elsewhere classified: Secondary | ICD-10-CM | POA: Diagnosis not present

## 2020-12-22 DIAGNOSIS — Z87440 Personal history of urinary (tract) infections: Secondary | ICD-10-CM | POA: Diagnosis not present

## 2020-12-22 DIAGNOSIS — K2270 Barrett's esophagus without dysplasia: Secondary | ICD-10-CM | POA: Diagnosis not present

## 2020-12-22 DIAGNOSIS — R296 Repeated falls: Secondary | ICD-10-CM | POA: Diagnosis not present

## 2020-12-25 DIAGNOSIS — F02818 Dementia in other diseases classified elsewhere, unspecified severity, with other behavioral disturbance: Secondary | ICD-10-CM | POA: Diagnosis not present

## 2020-12-25 DIAGNOSIS — G311 Senile degeneration of brain, not elsewhere classified: Secondary | ICD-10-CM | POA: Diagnosis not present

## 2020-12-25 DIAGNOSIS — R1310 Dysphagia, unspecified: Secondary | ICD-10-CM | POA: Diagnosis not present

## 2020-12-25 DIAGNOSIS — R296 Repeated falls: Secondary | ICD-10-CM | POA: Diagnosis not present

## 2020-12-25 DIAGNOSIS — K2270 Barrett's esophagus without dysplasia: Secondary | ICD-10-CM | POA: Diagnosis not present

## 2020-12-25 DIAGNOSIS — Z87440 Personal history of urinary (tract) infections: Secondary | ICD-10-CM | POA: Diagnosis not present

## 2020-12-26 DIAGNOSIS — Z87440 Personal history of urinary (tract) infections: Secondary | ICD-10-CM | POA: Diagnosis not present

## 2020-12-26 DIAGNOSIS — I10 Essential (primary) hypertension: Secondary | ICD-10-CM | POA: Diagnosis not present

## 2020-12-26 DIAGNOSIS — I480 Paroxysmal atrial fibrillation: Secondary | ICD-10-CM | POA: Diagnosis not present

## 2020-12-26 DIAGNOSIS — Z87442 Personal history of urinary calculi: Secondary | ICD-10-CM | POA: Diagnosis not present

## 2020-12-26 DIAGNOSIS — G250 Essential tremor: Secondary | ICD-10-CM | POA: Diagnosis not present

## 2020-12-26 DIAGNOSIS — G311 Senile degeneration of brain, not elsewhere classified: Secondary | ICD-10-CM | POA: Diagnosis not present

## 2020-12-26 DIAGNOSIS — R1310 Dysphagia, unspecified: Secondary | ICD-10-CM | POA: Diagnosis not present

## 2020-12-26 DIAGNOSIS — Z8781 Personal history of (healed) traumatic fracture: Secondary | ICD-10-CM | POA: Diagnosis not present

## 2020-12-26 DIAGNOSIS — F02818 Dementia in other diseases classified elsewhere, unspecified severity, with other behavioral disturbance: Secondary | ICD-10-CM | POA: Diagnosis not present

## 2020-12-26 DIAGNOSIS — Z85820 Personal history of malignant melanoma of skin: Secondary | ICD-10-CM | POA: Diagnosis not present

## 2020-12-26 DIAGNOSIS — K219 Gastro-esophageal reflux disease without esophagitis: Secondary | ICD-10-CM | POA: Diagnosis not present

## 2020-12-26 DIAGNOSIS — N400 Enlarged prostate without lower urinary tract symptoms: Secondary | ICD-10-CM | POA: Diagnosis not present

## 2020-12-26 DIAGNOSIS — K635 Polyp of colon: Secondary | ICD-10-CM | POA: Diagnosis not present

## 2020-12-26 DIAGNOSIS — Z8679 Personal history of other diseases of the circulatory system: Secondary | ICD-10-CM | POA: Diagnosis not present

## 2020-12-26 DIAGNOSIS — K4090 Unilateral inguinal hernia, without obstruction or gangrene, not specified as recurrent: Secondary | ICD-10-CM | POA: Diagnosis not present

## 2020-12-26 DIAGNOSIS — K2270 Barrett's esophagus without dysplasia: Secondary | ICD-10-CM | POA: Diagnosis not present

## 2020-12-26 DIAGNOSIS — I081 Rheumatic disorders of both mitral and tricuspid valves: Secondary | ICD-10-CM | POA: Diagnosis not present

## 2020-12-26 DIAGNOSIS — R296 Repeated falls: Secondary | ICD-10-CM | POA: Diagnosis not present

## 2020-12-26 DIAGNOSIS — J302 Other seasonal allergic rhinitis: Secondary | ICD-10-CM | POA: Diagnosis not present

## 2020-12-26 DIAGNOSIS — I4510 Unspecified right bundle-branch block: Secondary | ICD-10-CM | POA: Diagnosis not present

## 2020-12-26 DIAGNOSIS — G4700 Insomnia, unspecified: Secondary | ICD-10-CM | POA: Diagnosis not present

## 2020-12-28 DIAGNOSIS — Z87440 Personal history of urinary (tract) infections: Secondary | ICD-10-CM | POA: Diagnosis not present

## 2020-12-28 DIAGNOSIS — R296 Repeated falls: Secondary | ICD-10-CM | POA: Diagnosis not present

## 2020-12-28 DIAGNOSIS — G311 Senile degeneration of brain, not elsewhere classified: Secondary | ICD-10-CM | POA: Diagnosis not present

## 2020-12-28 DIAGNOSIS — R1310 Dysphagia, unspecified: Secondary | ICD-10-CM | POA: Diagnosis not present

## 2020-12-28 DIAGNOSIS — K2270 Barrett's esophagus without dysplasia: Secondary | ICD-10-CM | POA: Diagnosis not present

## 2020-12-28 DIAGNOSIS — F02818 Dementia in other diseases classified elsewhere, unspecified severity, with other behavioral disturbance: Secondary | ICD-10-CM | POA: Diagnosis not present

## 2021-01-01 DIAGNOSIS — F02818 Dementia in other diseases classified elsewhere, unspecified severity, with other behavioral disturbance: Secondary | ICD-10-CM | POA: Diagnosis not present

## 2021-01-01 DIAGNOSIS — Z87440 Personal history of urinary (tract) infections: Secondary | ICD-10-CM | POA: Diagnosis not present

## 2021-01-01 DIAGNOSIS — R1310 Dysphagia, unspecified: Secondary | ICD-10-CM | POA: Diagnosis not present

## 2021-01-01 DIAGNOSIS — G311 Senile degeneration of brain, not elsewhere classified: Secondary | ICD-10-CM | POA: Diagnosis not present

## 2021-01-01 DIAGNOSIS — R296 Repeated falls: Secondary | ICD-10-CM | POA: Diagnosis not present

## 2021-01-01 DIAGNOSIS — K2270 Barrett's esophagus without dysplasia: Secondary | ICD-10-CM | POA: Diagnosis not present

## 2021-01-03 DIAGNOSIS — R1310 Dysphagia, unspecified: Secondary | ICD-10-CM | POA: Diagnosis not present

## 2021-01-03 DIAGNOSIS — Z87440 Personal history of urinary (tract) infections: Secondary | ICD-10-CM | POA: Diagnosis not present

## 2021-01-03 DIAGNOSIS — F02818 Dementia in other diseases classified elsewhere, unspecified severity, with other behavioral disturbance: Secondary | ICD-10-CM | POA: Diagnosis not present

## 2021-01-03 DIAGNOSIS — G311 Senile degeneration of brain, not elsewhere classified: Secondary | ICD-10-CM | POA: Diagnosis not present

## 2021-01-03 DIAGNOSIS — R296 Repeated falls: Secondary | ICD-10-CM | POA: Diagnosis not present

## 2021-01-03 DIAGNOSIS — K2270 Barrett's esophagus without dysplasia: Secondary | ICD-10-CM | POA: Diagnosis not present

## 2021-01-04 DIAGNOSIS — F02818 Dementia in other diseases classified elsewhere, unspecified severity, with other behavioral disturbance: Secondary | ICD-10-CM | POA: Diagnosis not present

## 2021-01-04 DIAGNOSIS — K2270 Barrett's esophagus without dysplasia: Secondary | ICD-10-CM | POA: Diagnosis not present

## 2021-01-04 DIAGNOSIS — R296 Repeated falls: Secondary | ICD-10-CM | POA: Diagnosis not present

## 2021-01-04 DIAGNOSIS — R1310 Dysphagia, unspecified: Secondary | ICD-10-CM | POA: Diagnosis not present

## 2021-01-04 DIAGNOSIS — G311 Senile degeneration of brain, not elsewhere classified: Secondary | ICD-10-CM | POA: Diagnosis not present

## 2021-01-04 DIAGNOSIS — Z87440 Personal history of urinary (tract) infections: Secondary | ICD-10-CM | POA: Diagnosis not present

## 2021-01-08 DIAGNOSIS — R1310 Dysphagia, unspecified: Secondary | ICD-10-CM | POA: Diagnosis not present

## 2021-01-08 DIAGNOSIS — Z87440 Personal history of urinary (tract) infections: Secondary | ICD-10-CM | POA: Diagnosis not present

## 2021-01-08 DIAGNOSIS — R296 Repeated falls: Secondary | ICD-10-CM | POA: Diagnosis not present

## 2021-01-08 DIAGNOSIS — K2270 Barrett's esophagus without dysplasia: Secondary | ICD-10-CM | POA: Diagnosis not present

## 2021-01-08 DIAGNOSIS — G311 Senile degeneration of brain, not elsewhere classified: Secondary | ICD-10-CM | POA: Diagnosis not present

## 2021-01-08 DIAGNOSIS — F02818 Dementia in other diseases classified elsewhere, unspecified severity, with other behavioral disturbance: Secondary | ICD-10-CM | POA: Diagnosis not present

## 2021-01-11 DIAGNOSIS — R296 Repeated falls: Secondary | ICD-10-CM | POA: Diagnosis not present

## 2021-01-11 DIAGNOSIS — K2270 Barrett's esophagus without dysplasia: Secondary | ICD-10-CM | POA: Diagnosis not present

## 2021-01-11 DIAGNOSIS — R1310 Dysphagia, unspecified: Secondary | ICD-10-CM | POA: Diagnosis not present

## 2021-01-11 DIAGNOSIS — F02818 Dementia in other diseases classified elsewhere, unspecified severity, with other behavioral disturbance: Secondary | ICD-10-CM | POA: Diagnosis not present

## 2021-01-11 DIAGNOSIS — G311 Senile degeneration of brain, not elsewhere classified: Secondary | ICD-10-CM | POA: Diagnosis not present

## 2021-01-11 DIAGNOSIS — Z87440 Personal history of urinary (tract) infections: Secondary | ICD-10-CM | POA: Diagnosis not present

## 2021-01-15 DIAGNOSIS — G311 Senile degeneration of brain, not elsewhere classified: Secondary | ICD-10-CM | POA: Diagnosis not present

## 2021-01-15 DIAGNOSIS — F02818 Dementia in other diseases classified elsewhere, unspecified severity, with other behavioral disturbance: Secondary | ICD-10-CM | POA: Diagnosis not present

## 2021-01-15 DIAGNOSIS — R296 Repeated falls: Secondary | ICD-10-CM | POA: Diagnosis not present

## 2021-01-15 DIAGNOSIS — Z87440 Personal history of urinary (tract) infections: Secondary | ICD-10-CM | POA: Diagnosis not present

## 2021-01-15 DIAGNOSIS — R1310 Dysphagia, unspecified: Secondary | ICD-10-CM | POA: Diagnosis not present

## 2021-01-15 DIAGNOSIS — K2270 Barrett's esophagus without dysplasia: Secondary | ICD-10-CM | POA: Diagnosis not present

## 2021-01-17 DIAGNOSIS — E559 Vitamin D deficiency, unspecified: Secondary | ICD-10-CM | POA: Diagnosis not present

## 2021-01-17 DIAGNOSIS — Z515 Encounter for palliative care: Secondary | ICD-10-CM | POA: Diagnosis not present

## 2021-01-17 DIAGNOSIS — F5109 Other insomnia not due to a substance or known physiological condition: Secondary | ICD-10-CM | POA: Diagnosis not present

## 2021-01-17 DIAGNOSIS — R635 Abnormal weight gain: Secondary | ICD-10-CM | POA: Diagnosis not present

## 2021-01-18 DIAGNOSIS — K2270 Barrett's esophagus without dysplasia: Secondary | ICD-10-CM | POA: Diagnosis not present

## 2021-01-18 DIAGNOSIS — R1310 Dysphagia, unspecified: Secondary | ICD-10-CM | POA: Diagnosis not present

## 2021-01-18 DIAGNOSIS — F02818 Dementia in other diseases classified elsewhere, unspecified severity, with other behavioral disturbance: Secondary | ICD-10-CM | POA: Diagnosis not present

## 2021-01-18 DIAGNOSIS — R296 Repeated falls: Secondary | ICD-10-CM | POA: Diagnosis not present

## 2021-01-18 DIAGNOSIS — G311 Senile degeneration of brain, not elsewhere classified: Secondary | ICD-10-CM | POA: Diagnosis not present

## 2021-01-18 DIAGNOSIS — Z87440 Personal history of urinary (tract) infections: Secondary | ICD-10-CM | POA: Diagnosis not present

## 2021-01-19 DIAGNOSIS — R1310 Dysphagia, unspecified: Secondary | ICD-10-CM | POA: Diagnosis not present

## 2021-01-19 DIAGNOSIS — K2270 Barrett's esophagus without dysplasia: Secondary | ICD-10-CM | POA: Diagnosis not present

## 2021-01-19 DIAGNOSIS — F02818 Dementia in other diseases classified elsewhere, unspecified severity, with other behavioral disturbance: Secondary | ICD-10-CM | POA: Diagnosis not present

## 2021-01-19 DIAGNOSIS — R296 Repeated falls: Secondary | ICD-10-CM | POA: Diagnosis not present

## 2021-01-19 DIAGNOSIS — G311 Senile degeneration of brain, not elsewhere classified: Secondary | ICD-10-CM | POA: Diagnosis not present

## 2021-01-19 DIAGNOSIS — Z87440 Personal history of urinary (tract) infections: Secondary | ICD-10-CM | POA: Diagnosis not present

## 2021-01-22 DIAGNOSIS — Z87440 Personal history of urinary (tract) infections: Secondary | ICD-10-CM | POA: Diagnosis not present

## 2021-01-22 DIAGNOSIS — R1310 Dysphagia, unspecified: Secondary | ICD-10-CM | POA: Diagnosis not present

## 2021-01-22 DIAGNOSIS — G311 Senile degeneration of brain, not elsewhere classified: Secondary | ICD-10-CM | POA: Diagnosis not present

## 2021-01-22 DIAGNOSIS — F02818 Dementia in other diseases classified elsewhere, unspecified severity, with other behavioral disturbance: Secondary | ICD-10-CM | POA: Diagnosis not present

## 2021-01-22 DIAGNOSIS — R296 Repeated falls: Secondary | ICD-10-CM | POA: Diagnosis not present

## 2021-01-22 DIAGNOSIS — K2270 Barrett's esophagus without dysplasia: Secondary | ICD-10-CM | POA: Diagnosis not present

## 2021-01-23 DIAGNOSIS — R1310 Dysphagia, unspecified: Secondary | ICD-10-CM | POA: Diagnosis not present

## 2021-01-23 DIAGNOSIS — G311 Senile degeneration of brain, not elsewhere classified: Secondary | ICD-10-CM | POA: Diagnosis not present

## 2021-01-23 DIAGNOSIS — R296 Repeated falls: Secondary | ICD-10-CM | POA: Diagnosis not present

## 2021-01-23 DIAGNOSIS — F02818 Dementia in other diseases classified elsewhere, unspecified severity, with other behavioral disturbance: Secondary | ICD-10-CM | POA: Diagnosis not present

## 2021-01-23 DIAGNOSIS — K2270 Barrett's esophagus without dysplasia: Secondary | ICD-10-CM | POA: Diagnosis not present

## 2021-01-23 DIAGNOSIS — Z87440 Personal history of urinary (tract) infections: Secondary | ICD-10-CM | POA: Diagnosis not present

## 2021-01-24 DIAGNOSIS — Z87440 Personal history of urinary (tract) infections: Secondary | ICD-10-CM | POA: Diagnosis not present

## 2021-01-24 DIAGNOSIS — R296 Repeated falls: Secondary | ICD-10-CM | POA: Diagnosis not present

## 2021-01-24 DIAGNOSIS — F02818 Dementia in other diseases classified elsewhere, unspecified severity, with other behavioral disturbance: Secondary | ICD-10-CM | POA: Diagnosis not present

## 2021-01-24 DIAGNOSIS — G311 Senile degeneration of brain, not elsewhere classified: Secondary | ICD-10-CM | POA: Diagnosis not present

## 2021-01-24 DIAGNOSIS — R1310 Dysphagia, unspecified: Secondary | ICD-10-CM | POA: Diagnosis not present

## 2021-01-24 DIAGNOSIS — K2270 Barrett's esophagus without dysplasia: Secondary | ICD-10-CM | POA: Diagnosis not present

## 2021-01-25 DIAGNOSIS — G4700 Insomnia, unspecified: Secondary | ICD-10-CM | POA: Diagnosis not present

## 2021-01-25 DIAGNOSIS — I480 Paroxysmal atrial fibrillation: Secondary | ICD-10-CM | POA: Diagnosis not present

## 2021-01-25 DIAGNOSIS — G250 Essential tremor: Secondary | ICD-10-CM | POA: Diagnosis not present

## 2021-01-25 DIAGNOSIS — R1310 Dysphagia, unspecified: Secondary | ICD-10-CM | POA: Diagnosis not present

## 2021-01-25 DIAGNOSIS — K4090 Unilateral inguinal hernia, without obstruction or gangrene, not specified as recurrent: Secondary | ICD-10-CM | POA: Diagnosis not present

## 2021-01-25 DIAGNOSIS — Z85820 Personal history of malignant melanoma of skin: Secondary | ICD-10-CM | POA: Diagnosis not present

## 2021-01-25 DIAGNOSIS — I10 Essential (primary) hypertension: Secondary | ICD-10-CM | POA: Diagnosis not present

## 2021-01-25 DIAGNOSIS — F02818 Dementia in other diseases classified elsewhere, unspecified severity, with other behavioral disturbance: Secondary | ICD-10-CM | POA: Diagnosis not present

## 2021-01-25 DIAGNOSIS — K219 Gastro-esophageal reflux disease without esophagitis: Secondary | ICD-10-CM | POA: Diagnosis not present

## 2021-01-25 DIAGNOSIS — K2270 Barrett's esophagus without dysplasia: Secondary | ICD-10-CM | POA: Diagnosis not present

## 2021-01-25 DIAGNOSIS — N400 Enlarged prostate without lower urinary tract symptoms: Secondary | ICD-10-CM | POA: Diagnosis not present

## 2021-01-25 DIAGNOSIS — I4510 Unspecified right bundle-branch block: Secondary | ICD-10-CM | POA: Diagnosis not present

## 2021-01-25 DIAGNOSIS — K635 Polyp of colon: Secondary | ICD-10-CM | POA: Diagnosis not present

## 2021-01-25 DIAGNOSIS — Z87440 Personal history of urinary (tract) infections: Secondary | ICD-10-CM | POA: Diagnosis not present

## 2021-01-25 DIAGNOSIS — Z8679 Personal history of other diseases of the circulatory system: Secondary | ICD-10-CM | POA: Diagnosis not present

## 2021-01-25 DIAGNOSIS — R296 Repeated falls: Secondary | ICD-10-CM | POA: Diagnosis not present

## 2021-01-25 DIAGNOSIS — J302 Other seasonal allergic rhinitis: Secondary | ICD-10-CM | POA: Diagnosis not present

## 2021-01-25 DIAGNOSIS — Z8781 Personal history of (healed) traumatic fracture: Secondary | ICD-10-CM | POA: Diagnosis not present

## 2021-01-25 DIAGNOSIS — G311 Senile degeneration of brain, not elsewhere classified: Secondary | ICD-10-CM | POA: Diagnosis not present

## 2021-01-25 DIAGNOSIS — I081 Rheumatic disorders of both mitral and tricuspid valves: Secondary | ICD-10-CM | POA: Diagnosis not present

## 2021-01-25 DIAGNOSIS — Z87442 Personal history of urinary calculi: Secondary | ICD-10-CM | POA: Diagnosis not present

## 2021-01-26 DIAGNOSIS — R296 Repeated falls: Secondary | ICD-10-CM | POA: Diagnosis not present

## 2021-01-26 DIAGNOSIS — F02818 Dementia in other diseases classified elsewhere, unspecified severity, with other behavioral disturbance: Secondary | ICD-10-CM | POA: Diagnosis not present

## 2021-01-26 DIAGNOSIS — G311 Senile degeneration of brain, not elsewhere classified: Secondary | ICD-10-CM | POA: Diagnosis not present

## 2021-01-26 DIAGNOSIS — K2270 Barrett's esophagus without dysplasia: Secondary | ICD-10-CM | POA: Diagnosis not present

## 2021-01-26 DIAGNOSIS — Z87440 Personal history of urinary (tract) infections: Secondary | ICD-10-CM | POA: Diagnosis not present

## 2021-01-26 DIAGNOSIS — R1310 Dysphagia, unspecified: Secondary | ICD-10-CM | POA: Diagnosis not present

## 2021-01-29 DIAGNOSIS — R1310 Dysphagia, unspecified: Secondary | ICD-10-CM | POA: Diagnosis not present

## 2021-01-29 DIAGNOSIS — F02818 Dementia in other diseases classified elsewhere, unspecified severity, with other behavioral disturbance: Secondary | ICD-10-CM | POA: Diagnosis not present

## 2021-01-29 DIAGNOSIS — R296 Repeated falls: Secondary | ICD-10-CM | POA: Diagnosis not present

## 2021-01-29 DIAGNOSIS — G311 Senile degeneration of brain, not elsewhere classified: Secondary | ICD-10-CM | POA: Diagnosis not present

## 2021-01-29 DIAGNOSIS — Z87440 Personal history of urinary (tract) infections: Secondary | ICD-10-CM | POA: Diagnosis not present

## 2021-01-29 DIAGNOSIS — K2270 Barrett's esophagus without dysplasia: Secondary | ICD-10-CM | POA: Diagnosis not present

## 2021-01-31 DIAGNOSIS — F331 Major depressive disorder, recurrent, moderate: Secondary | ICD-10-CM | POA: Diagnosis not present

## 2021-01-31 DIAGNOSIS — F0390 Unspecified dementia without behavioral disturbance: Secondary | ICD-10-CM | POA: Diagnosis not present

## 2021-01-31 DIAGNOSIS — R2689 Other abnormalities of gait and mobility: Secondary | ICD-10-CM | POA: Diagnosis not present

## 2021-01-31 DIAGNOSIS — Z515 Encounter for palliative care: Secondary | ICD-10-CM | POA: Diagnosis not present

## 2021-01-31 DIAGNOSIS — R296 Repeated falls: Secondary | ICD-10-CM | POA: Diagnosis not present

## 2021-02-01 DIAGNOSIS — R1310 Dysphagia, unspecified: Secondary | ICD-10-CM | POA: Diagnosis not present

## 2021-02-01 DIAGNOSIS — F02818 Dementia in other diseases classified elsewhere, unspecified severity, with other behavioral disturbance: Secondary | ICD-10-CM | POA: Diagnosis not present

## 2021-02-01 DIAGNOSIS — Z87440 Personal history of urinary (tract) infections: Secondary | ICD-10-CM | POA: Diagnosis not present

## 2021-02-01 DIAGNOSIS — K2270 Barrett's esophagus without dysplasia: Secondary | ICD-10-CM | POA: Diagnosis not present

## 2021-02-01 DIAGNOSIS — G311 Senile degeneration of brain, not elsewhere classified: Secondary | ICD-10-CM | POA: Diagnosis not present

## 2021-02-01 DIAGNOSIS — R296 Repeated falls: Secondary | ICD-10-CM | POA: Diagnosis not present

## 2021-02-04 DIAGNOSIS — Z87440 Personal history of urinary (tract) infections: Secondary | ICD-10-CM | POA: Diagnosis not present

## 2021-02-04 DIAGNOSIS — R1310 Dysphagia, unspecified: Secondary | ICD-10-CM | POA: Diagnosis not present

## 2021-02-04 DIAGNOSIS — G311 Senile degeneration of brain, not elsewhere classified: Secondary | ICD-10-CM | POA: Diagnosis not present

## 2021-02-04 DIAGNOSIS — R296 Repeated falls: Secondary | ICD-10-CM | POA: Diagnosis not present

## 2021-02-04 DIAGNOSIS — K2270 Barrett's esophagus without dysplasia: Secondary | ICD-10-CM | POA: Diagnosis not present

## 2021-02-04 DIAGNOSIS — F02818 Dementia in other diseases classified elsewhere, unspecified severity, with other behavioral disturbance: Secondary | ICD-10-CM | POA: Diagnosis not present

## 2021-02-05 DIAGNOSIS — G311 Senile degeneration of brain, not elsewhere classified: Secondary | ICD-10-CM | POA: Diagnosis not present

## 2021-02-05 DIAGNOSIS — F02818 Dementia in other diseases classified elsewhere, unspecified severity, with other behavioral disturbance: Secondary | ICD-10-CM | POA: Diagnosis not present

## 2021-02-05 DIAGNOSIS — R1310 Dysphagia, unspecified: Secondary | ICD-10-CM | POA: Diagnosis not present

## 2021-02-05 DIAGNOSIS — Z87440 Personal history of urinary (tract) infections: Secondary | ICD-10-CM | POA: Diagnosis not present

## 2021-02-05 DIAGNOSIS — R296 Repeated falls: Secondary | ICD-10-CM | POA: Diagnosis not present

## 2021-02-05 DIAGNOSIS — K2270 Barrett's esophagus without dysplasia: Secondary | ICD-10-CM | POA: Diagnosis not present

## 2021-02-06 DIAGNOSIS — Z87440 Personal history of urinary (tract) infections: Secondary | ICD-10-CM | POA: Diagnosis not present

## 2021-02-06 DIAGNOSIS — F02818 Dementia in other diseases classified elsewhere, unspecified severity, with other behavioral disturbance: Secondary | ICD-10-CM | POA: Diagnosis not present

## 2021-02-06 DIAGNOSIS — K2270 Barrett's esophagus without dysplasia: Secondary | ICD-10-CM | POA: Diagnosis not present

## 2021-02-06 DIAGNOSIS — R296 Repeated falls: Secondary | ICD-10-CM | POA: Diagnosis not present

## 2021-02-06 DIAGNOSIS — G311 Senile degeneration of brain, not elsewhere classified: Secondary | ICD-10-CM | POA: Diagnosis not present

## 2021-02-06 DIAGNOSIS — R1310 Dysphagia, unspecified: Secondary | ICD-10-CM | POA: Diagnosis not present

## 2021-02-07 DIAGNOSIS — D518 Other vitamin B12 deficiency anemias: Secondary | ICD-10-CM | POA: Diagnosis not present

## 2021-02-07 DIAGNOSIS — K219 Gastro-esophageal reflux disease without esophagitis: Secondary | ICD-10-CM | POA: Diagnosis not present

## 2021-02-07 DIAGNOSIS — I482 Chronic atrial fibrillation, unspecified: Secondary | ICD-10-CM | POA: Diagnosis not present

## 2021-02-07 DIAGNOSIS — Z515 Encounter for palliative care: Secondary | ICD-10-CM | POA: Diagnosis not present

## 2021-02-08 DIAGNOSIS — M79675 Pain in left toe(s): Secondary | ICD-10-CM | POA: Diagnosis not present

## 2021-02-08 DIAGNOSIS — M79674 Pain in right toe(s): Secondary | ICD-10-CM | POA: Diagnosis not present

## 2021-02-08 DIAGNOSIS — R1310 Dysphagia, unspecified: Secondary | ICD-10-CM | POA: Diagnosis not present

## 2021-02-08 DIAGNOSIS — Z87440 Personal history of urinary (tract) infections: Secondary | ICD-10-CM | POA: Diagnosis not present

## 2021-02-08 DIAGNOSIS — B351 Tinea unguium: Secondary | ICD-10-CM | POA: Diagnosis not present

## 2021-02-08 DIAGNOSIS — R296 Repeated falls: Secondary | ICD-10-CM | POA: Diagnosis not present

## 2021-02-08 DIAGNOSIS — F02818 Dementia in other diseases classified elsewhere, unspecified severity, with other behavioral disturbance: Secondary | ICD-10-CM | POA: Diagnosis not present

## 2021-02-08 DIAGNOSIS — K2270 Barrett's esophagus without dysplasia: Secondary | ICD-10-CM | POA: Diagnosis not present

## 2021-02-08 DIAGNOSIS — G311 Senile degeneration of brain, not elsewhere classified: Secondary | ICD-10-CM | POA: Diagnosis not present

## 2021-02-11 DIAGNOSIS — R1310 Dysphagia, unspecified: Secondary | ICD-10-CM | POA: Diagnosis not present

## 2021-02-11 DIAGNOSIS — F02818 Dementia in other diseases classified elsewhere, unspecified severity, with other behavioral disturbance: Secondary | ICD-10-CM | POA: Diagnosis not present

## 2021-02-11 DIAGNOSIS — Z87440 Personal history of urinary (tract) infections: Secondary | ICD-10-CM | POA: Diagnosis not present

## 2021-02-11 DIAGNOSIS — K2270 Barrett's esophagus without dysplasia: Secondary | ICD-10-CM | POA: Diagnosis not present

## 2021-02-11 DIAGNOSIS — G311 Senile degeneration of brain, not elsewhere classified: Secondary | ICD-10-CM | POA: Diagnosis not present

## 2021-02-11 DIAGNOSIS — R296 Repeated falls: Secondary | ICD-10-CM | POA: Diagnosis not present

## 2021-02-12 DIAGNOSIS — F02818 Dementia in other diseases classified elsewhere, unspecified severity, with other behavioral disturbance: Secondary | ICD-10-CM | POA: Diagnosis not present

## 2021-02-12 DIAGNOSIS — R1310 Dysphagia, unspecified: Secondary | ICD-10-CM | POA: Diagnosis not present

## 2021-02-12 DIAGNOSIS — R296 Repeated falls: Secondary | ICD-10-CM | POA: Diagnosis not present

## 2021-02-12 DIAGNOSIS — G311 Senile degeneration of brain, not elsewhere classified: Secondary | ICD-10-CM | POA: Diagnosis not present

## 2021-02-12 DIAGNOSIS — Z87440 Personal history of urinary (tract) infections: Secondary | ICD-10-CM | POA: Diagnosis not present

## 2021-02-12 DIAGNOSIS — K2270 Barrett's esophagus without dysplasia: Secondary | ICD-10-CM | POA: Diagnosis not present

## 2021-02-15 DIAGNOSIS — Z87440 Personal history of urinary (tract) infections: Secondary | ICD-10-CM | POA: Diagnosis not present

## 2021-02-15 DIAGNOSIS — F02818 Dementia in other diseases classified elsewhere, unspecified severity, with other behavioral disturbance: Secondary | ICD-10-CM | POA: Diagnosis not present

## 2021-02-15 DIAGNOSIS — R1310 Dysphagia, unspecified: Secondary | ICD-10-CM | POA: Diagnosis not present

## 2021-02-15 DIAGNOSIS — K2270 Barrett's esophagus without dysplasia: Secondary | ICD-10-CM | POA: Diagnosis not present

## 2021-02-15 DIAGNOSIS — R296 Repeated falls: Secondary | ICD-10-CM | POA: Diagnosis not present

## 2021-02-15 DIAGNOSIS — G311 Senile degeneration of brain, not elsewhere classified: Secondary | ICD-10-CM | POA: Diagnosis not present

## 2021-02-19 DIAGNOSIS — K2270 Barrett's esophagus without dysplasia: Secondary | ICD-10-CM | POA: Diagnosis not present

## 2021-02-19 DIAGNOSIS — R296 Repeated falls: Secondary | ICD-10-CM | POA: Diagnosis not present

## 2021-02-19 DIAGNOSIS — Z87440 Personal history of urinary (tract) infections: Secondary | ICD-10-CM | POA: Diagnosis not present

## 2021-02-19 DIAGNOSIS — R1310 Dysphagia, unspecified: Secondary | ICD-10-CM | POA: Diagnosis not present

## 2021-02-19 DIAGNOSIS — F02818 Dementia in other diseases classified elsewhere, unspecified severity, with other behavioral disturbance: Secondary | ICD-10-CM | POA: Diagnosis not present

## 2021-02-19 DIAGNOSIS — G311 Senile degeneration of brain, not elsewhere classified: Secondary | ICD-10-CM | POA: Diagnosis not present

## 2021-02-22 DIAGNOSIS — R296 Repeated falls: Secondary | ICD-10-CM | POA: Diagnosis not present

## 2021-02-22 DIAGNOSIS — Z87440 Personal history of urinary (tract) infections: Secondary | ICD-10-CM | POA: Diagnosis not present

## 2021-02-22 DIAGNOSIS — K2270 Barrett's esophagus without dysplasia: Secondary | ICD-10-CM | POA: Diagnosis not present

## 2021-02-22 DIAGNOSIS — F02818 Dementia in other diseases classified elsewhere, unspecified severity, with other behavioral disturbance: Secondary | ICD-10-CM | POA: Diagnosis not present

## 2021-02-22 DIAGNOSIS — G311 Senile degeneration of brain, not elsewhere classified: Secondary | ICD-10-CM | POA: Diagnosis not present

## 2021-02-22 DIAGNOSIS — Z20822 Contact with and (suspected) exposure to covid-19: Secondary | ICD-10-CM | POA: Diagnosis not present

## 2021-02-22 DIAGNOSIS — R1310 Dysphagia, unspecified: Secondary | ICD-10-CM | POA: Diagnosis not present

## 2021-02-24 DIAGNOSIS — R1310 Dysphagia, unspecified: Secondary | ICD-10-CM | POA: Diagnosis not present

## 2021-02-24 DIAGNOSIS — K2270 Barrett's esophagus without dysplasia: Secondary | ICD-10-CM | POA: Diagnosis not present

## 2021-02-24 DIAGNOSIS — R296 Repeated falls: Secondary | ICD-10-CM | POA: Diagnosis not present

## 2021-02-24 DIAGNOSIS — Z87440 Personal history of urinary (tract) infections: Secondary | ICD-10-CM | POA: Diagnosis not present

## 2021-02-24 DIAGNOSIS — G311 Senile degeneration of brain, not elsewhere classified: Secondary | ICD-10-CM | POA: Diagnosis not present

## 2021-02-24 DIAGNOSIS — F02818 Dementia in other diseases classified elsewhere, unspecified severity, with other behavioral disturbance: Secondary | ICD-10-CM | POA: Diagnosis not present

## 2023-04-04 IMAGING — CT CT CHEST-ABD-PELV W/ CM
2 of 5 series · 13 of 36 positions shown, 15 images · IV contrast (omnipaque)
Comparison: Cervical, thoracic, and lumbar CT today reported
separately. CT Abdomen and Pelvis 05/02/2016.

CLINICAL DATA: 80-year-old male found down. Unwitnessed fall.

EXAM:
CT CHEST, ABDOMEN, AND PELVIS WITH CONTRAST
TECHNIQUE: Multidetector CT imaging of the chest, abdomen and pelvis was
performed following the standard protocol during bolus
administration of intravenous contrast.
CONTRAST:  75mL OMNIPAQUE IOHEXOL 350 MG/ML SOLN

[Series 3: cap with · axial · 0.79mm/px · z∈[-765,-215]mm · 10 of 136 slices shown, 12 images]
[im 13/136  mediastinal]
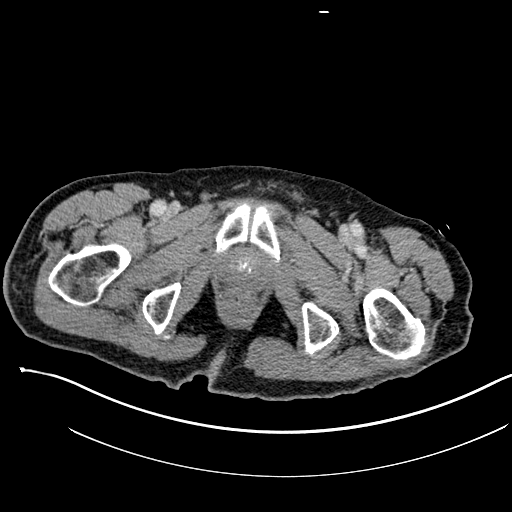
[im 13/136  bone]
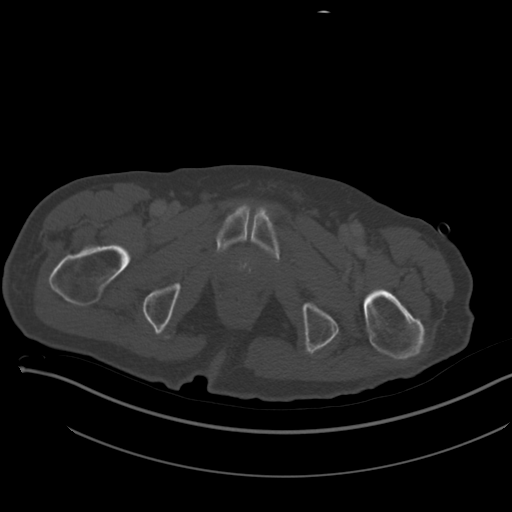
[im 25/136  mediastinal]
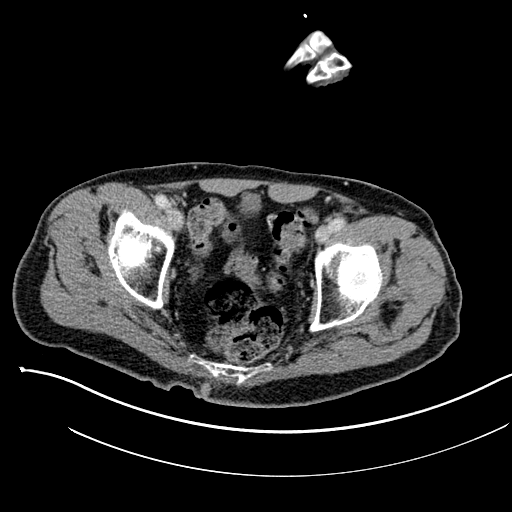
[im 37/136  mediastinal]
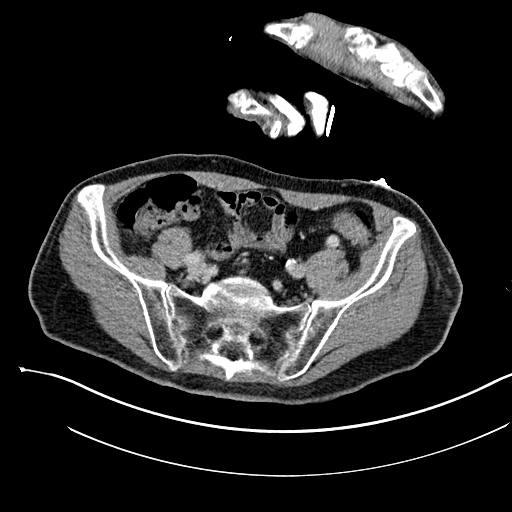
[im 50/136  mediastinal]
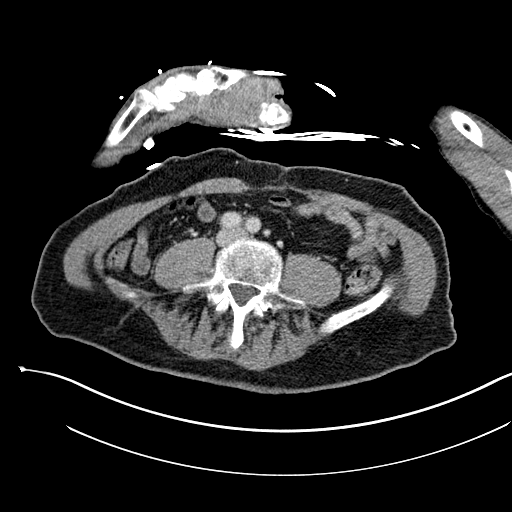
[im 62/136  mediastinal]
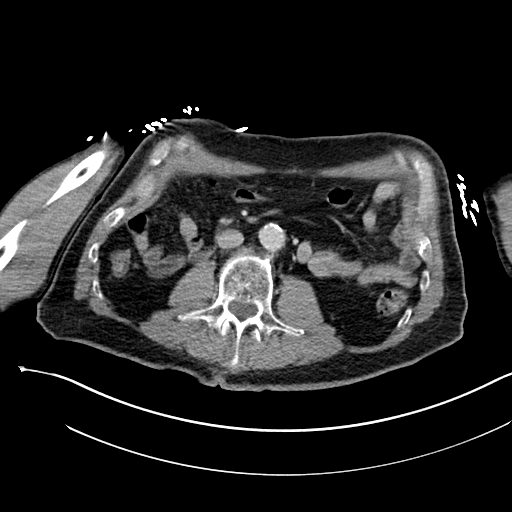
[im 74/136  mediastinal]
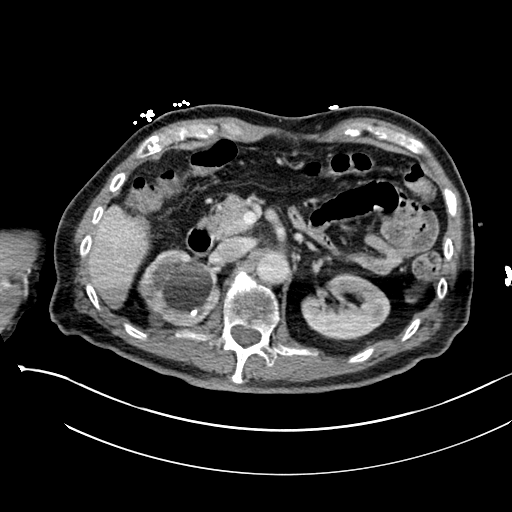
[im 86/136  mediastinal]
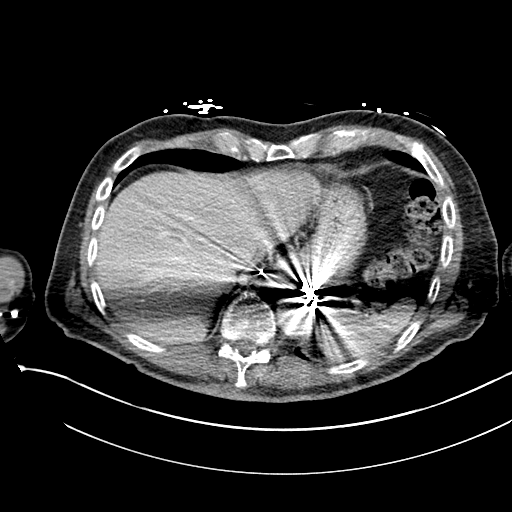
[im 99/136  mediastinal]
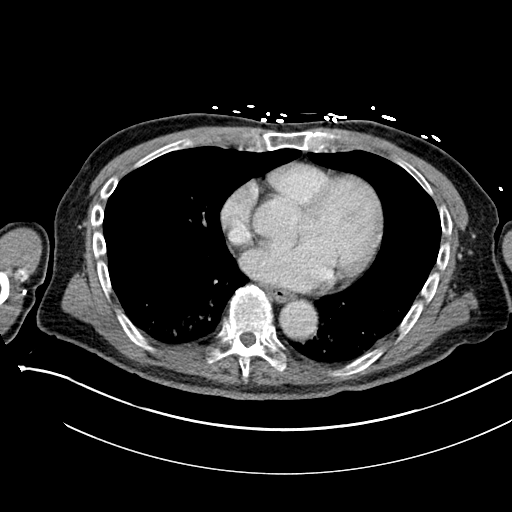
[im 111/136  mediastinal]
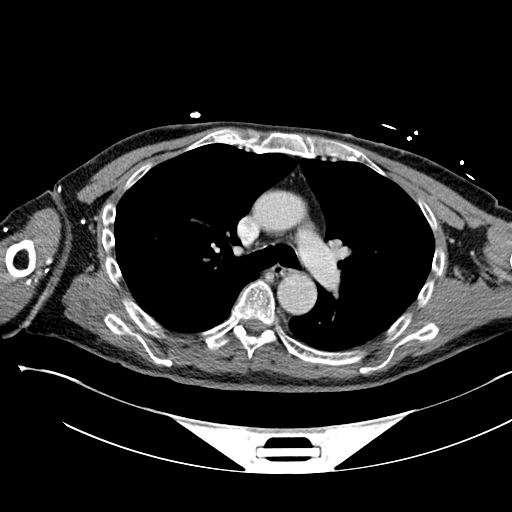
[im 111/136  bone]
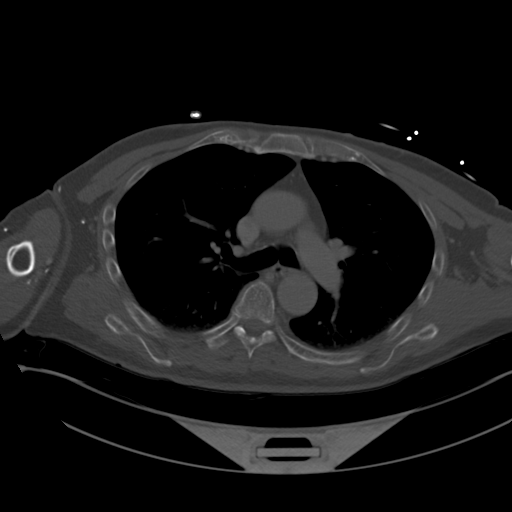
[im 123/136  mediastinal]
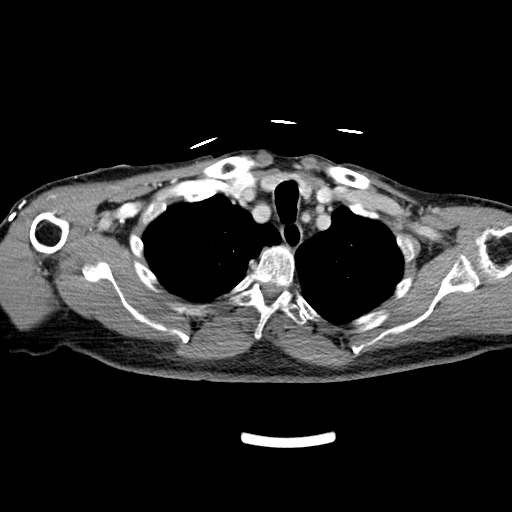

[Series 5: coronals · coronal · 0.66mm/px · 3 of 124 slices shown]
[im 25/124  mediastinal]
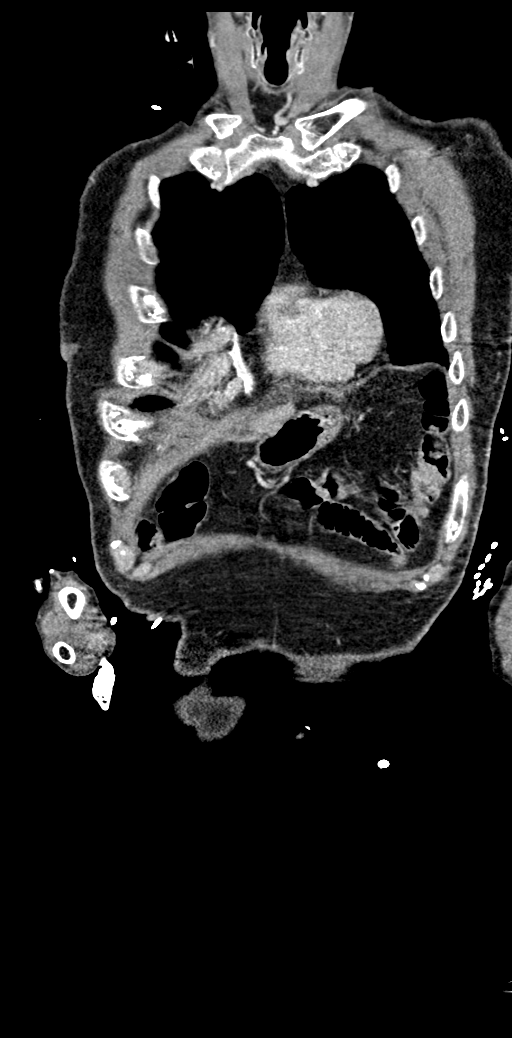
[im 50/124  mediastinal]
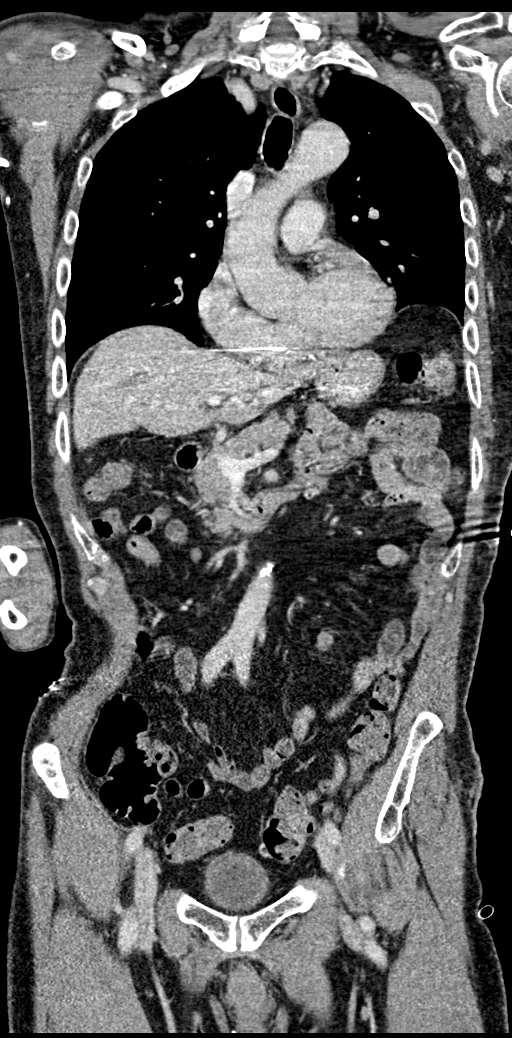
[im 74/124  mediastinal]
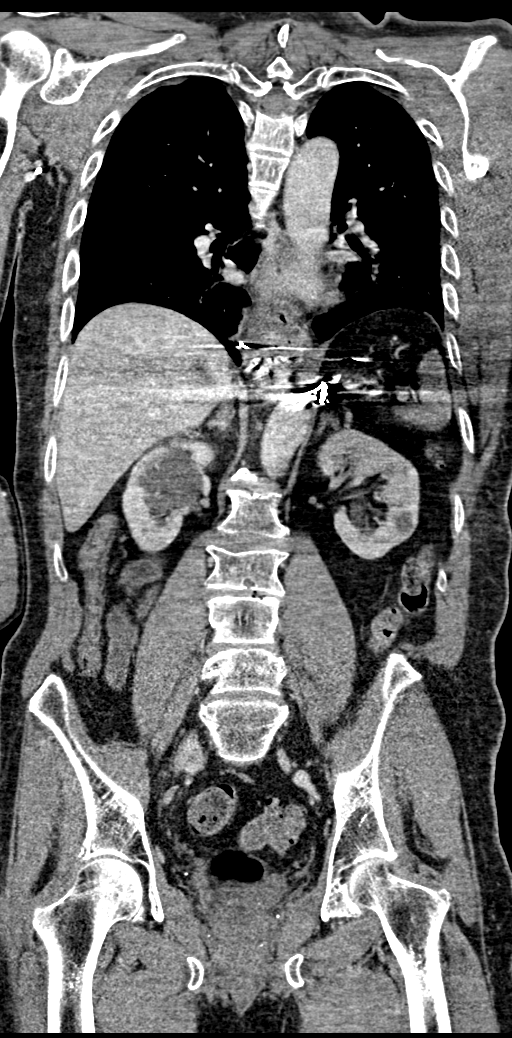

[13 of 36 positions shown; findings below may reference images not displayed]

FINDINGS: CT CHEST FINDINGS

Cardiovascular: Mildly tortuous thoracic aorta. No cardiomegaly or
pericardial effusion. Some calcified coronary artery atherosclerosis
is evident. Minimal thoracic aortic atherosclerosis. The other
central mediastinal vascular structures appear patent and intact.

Mediastinum/Nodes: No mediastinal hematoma or lymphadenopathy. Small
to moderate gastric hiatal hernia with multiple surgical clips
around the gastroesophageal junction and hiatus.

Lungs/Pleura: Major airways are patent. There is mild dependent
atelectasis. No pneumothorax, pleural effusion, or pulmonary
contusion. Aside from the dependent opacity the lungs appear clear.

Musculoskeletal: Thoracic spine is detailed separately.

Osteopenia. There is a minimally displaced fracture of the left 7th
rib anterior costochondral junction best seen on series 3, image 59.
Possible nondisplaced left 6th rib costochondral junction fracture
also.

No other rib fracture identified. Visible shoulder osseous
structures appear intact. Sternum appears intact.

CT ABDOMEN PELVIS FINDINGS

Hepatobiliary: Chronically absent gallbladder.  Negative liver.

Pancreas: Negative.

Spleen: Negative.

Adrenals/Urinary Tract: Normal adrenal glands. Chronic benign renal
cysts. Chronic nephrolithiasis. But no hydronephrosis. Kidneys
enhance and excrete contrast symmetrically. Ureters are
decompressed. Bladder is decompressed and negative.

Stomach/Bowel: Distal large bowel retained stool. Mild
diverticulosis at the junction of the descending and sigmoid colon.
More proximal large bowel is decompressed. Diminutive, normal
appendix suspected on coronal image 57. No large bowel inflammation.
Negative terminal ileum. No dilated small bowel. Hiatal hernia with
chronic surgical clips about the hiatus and stomach. Intra-abdominal
stomach and duodenum appear negative. No free air or free fluid.

Vascular/Lymphatic: Mild Aortoiliac calcified atherosclerosis. Major
arterial structures remain patent. No lymphadenopathy. Patent portal
venous system.

Reproductive: Negative.

Other: No pelvic free fluid.  Chronic pelvic phleboliths.

Musculoskeletal: Lumbar spine is detailed separately. Sacrum, SI
joints, pelvis and proximal femurs appear intact.
IMPRESSION: 1. Minimally displaced fractures of the left 7th and possibly 6th
anterior ribs at the costochondral junctions.
2. No other acute traumatic injury identified in the chest, abdomen,
or pelvis. CT Thoracic and Lumbar Spine are reported separately.
3. Small to moderate gastric hiatal hernia with chronic
postoperative changes about the gastroesophageal junction and
stomach.
4. Nephrolithiasis.
